# Patient Record
Sex: Female | Born: 1990 | Race: Black or African American | Hispanic: No | Marital: Single | Smoking: Never smoker
Health system: Southern US, Community
[De-identification: ages and names within clinical notes are randomized; demographics above are authoritative.]

## PROBLEM LIST (undated history)

## (undated) ENCOUNTER — Inpatient Hospital Stay (HOSPITAL_COMMUNITY): Payer: Self-pay

## (undated) DIAGNOSIS — I10 Essential (primary) hypertension: Secondary | ICD-10-CM

## (undated) DIAGNOSIS — N9489 Other specified conditions associated with female genital organs and menstrual cycle: Secondary | ICD-10-CM

## (undated) DIAGNOSIS — O09299 Supervision of pregnancy with other poor reproductive or obstetric history, unspecified trimester: Secondary | ICD-10-CM

## (undated) DIAGNOSIS — A5901 Trichomonal vulvovaginitis: Secondary | ICD-10-CM

## (undated) DIAGNOSIS — F419 Anxiety disorder, unspecified: Secondary | ICD-10-CM

## (undated) DIAGNOSIS — Z8719 Personal history of other diseases of the digestive system: Secondary | ICD-10-CM

## (undated) DIAGNOSIS — D649 Anemia, unspecified: Secondary | ICD-10-CM

## (undated) DIAGNOSIS — Z8619 Personal history of other infectious and parasitic diseases: Secondary | ICD-10-CM

## (undated) DIAGNOSIS — N390 Urinary tract infection, site not specified: Secondary | ICD-10-CM

## (undated) DIAGNOSIS — A749 Chlamydial infection, unspecified: Secondary | ICD-10-CM

## (undated) DIAGNOSIS — Z87898 Personal history of other specified conditions: Secondary | ICD-10-CM

## (undated) DIAGNOSIS — N83202 Unspecified ovarian cyst, left side: Secondary | ICD-10-CM

## (undated) HISTORY — DX: Supervision of pregnancy with other poor reproductive or obstetric history, unspecified trimester: O09.299

## (undated) HISTORY — DX: Trichomonal vulvovaginitis: A59.01

## (undated) HISTORY — DX: Personal history of other infectious and parasitic diseases: Z86.19

---
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
## 2008-03-09 ENCOUNTER — Emergency Department (HOSPITAL_COMMUNITY): Admission: EM | Admit: 2008-03-09 | Discharge: 2008-03-09 | Payer: Self-pay | Admitting: Emergency Medicine

## 2008-04-07 ENCOUNTER — Emergency Department (HOSPITAL_COMMUNITY): Admission: EM | Admit: 2008-04-07 | Discharge: 2008-04-08 | Payer: Self-pay | Admitting: Emergency Medicine

## 2009-07-16 ENCOUNTER — Emergency Department (HOSPITAL_COMMUNITY): Admission: EM | Admit: 2009-07-16 | Discharge: 2009-07-16 | Payer: Self-pay | Admitting: Family Medicine

## 2010-05-12 ENCOUNTER — Inpatient Hospital Stay (HOSPITAL_COMMUNITY)
Admission: AD | Admit: 2010-05-12 | Discharge: 2010-05-16 | Payer: Self-pay | Source: Home / Self Care | Admitting: Obstetrics & Gynecology

## 2010-05-25 ENCOUNTER — Ambulatory Visit: Payer: Self-pay | Admitting: Obstetrics & Gynecology

## 2010-06-05 ENCOUNTER — Inpatient Hospital Stay (HOSPITAL_COMMUNITY)
Admission: AD | Admit: 2010-06-05 | Discharge: 2010-06-05 | Payer: Self-pay | Source: Home / Self Care | Attending: Obstetrics & Gynecology | Admitting: Obstetrics & Gynecology

## 2010-07-05 ENCOUNTER — Ambulatory Visit (HOSPITAL_COMMUNITY)
Admission: RE | Admit: 2010-07-05 | Discharge: 2010-07-05 | Payer: Medicaid Other | Source: Home / Self Care | Attending: Family Medicine | Admitting: Family Medicine

## 2010-07-10 ENCOUNTER — Inpatient Hospital Stay (HOSPITAL_COMMUNITY)
Admission: AD | Admit: 2010-07-10 | Discharge: 2010-07-10 | Payer: Self-pay | Source: Home / Self Care | Attending: Obstetrics & Gynecology | Admitting: Obstetrics & Gynecology

## 2010-07-11 LAB — URINALYSIS, ROUTINE W REFLEX MICROSCOPIC
Bilirubin Urine: NEGATIVE
Nitrite: NEGATIVE
Specific Gravity, Urine: 1.015 (ref 1.005–1.030)
Urobilinogen, UA: 0.2 mg/dL (ref 0.0–1.0)

## 2010-07-11 LAB — URINE MICROSCOPIC-ADD ON

## 2010-07-11 LAB — POCT PREGNANCY, URINE: Preg Test, Ur: NEGATIVE

## 2010-07-12 ENCOUNTER — Ambulatory Visit: Admit: 2010-07-12 | Payer: Self-pay | Admitting: Obstetrics and Gynecology

## 2010-08-28 LAB — URINALYSIS, ROUTINE W REFLEX MICROSCOPIC
Glucose, UA: NEGATIVE mg/dL
Ketones, ur: NEGATIVE mg/dL
pH: 6 (ref 5.0–8.0)

## 2010-08-28 LAB — URINE MICROSCOPIC-ADD ON

## 2010-08-28 LAB — CBC
MCH: 23.2 pg — ABNORMAL LOW (ref 26.0–34.0)
MCHC: 30.5 g/dL (ref 30.0–36.0)
Platelets: 538 10*3/uL — ABNORMAL HIGH (ref 150–400)
RBC: 3.71 MIL/uL — ABNORMAL LOW (ref 3.87–5.11)
RDW: 16 % — ABNORMAL HIGH (ref 11.5–15.5)

## 2010-08-28 LAB — COMPREHENSIVE METABOLIC PANEL
AST: 13 U/L (ref 0–37)
Albumin: 3.2 g/dL — ABNORMAL LOW (ref 3.5–5.2)
Calcium: 8.7 mg/dL (ref 8.4–10.5)
Creatinine, Ser: 0.55 mg/dL (ref 0.4–1.2)
GFR calc Af Amer: >60 mL/min (ref >60)
Total Protein: 7.7 g/dL (ref 6.0–8.3)

## 2010-08-28 LAB — DIFFERENTIAL
Eosinophils Relative: 1 % (ref 0–5)
Lymphocytes Relative: 19 % (ref 12–46)
Lymphs Abs: 1.9 10*3/uL (ref 0.7–4.0)
Monocytes Absolute: 0.8 10*3/uL (ref 0.1–1.0)
Neutro Abs: 7.5 10*3/uL (ref 1.7–7.7)

## 2010-08-29 LAB — URINE CULTURE
Colony Count: 60000
Culture  Setup Time: 201111260114

## 2010-08-29 LAB — DIFFERENTIAL
Basophils Relative: 0 % (ref 0–1)
Lymphs Abs: 1.1 10*3/uL (ref 0.7–4.0)
Monocytes Absolute: 1.1 10*3/uL — ABNORMAL HIGH (ref 0.1–1.0)
Monocytes Relative: 9 % (ref 3–12)
Neutro Abs: 10.3 10*3/uL — ABNORMAL HIGH (ref 1.7–7.7)
Neutrophils Relative %: 82 % — ABNORMAL HIGH (ref 43–77)

## 2010-08-29 LAB — URINALYSIS, ROUTINE W REFLEX MICROSCOPIC
Bilirubin Urine: NEGATIVE
Hgb urine dipstick: NEGATIVE
Nitrite: NEGATIVE
Specific Gravity, Urine: 1.025 (ref 1.005–1.030)
pH: 5.5 (ref 5.0–8.0)

## 2010-08-29 LAB — URINE MICROSCOPIC-ADD ON

## 2010-08-29 LAB — GC/CHLAMYDIA PROBE AMP, GENITAL
Chlamydia, DNA Probe: NEGATIVE
GC Probe Amp, Genital: NEGATIVE

## 2010-08-29 LAB — CBC
HCT: 28.8 % — ABNORMAL LOW (ref 36.0–46.0)
Hemoglobin: 9.1 g/dL — ABNORMAL LOW (ref 12.0–15.0)
MCHC: 31.6 g/dL (ref 30.0–36.0)
RBC: 3.67 MIL/uL — ABNORMAL LOW (ref 3.87–5.11)

## 2010-08-29 LAB — WET PREP, GENITAL: Trich, Wet Prep: NONE SEEN

## 2010-09-03 LAB — POCT URINALYSIS DIP (DEVICE)
Glucose, UA: NEGATIVE mg/dL
Nitrite: NEGATIVE
Protein, ur: 30 mg/dL — AB
Urobilinogen, UA: 0.2 mg/dL (ref 0.0–1.0)

## 2010-09-03 LAB — URINE CULTURE

## 2010-09-18 ENCOUNTER — Ambulatory Visit: Payer: Self-pay | Admitting: Occupational Therapy

## 2010-11-03 NOTE — H&P (Signed)
NAME:  Amy Mclean, HARRAL             ACCOUNT NO.:  [id number]   MEDICAL RECORD NO.:  [id number]          PATIENT TYPE:  INP   LOCATION:  9166                          FACILITY:  [facility]   PHYSICIAN:  Hal Morales, M.D.DATE OF BIRTH:  November 30, 1990   DATE OF ADMISSION:  04/27/2004  DATE OF DISCHARGE:                                HISTORY & PHYSICAL   HISTORY:  This is a 20 year old gravida 1 para 0 at 40-5/7 weeks who  presents from the office with reported variable decelerations on the monitor  there.  She reports positive fetal movement. She does not feel her  contractions as painful.  She reports no bleeding or leaking.  Pregnancy has  been followed by the nurse midwife service and remarkable for (1)  adolescent, (2) unsure LMP, (3) late to care, (4) Chlamydia, (5) group B  strep negative.   OBSTETRICAL HISTORY:  Patient is a primigravida.   MEDICAL HISTORY:  Childhood varicella.   SURGICAL HISTORY:  Negative.   FAMILY HISTORY:  Grandfather with hypertension and diabetes.  Grandmother  with arthritis.   GENETIC HISTORY:  Genetic history is remarkable for patient's age of 80 and  father of the baby's age of 75.   SOCIAL HISTORY:  Patient is single.  Father of the baby is not involved.  Mother and aunt and sister are present.  She is of the [employer].  She  denies any alcohol, tobacco, or drug use.  She does not work.  She goes to  homebound school.   HISTORY OF CURRENT PREGNANCY:  Patient entered care at [redacted] weeks gestation.  She was diagnosed with Chlamydia at that time and treated with Zithromax.  She had a negative test of cure.  Ultrasound was normal.  She had another  ultrasound late in pregnancy for size less than dates at 38 weeks and growth  was 49-52% with an AFI of 13.9.   OBJECTIVE DATA:  VITAL SIGNS:  Stable, afebrile.  HEENT:  Within normal limits.  THYROID:  Normal, not enlarged.  CHEST:  Clear to auscultation.  HEART RATE:  Regular rate and rhythm.  ABDOMEN:  Gravid at 39 cm, vertex to Leopold's and vertex to bedside  ultrasound.  EFM shows fetal heart rate of 135 with accelerations to 140-  150, nonreactive by criteria, variable decelerations occurred x2 to 95-105  spontaneously, there are contractions q.2-65m. which are mild.  CERVICAL:  Cervical exam is extremely difficult and is questionably closed,  50% effaced, bedside ultrasound shows a vertex ROA to ROT position with  positive fetal movement.  EXTREMITIES:  Within normal limits.   ASSESSMENT:  1.  Intrauterine pregnancy at 40-5/7 weeks.  2.  Prodromal contractions.  3.  Variable decelerations.   PLAN:  Discuss with Dr. Pennie Rushing.  We will admit the patient to birthing  suites.  Routine CNM orders and Pitocin induction was recommended with a  review of the risks and benefits with the patient and her family.  They  agree to proceed.     Mari   MLW/MEDQ  D:  04/27/2004  T:  04/27/2004  Job:  [id number]

## 2010-11-03 NOTE — Discharge Summary (Signed)
NAME:  Amy Mclean, Amy Mclean NO.:  [id number]   MEDICAL RECORD NO.:  [id number]          PATIENT TYPE:  INP   LOCATION:  9122                          [facility]ACILITY:  [facility]   PHYSICIAN:  Janine Limbo, M.D.DATE OF BIRTH:  05/14/1991   DATE OF ADMISSION:  04/27/2004  DATE OF DISCHARGE:  04/30/2004                                 DISCHARGE SUMMARY   ADMISSION DIAGNOSES:  1.  Intrauterine pregnancy at term.  2.  Prodromal labor.  3.  Fetal heart rate with variable decelerations.   PREOPERATIVE DIAGNOSES:  Nonreassuring fetal heart rate tracing.   PROCEDURE:  Vacuum assisted vaginal delivery and repair of midline  episiotomy.   DISCHARGE DIAGNOSES:  1.  Intrauterine pregnancy at term.  2.  Prodromal labor.  3.  Fetal heart rate with variable decelerations.  4.  Leg cord.   [name] is a 20 year old, gravida 1, para 0 who presented to [facility] at 40 5/7 weeks from the office of [facility] with variable decelerations  on the NST monitor there. The patient was contracting approximately every 2-  6 minutes but was not uncomfortable on admission.  Fetal heart rate remained  nonreactive with good variability, occasional decelerations throughout her  labor.  Epidural was placed when contractions became uncomfortable and  artificial rupture of membranes was  employed for light meconium stained  amniotic fluid, internal monitoring was initiated.  Pitocin had been turned  off secondary to nonreassuring fetal heart rate; however, at this point, the  patient was in the active phase of labor and continued to dilate. She became  complete at which point in light of fetal heart rate tracing, vacuum  assisted vaginal delivery was offered and accepted by the patient.  Vacuum  assistance was performed by Dr. Dierdre Forth, see operative delivery  report.  Birth was accomplished of a 7 pound 3 ounce female infant names  Jess Barters with Apgar scores of 9 at 1 minute and 9 at 5 minutes.  The patient  has done well in the postpartum period. Her hemostasis on the first  postpartum day is 9.0, her vital signs have remained stable, she is afebrile  and on this her second postpartum day she is judged to be in satisfactory  condition for discharge.  Discharge instructions per [facility]  handout.   DISCHARGE MEDICATIONS:  Motrin in suspension form as patient cannot swallow  pills. She will continue Flintstone vitamins 2 a day and will receive a Depo-  Provera 150 mg IM prior to discharge.   FOLLOW UP:  Her discharge followup will be at [facility] in six weeks.     Pecolia Ades  SDM/MEDQ  D:  04/30/2004  T:  05/01/2004  Job:  [id number]

## 2011-01-03 ENCOUNTER — Ambulatory Visit: Payer: Self-pay | Admitting: Obstetrics & Gynecology

## 2011-03-19 LAB — URINE CULTURE

## 2011-03-19 LAB — URINE MICROSCOPIC-ADD ON

## 2011-03-19 LAB — URINALYSIS, ROUTINE W REFLEX MICROSCOPIC
Glucose, UA: NEGATIVE
Hgb urine dipstick: NEGATIVE
Specific Gravity, Urine: 1.029

## 2011-12-20 IMAGING — US US PELVIS COMPLETE MODIFY
1 series · 13 of 25 positions shown · non-contrast
Comparison: None.

CLINICAL DATA: Pelvic pain and fever.  History of PID.  Negative
pregnancy test.



[Series 1: us pelvis complete modify · 0.24mm/px · 91 acquisitions, 13 frames shown]
[im 1/91]
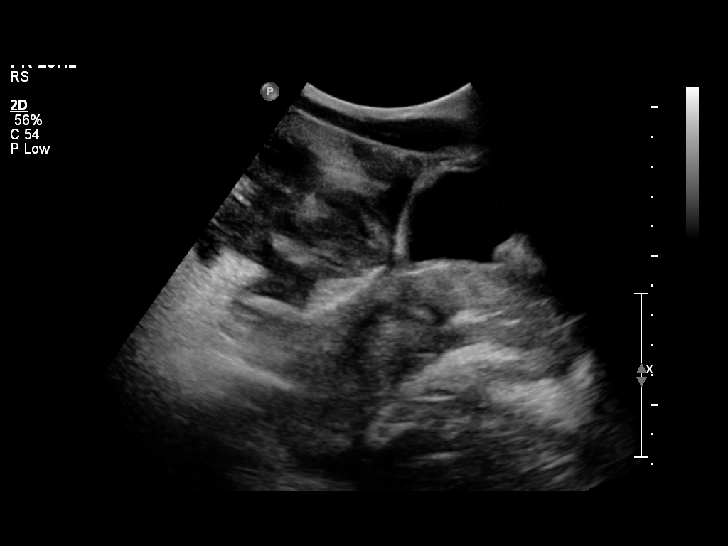
[im 8/91]
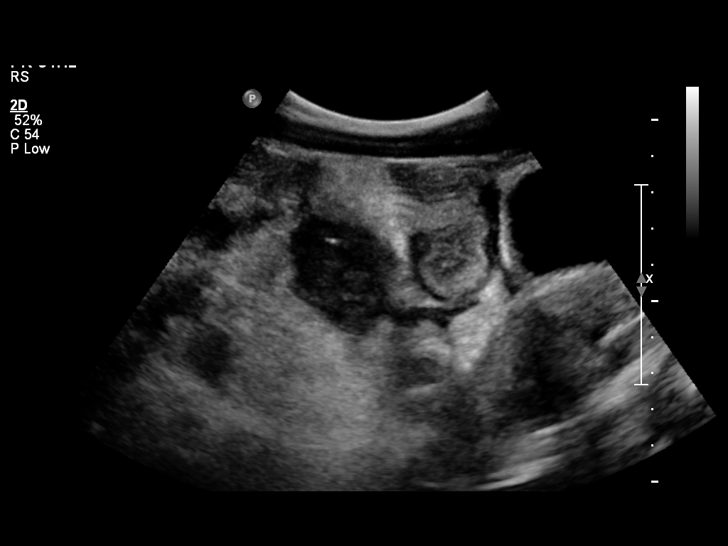
[im 16/91]
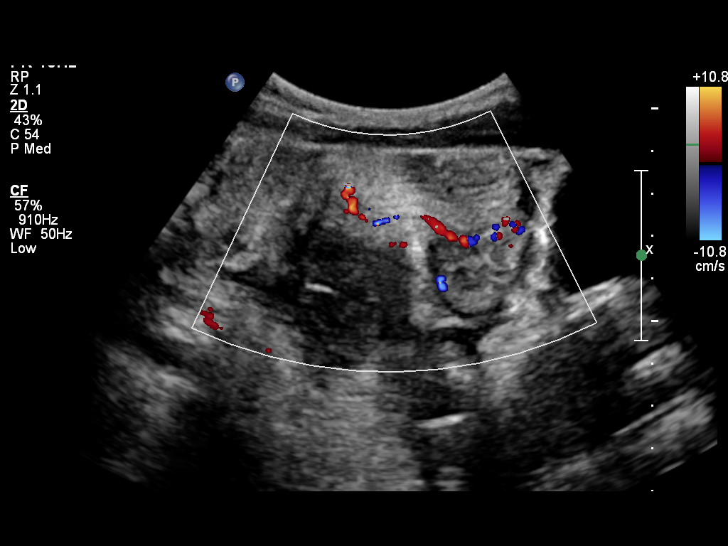
[im 23/91]
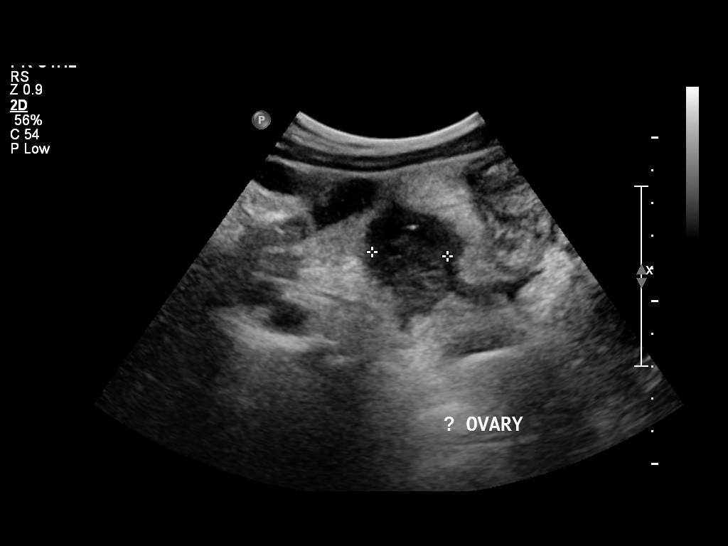
[im 31/91]
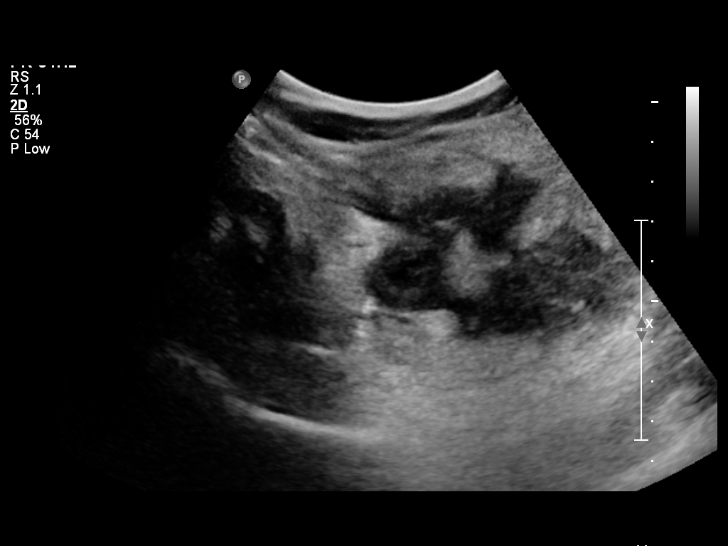
[im 38/91]
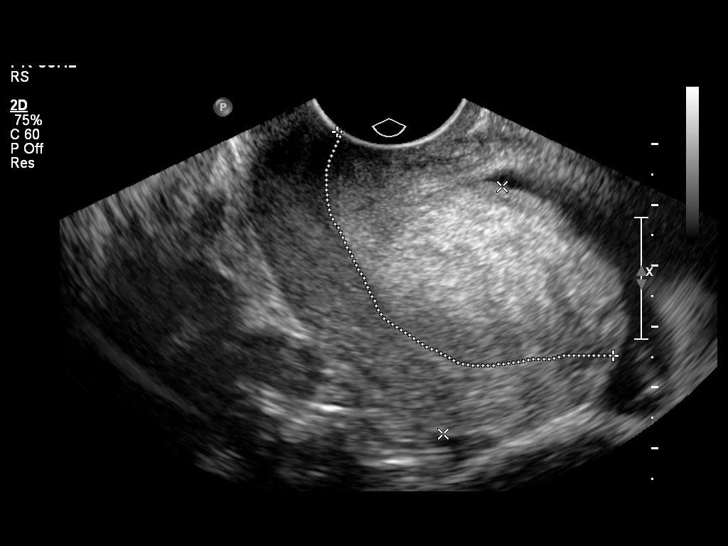
[im 46/91]
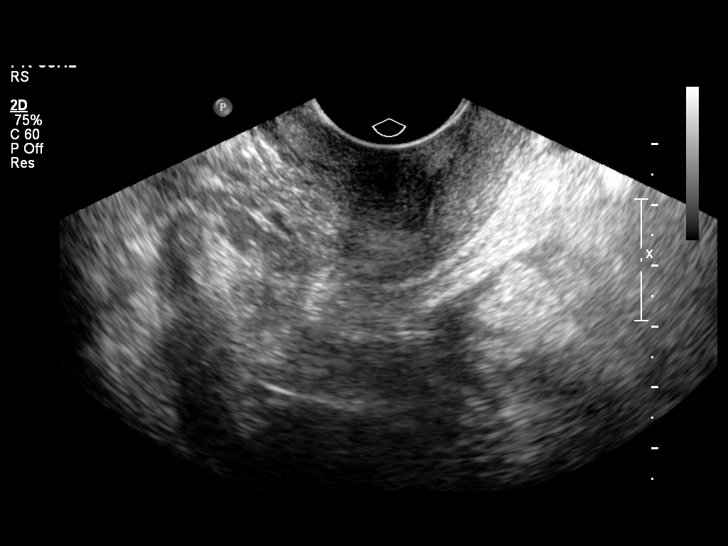
[im 53/91]
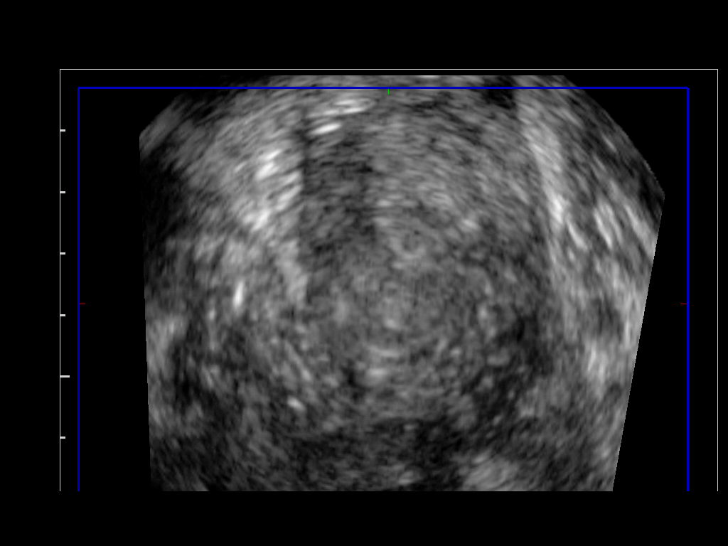
[im 61/91]
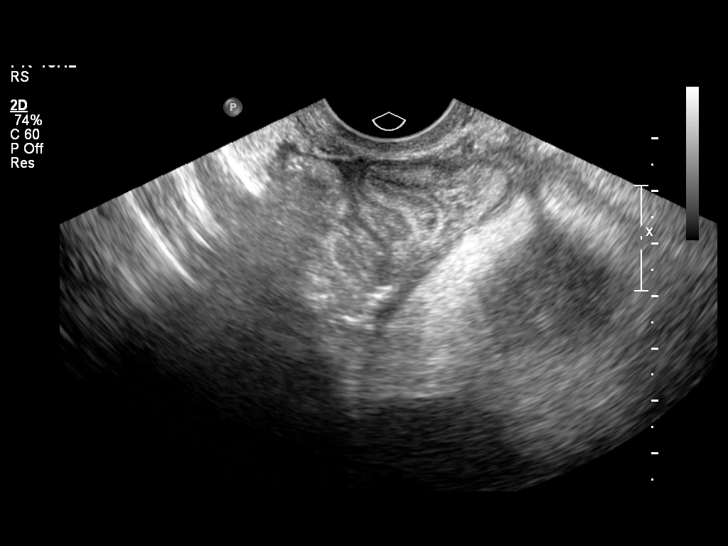
[im 68/91]
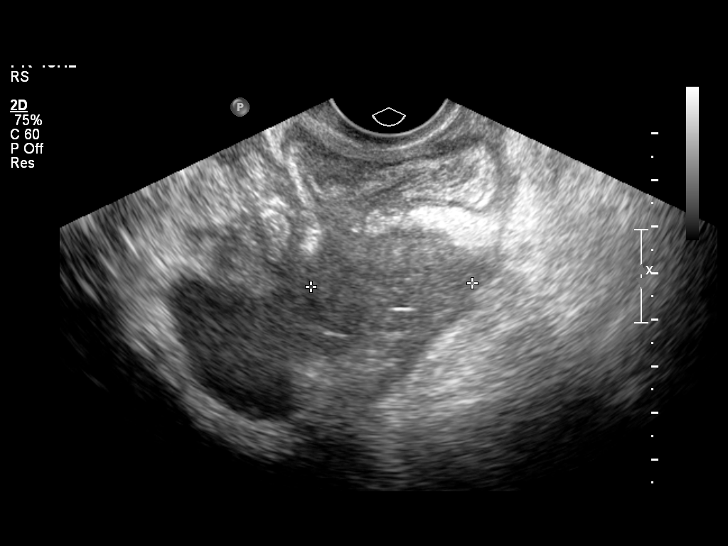
[im 76/91]
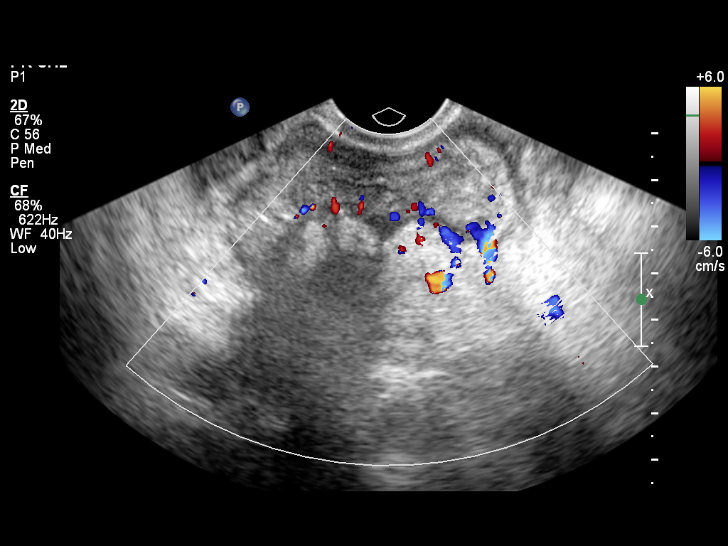
[im 83/91]
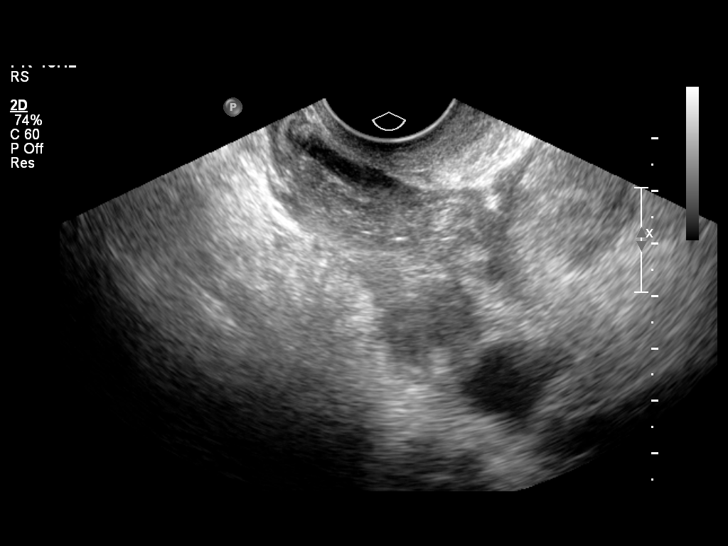
[im 91/91]
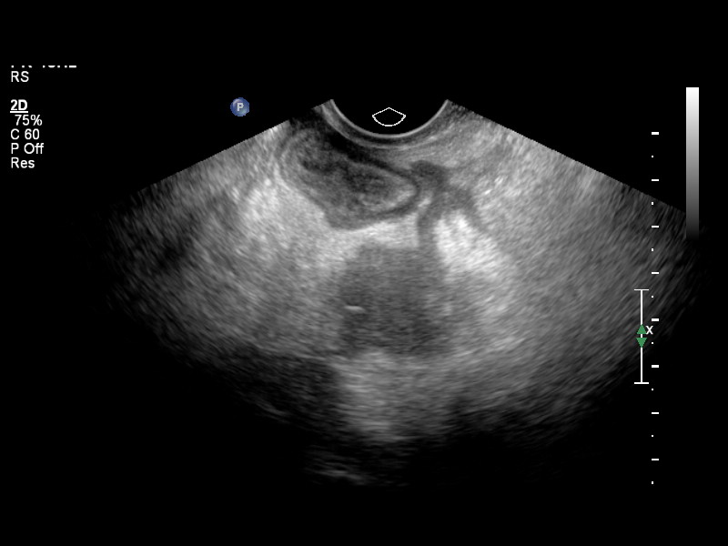

[13 of 25 positions shown; findings below may reference images not displayed]

FINDINGS: Uterus 7.4 x 4.2 x 5.1 cm.  No focal abnormality.

Endometrium 6.4 mm

Right Ovary not visualized

Left Ovary 3.1 x 1.6 x 2.6 cm.

Other Findings:  Complex cystic mass fills much of the pelvis.
This measures 11 x 6.6 x 12 cm and is located above the uterus.
The mass is cystic with multiple areas of increased echogenicity.
This is most likely an abscess related to PID, given the clinical
history.  If this does not respond to antibiotics, follow-up CT
scanning may be helpful.  Underlying neoplasm is considered less
likely given the history of fever.
IMPRESSION: Large complex cystic mass fills much of the pelvis.  This most
likely is an abscess related to pelvic inflammatory disease.
Neoplasm considered less likely.

## 2012-01-10 ENCOUNTER — Encounter (HOSPITAL_COMMUNITY): Payer: Self-pay | Admitting: *Deleted

## 2012-01-10 ENCOUNTER — Emergency Department (HOSPITAL_COMMUNITY): Payer: Self-pay

## 2012-01-10 ENCOUNTER — Emergency Department (HOSPITAL_COMMUNITY)
Admission: EM | Admit: 2012-01-10 | Discharge: 2012-01-10 | Disposition: A | Discharge status: Home / Self Care | Payer: Self-pay | Attending: Emergency Medicine | Admitting: Emergency Medicine

## 2012-01-10 DIAGNOSIS — Y92009 Unspecified place in unspecified non-institutional (private) residence as the place of occurrence of the external cause: Secondary | ICD-10-CM | POA: Insufficient documentation

## 2012-01-10 DIAGNOSIS — W08XXXA Fall from other furniture, initial encounter: Secondary | ICD-10-CM | POA: Insufficient documentation

## 2012-01-10 DIAGNOSIS — S93409A Sprain of unspecified ligament of unspecified ankle, initial encounter: Secondary | ICD-10-CM | POA: Insufficient documentation

## 2012-01-10 MED ORDER — IBUPROFEN 800 MG PO TABS
800.0000 mg | ORAL_TABLET | Freq: Once | ORAL | Status: AC
  Administered 2012-01-10: 800 mg via ORAL
  Filled 2012-01-10: qty 1

## 2012-01-10 NOTE — ED Provider Notes (Signed)
History     CSN: [id number]  Arrival date & time 01/10/12  2150   First MD Initiated Contact with Patient 01/10/12 2302      Chief Complaint  Patient presents with  . Ankle Pain    (Consider location/radiation/quality/duration/timing/severity/associated sxs/prior treatment) Patient is a 21 y.o. female presenting with ankle pain. The history is provided by the patient.  Ankle Pain  Pertinent negatives include no numbness.   Patient presents to emergency department complaining of left ankle injury one week ago stating that she was jumping off the couch and twisted her ankle. Patient states that she initially had some swelling that has been waxing and waning over last week. Patient states that pain is aggravated by prolonged standing or walking and will improve with rest. Patient states that the more she walks throughout the week the more the swelling and pain increases. Patient is complaining of pain in the lateral aspect of right ankle. Patient has taken nothing for pain prior to arrival. Patient denies additional injury. Patient states she has no known medical problems takes no medicines on regular basis she denies extremity numbness/tingling/or weakness. History reviewed. No pertinent past medical history.  History reviewed. No pertinent past surgical history.  No family history on file.  History  Substance Use Topics  . Smoking status: Never Smoker   . Smokeless tobacco: Not on file  . Alcohol Use: No    OB History    Grav Para Term Preterm Abortions TAB SAB Ect Mult Living                  Review of Systems  Constitutional: Negative for fever.  Musculoskeletal: Positive for joint swelling and arthralgias.  Skin: Negative for color change and wound.  Neurological: Negative for weakness and numbness.    Allergies  Review of patient's allergies indicates no known allergies.  Home Medications  No current outpatient prescriptions on file.  BP 114/70  Pulse 98  Temp  98.3 F (36.8 C)  Resp 20  SpO2 100%  LMP 12/21/2011  Physical Exam  Constitutional: She is oriented to person, place, and time. She appears well-developed and well-nourished. No distress.  HENT:  Head: Normocephalic and atraumatic.  Eyes: Conjunctivae are normal.  Cardiovascular: Normal rate and regular rhythm.   Pulmonary/Chest: Effort normal.  Musculoskeletal: She exhibits edema and tenderness.       Right ankle: She exhibits swelling. tenderness.       Swelling and TTP of left lateral ankle but no TTP or swelling of fore foot or calf. No break in skin. Good pedal pulse and cap refill of all toes. Wiggling toes without difficulty.   Neurological: She is alert and oriented to person, place, and time.       Normal sensation of entire foot.   Skin: Skin is warm and dry. No rash noted. She is not diaphoretic. No erythema. No pallor.  Psychiatric: She has a normal mood and affect. Her behavior is normal.    ED Course  Procedures (including critical care time)  PO ibuprofen.   ASO and crutches.   Labs Reviewed - No data to display Dg Ankle Complete Left  01/10/2012  *RADIOLOGY REPORT*  Clinical Data: Ankle pain since injury last weekend.  LEFT ANKLE COMPLETE - 3+ VIEW  Comparison: None.  Findings: The mineralization and alignment are normal.  There is no evidence of acute fracture or dislocation.  No soft tissue abnormalities are identified.  IMPRESSION: No acute osseous findings.  Original Report  Authenticated By: Gerrianne Scale, M.D.     1. Ankle sprain       MDM  Swelling and TTP of left lateral ankle but no TTP or swelling of fore foot or calf. No break in skin. Good pedal pulse and cap refill of all toes. Wiggling toes without difficulty.  No acute findings on xray.          [place], [place] 01/10/12 [phone]

## 2012-01-10 NOTE — ED Provider Notes (Signed)
Medical screening examination/treatment/procedure(s) were performed by non-physician practitioner and as supervising physician I was immediately available for consultation/collaboration.   Benny Lennert, MD 01/10/12 [phone]

## 2012-01-10 NOTE — ED Notes (Signed)
Pt hurt left ankle last wk; increased pain with ambulation/swelling x 2 days

## 2012-02-02 ENCOUNTER — Encounter (HOSPITAL_COMMUNITY): Payer: Self-pay | Admitting: *Deleted

## 2012-02-02 ENCOUNTER — Emergency Department (HOSPITAL_COMMUNITY)
Admission: EM | Admit: 2012-02-02 | Discharge: 2012-02-02 | Disposition: A | Discharge status: Home / Self Care | Payer: Self-pay | Attending: Emergency Medicine | Admitting: Emergency Medicine

## 2012-02-02 DIAGNOSIS — H18829 Corneal disorder due to contact lens, unspecified eye: Secondary | ICD-10-CM | POA: Insufficient documentation

## 2012-02-02 MED ORDER — TETRACAINE HCL 0.5 % OP SOLN
1.0000 [drp] | Freq: Once | OPHTHALMIC | Status: AC
  Administered 2012-02-02: 1 [drp] via OPHTHALMIC
  Filled 2012-02-02: qty 2

## 2012-02-02 MED ORDER — FLUORESCEIN SODIUM 1 MG OP STRP
1.0000 | ORAL_STRIP | Freq: Once | OPHTHALMIC | Status: AC
  Administered 2012-02-02: 1 via OPHTHALMIC
  Filled 2012-02-02: qty 1

## 2012-02-02 NOTE — ED Notes (Signed)
Pt reports redness and irritation to left eye. Pt reports initially her right eye was red and irritated, but has since improved.  Pt reports discharge from left eye and eye is crusted over in AM when she gets up.  Pt denies vision changes.

## 2012-02-02 NOTE — ED Provider Notes (Signed)
History     CSN: [id number]  Arrival date & time 02/02/12  1751   First MD Initiated Contact with Patient 02/02/12 2107      Chief Complaint  Patient presents with  . Conjunctivitis    (Consider location/radiation/quality/duration/timing/severity/associated sxs/prior treatment) HPI Comments: Pt is a contact lens wearer that presents to the ER w cc of left eye irritation. She states that she left her contacts in her eyes x 1 mo with out changing them bc she "kept forgetting." She has been using OTC clear eyes for the irritation which has helped her right eye, but not the left. Associated s/s include blurred vision, injected sclera, FB sensation, clear eye tearing, photophobia, and eye crusting in the morning. No recent trauma, fever, NS or chills.    Patient is a 21 y.o. female presenting with conjunctivitis. The history is provided by the patient.  Conjunctivitis  Associated symptoms include eye itching, photophobia and eye redness. Pertinent negatives include no fever, no abdominal pain, no congestion and no headaches.    History reviewed. No pertinent past medical history.  History reviewed. No pertinent past surgical history.  No family history on file.  History  Substance Use Topics  . Smoking status: Never Smoker   . Smokeless tobacco: Not on file  . Alcohol Use: No    OB History    Grav Para Term Preterm Abortions TAB SAB Ect Mult Living                  Review of Systems  Constitutional: Negative for fever, chills and appetite change.  HENT: Negative for congestion.   Eyes: Positive for photophobia, redness, itching and visual disturbance.  Respiratory: Negative for shortness of breath.   Cardiovascular: Negative for chest pain and leg swelling.  Gastrointestinal: Negative for abdominal pain.  Genitourinary: Negative for dysuria, urgency and frequency.  Neurological: Negative for dizziness, syncope, weakness, light-headedness, numbness and headaches.    Psychiatric/Behavioral: Negative for confusion.  All other systems reviewed and are negative.    Allergies  Review of patient's allergies indicates no known allergies.  Home Medications  No current outpatient prescriptions on file.  BP 111/72  Pulse 86  Temp 98.4 F (36.9 C) (Oral)  Resp 16  Ht 5\' 7"  (1.702 m)  Wt 130 lb (58.968 kg)  BMI 20.36 kg/m2  SpO2 100%  LMP 01/14/2012  Physical Exam  Nursing note and vitals reviewed. Constitutional: She is oriented to person, place, and time. She appears well-developed and well-nourished. No distress.  HENT:  Head: Normocephalic and atraumatic.  Eyes: Conjunctivae and EOM are normal. Pupils are equal, round, and reactive to light. No foreign body present in the left eye.       No tenderness to palpation over temporal arteries or orbital region. Pain free EOMs, visual acuity equal bilaterally (20/50-not wearing glasses), no increase in IOPs, no proptosis, lid swelling, hyphema, purulent discharge from eyes, or consensual photophobia.  Eyelids everted, no evidence of FB.  Fluorescein study: 3mm corneal uptake, no dendritic pattern  Neck: Normal range of motion. Neck supple. Normal carotid pulses and no JVD present. Carotid bruit is not present. No rigidity. Normal range of motion present.  Cardiovascular: Normal rate, regular rhythm, S1 normal, S2 normal, normal heart sounds, intact distal pulses and normal pulses.  Exam reveals no gallop and no friction rub.   No murmur heard. Pulmonary/Chest: Effort normal and breath sounds normal. No accessory muscle usage or stridor. No respiratory distress. She exhibits no tenderness and  no bony tenderness.  Abdominal: Bowel sounds are normal.          Neurological: She is alert and oriented to person, place, and time.  Skin: Skin is warm, dry and intact. No rash noted. She is not diaphoretic. No cyanosis. Nails show no clubbing.  Psychiatric: Her behavior is normal.    ED Course  Procedures  (including critical care time)  Labs Reviewed - No data to display No results found.   No diagnosis found.    MDM  Contact Corneal abrasion Pt with corneal abrasion on PE. Tdap not given, UTD. Eye irrigated w NS, no evidence of FB.  No change in vision, acuity equal bilaterally.  Pt is a contact lens wearer.  Exam non-concerning for orbital cellulitis, hyphema, corneal ulcers. Patient will be discharged home with tobramycin. Patient understands to follow up with ophthalmology (will call first thing tmw am), & to return to ER if new symptoms develop including change in vision, purulent drainage, or entrapment.          Jaci Carrel, [place] 02/02/12 2230

## 2012-02-02 NOTE — ED Notes (Signed)
Pt c/o L eye tenderness and and redness. Pt states she left contact lenses in for 1-2 months without removing them, pt states 5 days ago, drainage and redness began.

## 2012-02-02 NOTE — ED Notes (Signed)
Pt came to triage window and wanted to know if we had called her, I informed her yes,  She said she was in her car charging her cell phone

## 2012-02-02 NOTE — ED Notes (Signed)
Called to triage room x 1,  Went outside and called also,  No answer

## 2012-02-02 NOTE — ED Provider Notes (Signed)
Medical screening examination/treatment/procedure(s) were performed by non-physician practitioner and as supervising physician I was immediately available for consultation/collaboration.   Corianne Buccellato Y. Quinzell Malcomb, MD 02/02/12 2309 

## 2012-07-10 ENCOUNTER — Encounter (HOSPITAL_COMMUNITY): Payer: Self-pay | Admitting: *Deleted

## 2012-07-10 ENCOUNTER — Inpatient Hospital Stay (HOSPITAL_COMMUNITY)
Admission: AD | Admit: 2012-07-10 | Discharge: 2012-07-11 | Disposition: A | Discharge status: Home / Self Care | Payer: BC Managed Care – PPO | Source: Ambulatory Visit | Attending: Obstetrics & Gynecology | Admitting: Obstetrics & Gynecology

## 2012-07-10 DIAGNOSIS — A499 Bacterial infection, unspecified: Secondary | ICD-10-CM | POA: Insufficient documentation

## 2012-07-10 DIAGNOSIS — B9689 Other specified bacterial agents as the cause of diseases classified elsewhere: Secondary | ICD-10-CM | POA: Insufficient documentation

## 2012-07-10 DIAGNOSIS — B3731 Acute candidiasis of vulva and vagina: Secondary | ICD-10-CM | POA: Insufficient documentation

## 2012-07-10 DIAGNOSIS — R109 Unspecified abdominal pain: Secondary | ICD-10-CM | POA: Insufficient documentation

## 2012-07-10 DIAGNOSIS — N390 Urinary tract infection, site not specified: Secondary | ICD-10-CM | POA: Insufficient documentation

## 2012-07-10 DIAGNOSIS — B373 Candidiasis of vulva and vagina: Secondary | ICD-10-CM | POA: Insufficient documentation

## 2012-07-10 DIAGNOSIS — N760 Acute vaginitis: Secondary | ICD-10-CM | POA: Insufficient documentation

## 2012-07-10 LAB — URINALYSIS, ROUTINE W REFLEX MICROSCOPIC
Bilirubin Urine: NEGATIVE
Glucose, UA: NEGATIVE mg/dL
Hgb urine dipstick: NEGATIVE
Specific Gravity, Urine: >1.03 — ABNORMAL HIGH (ref 1.005–1.030)
pH: 6 (ref 5.0–8.0)

## 2012-07-10 LAB — WET PREP, GENITAL: Trich, Wet Prep: NONE SEEN

## 2012-07-10 LAB — URINE MICROSCOPIC-ADD ON

## 2012-07-10 MED ORDER — SULFAMETHOXAZOLE-TRIMETHOPRIM 800-160 MG PO TABS: 1.0000 | ORAL_TABLET | Freq: Two times a day (BID) | ORAL | Status: DC

## 2012-07-10 MED ORDER — FLUCONAZOLE 150 MG PO TABS
150.0000 mg | ORAL_TABLET | ORAL | Status: AC
  Administered 2012-07-10: 150 mg via ORAL
  Filled 2012-07-10: qty 1

## 2012-07-10 MED ORDER — FLUCONAZOLE 150 MG PO TABS: ORAL_TABLET | ORAL | Status: DC

## 2012-07-10 NOTE — Progress Notes (Signed)
Pt informed that NP would be in shortly.

## 2012-07-10 NOTE — MAU Note (Signed)
Pt states when she passes urine it has an odor to it, and pee is cloudy.

## 2012-07-11 LAB — GC/CHLAMYDIA PROBE AMP: CT Probe RNA: NEGATIVE

## 2012-07-11 MED ORDER — METRONIDAZOLE 500 MG PO TABS: 500.0000 mg | ORAL_TABLET | Freq: Two times a day (BID) | ORAL | Status: DC

## 2012-07-12 LAB — URINE CULTURE

## 2012-07-14 ENCOUNTER — Inpatient Hospital Stay (HOSPITAL_COMMUNITY)
Admission: AD | Admit: 2012-07-14 | Discharge: 2012-07-14 | Disposition: A | Discharge status: Home / Self Care | Payer: BC Managed Care – PPO | Source: Ambulatory Visit | Attending: Obstetrics & Gynecology | Admitting: Obstetrics & Gynecology

## 2012-07-14 DIAGNOSIS — N760 Acute vaginitis: Secondary | ICD-10-CM | POA: Insufficient documentation

## 2012-07-14 DIAGNOSIS — A499 Bacterial infection, unspecified: Secondary | ICD-10-CM | POA: Insufficient documentation

## 2012-07-14 DIAGNOSIS — N390 Urinary tract infection, site not specified: Secondary | ICD-10-CM | POA: Insufficient documentation

## 2012-07-14 DIAGNOSIS — R1013 Epigastric pain: Secondary | ICD-10-CM | POA: Insufficient documentation

## 2012-07-14 DIAGNOSIS — B9689 Other specified bacterial agents as the cause of diseases classified elsewhere: Secondary | ICD-10-CM | POA: Insufficient documentation

## 2012-07-14 MED ORDER — PROMETHAZINE HCL 12.5 MG PO TABS: 12.5000 mg | ORAL_TABLET | Freq: Four times a day (QID) | ORAL | Status: DC | PRN

## 2012-07-14 NOTE — MAU Note (Signed)
Pt was seen in triage area by D.Poe,CNM-pt was instructed on a change in meds with verbalized understnading-denies need of further eval.-pt was asked to wait while I got D.C instructions-pt left

## 2012-07-14 NOTE — MAU Provider Note (Signed)
Attestation of Attending Supervision of Advanced Practitioner (CNM/NP): Evaluation and management procedures were performed by the Advanced Practitioner under my supervision and collaboration. I have reviewed the Advanced Practitioner's note and chart, and I agree with the management and plan.  Phoenyx Melka H. 10:44 PM   

## 2012-07-14 NOTE — MAU Provider Note (Signed)
CC: Abdominal Pain       HPI Amy Mclean is a 22 y.o. [id number] who has experienced nausea and epigastric pain each time after taking her Bactrim for UTI dx here 07/10/12. Denies vomiting. Has mild crampy lower abdominal pains, but her UTI symptoms are otherwise resolved and she has retained all 3 tablets of Bactrim she has taken. E. Coli was pan- sensitive. Also was given RX of Flagyl for BV, just filled today.  No fever, chills, backache. Denies irritative vaginal discharge.   Past Medical History  Diagnosis Date  . No pertinent past medical history     OB History    Grav Para Term Preterm Abortions TAB SAB Ect Mult Living   2 2 2  0 0 0 0 0 0 2     # Outc Date GA Lbr Len/2nd Wgt Sex Del Anes PTL Lv   1 TRM            2 TRM               Past Surgical History  Procedure Date  . No past surgeries     History   Social History  . Marital Status: Single    Spouse Name: N/A    Number of Children: N/A  . Years of Education: N/A   Occupational History  . Not on file.   Social History Main Topics  . Smoking status: Never Smoker   . Smokeless tobacco: Not on file  . Alcohol Use: No  . Drug Use: No  . Sexually Active:    Other Topics Concern  . Not on file   Social History Narrative  . No narrative on file    No current facility-administered medications on file prior to encounter.   Current Outpatient Prescriptions on File Prior to Encounter  Medication Sig Dispense Refill  . fluconazole (DIFLUCAN) 150 MG tablet Take 1 tablet 3 days or more from your first dose given in MAU.  1 tablet  0  . metroNIDAZOLE (FLAGYL) 500 MG tablet Take 1 tablet (500 mg total) by mouth 2 (two) times daily.  14 tablet  0  . sulfamethoxazole-trimethoprim (BACTRIM DS,SEPTRA DS) 800-160 MG per tablet Take 1 tablet by mouth 2 (two) times daily.  6 tablet  0    No Known Allergies  ROS Pertinent items in HPI  PHYSICAL EXAM Filed Vitals:   07/14/12 2216  BP: 114/69  Pulse: 96    Temp: 98.2 F (36.8 C)  Resp: 20   General: Well nourished, well developed female in no acute distress Cardiovascular: Normal rate Respiratory: Normal effort Abdomen: Soft, nontender Back: No CVAT Extremities: No edema Neurologic: Alert and oriented ASSESSMENT  1. UTI (lower urinary tract infection)   2. BV (bacterial vaginosis)     PLAN Discharge home. See AVS for patient education. Advised to take the Phenergan 30 min before the Bactrim. Hold off on the Flagyl until after her 3 day course of Bactrim. Follow at [facility] to get started on contraception and further evaluation if abdominal pain does not resolve after antibiotics and next menstrual period.  Follow-up Information    Schedule an appointment as soon as possible for a visit with [facility] [place]. (As needed)    Contact information:   [address] [place] [address] [phone]       lliculitis    Medication List     As of 07/14/2012 10:38 PM    TAKE these medications  fluconazole 150 MG tablet   Commonly known as: DIFLUCAN   Take 1 tablet 3 days or more from your first dose given in MAU.      metroNIDAZOLE 500 MG tablet   Commonly known as: FLAGYL   Take 1 tablet (500 mg total) by mouth 2 (two) times daily.      promethazine 12.5 MG tablet   Commonly known as: PHENERGAN   Take 1 tablet (12.5 mg total) by mouth every 6 (six) hours as needed for nausea.      sulfamethoxazole-trimethoprim 800-160 MG per tablet   Commonly known as: BACTRIM DS,SEPTRA DS   Take 1 tablet by mouth 2 (two) times daily.           Danae Orleans, CNM 07/14/2012 10:23 PM

## 2012-07-14 NOTE — MAU Note (Signed)
Pt states she has been having nausea since taking her antibiotics for a UTI-states she has stomach pains tht have been present sense before the UTI and wants to be evaluated for that pain as well

## 2012-08-25 ENCOUNTER — Encounter (HOSPITAL_COMMUNITY): Payer: Self-pay | Admitting: *Deleted

## 2012-08-25 ENCOUNTER — Emergency Department (HOSPITAL_COMMUNITY)
Admission: EM | Admit: 2012-08-25 | Discharge: 2012-08-26 | Disposition: A | Discharge status: Home / Self Care | Payer: Self-pay | Attending: Emergency Medicine | Admitting: Emergency Medicine

## 2012-08-25 DIAGNOSIS — L03019 Cellulitis of unspecified finger: Secondary | ICD-10-CM | POA: Insufficient documentation

## 2012-08-25 DIAGNOSIS — X58XXXA Exposure to other specified factors, initial encounter: Secondary | ICD-10-CM | POA: Insufficient documentation

## 2012-08-25 DIAGNOSIS — Y929 Unspecified place or not applicable: Secondary | ICD-10-CM | POA: Insufficient documentation

## 2012-08-25 DIAGNOSIS — Y939 Activity, unspecified: Secondary | ICD-10-CM | POA: Insufficient documentation

## 2012-08-25 DIAGNOSIS — IMO0001 Reserved for inherently not codable concepts without codable children: Secondary | ICD-10-CM

## 2012-08-26 MED ORDER — CEPHALEXIN 500 MG PO CAPS: 500.0000 mg | ORAL_CAPSULE | Freq: Four times a day (QID) | ORAL | Status: DC

## 2012-08-26 NOTE — ED Provider Notes (Signed)
History     CSN: [id number]  Arrival date & time 08/25/12  2252   First MD Initiated Contact with Patient 08/25/12 2353      Chief Complaint  Patient presents with  . Finger Injury    (Consider location/radiation/quality/duration/timing/severity/associated sxs/prior treatment) HPI Comments: Patient presenting with acute onset of pain of the left index finger last evening.  No acute injury or trauma.  She noticed a fluid filled blister just lateral to her finger nail today.  No drainage from the area.  No erythema or warmth of the finger.  Skin intact.  No treatment prior to arrival.  She has not taken anything for pain.  She has full ROM of her finger.  No fever or chills.  No numbness or tingling.    The history is provided by the patient.    Past Medical History  Diagnosis Date  . No pertinent past medical history     Past Surgical History  Procedure Laterality Date  . No past surgeries      Family History  Problem Relation Age of Onset  . Alcohol abuse Neg Hx   . Arthritis Neg Hx   . Birth defects Neg Hx   . Asthma Neg Hx   . Cancer Neg Hx   . COPD Neg Hx   . Depression Neg Hx   . Diabetes Neg Hx   . Drug abuse Neg Hx   . Early death Neg Hx   . Hearing loss Neg Hx   . Heart disease Neg Hx   . Hyperlipidemia Neg Hx   . Hypertension Neg Hx   . Kidney disease Neg Hx   . Learning disabilities Neg Hx   . Mental illness Neg Hx   . Mental retardation Neg Hx   . Miscarriages / Stillbirths Neg Hx   . Vision loss Neg Hx   . Stroke Neg Hx     History  Substance Use Topics  . Smoking status: Never Smoker   . Smokeless tobacco: Not on file  . Alcohol Use: No    OB History   Grav Para Term Preterm Abortions TAB SAB Ect Mult Living   2 2 2  0 0 0 0 0 0 2      Review of Systems  Constitutional: Negative for fever and chills.  Musculoskeletal:       Finger pain  Skin:       paronychia  Neurological: Negative for numbness.    Allergies  Review of  patient's allergies indicates no known allergies.  Home Medications  No current outpatient prescriptions on file.  BP 117/80  Pulse 83  Temp(Src) 99.2 F (37.3 C) (Oral)  Resp 16  SpO2 98%  LMP 08/19/2012  Physical Exam  Nursing note and vitals reviewed. Constitutional: She appears well-developed and well-nourished. No distress.  HENT:  Head: Normocephalic and atraumatic.  Mouth/Throat: Oropharynx is clear and moist.  Cardiovascular: Normal rate, regular rhythm and normal heart sounds.   Pulmonary/Chest: Effort normal and breath sounds normal.  Neurological: She is alert. No sensory deficit.  Skin: She is not diaphoretic.  Left index finger paronychia lateral to the finger nail.  No surrounding erythema or warmth  Psychiatric: She has a normal mood and affect.    ED Course  NERVE BLOCK Date/Time: 08/26/2012 1:24 PM Performed by: Anne Shutter, HEATHER Authorized by: Anne Shutter, Herbert Seta Consent: Verbal consent obtained. written consent not obtained. Risks and benefits: risks, benefits and alternatives were discussed Consent given by: patient Patient  understanding: patient states understanding of the procedure being performed Patient consent: the patient's understanding of the procedure matches consent given Procedure consent: procedure consent matches procedure scheduled Patient identity confirmed: verbally with patient Indications: pain relief Nerve block body site: index finger. Laterality: left Patient position: sitting Needle gauge: 25 G Location technique: anatomical landmarks Local anesthetic: lidocaine 1% without epinephrine Outcome: pain improved Patient tolerance: Patient tolerated the procedure well with no immediate complications.   (including critical care time)  Labs Reviewed - No data to display No results found.   No diagnosis found.  INCISION AND DRAINAGE Performed by: Anne Shutter, HEATHER Consent: Verbal consent obtained. Risks and benefits:  risks, benefits and alternatives were discussed Type: paronychia  Body area: left index finger  Anesthesia: digital block  Incision was made with a scalpel.  Local anesthetic: lidocaine 1% without epinephrine  Anesthetic total: 6 ml  Complexity: complex Blunt dissection to break up loculations  Drainage: purulent  Drainage amount: small  Patient tolerance: Patient tolerated the procedure well with no immediate complications.    MDM  Patient with left index finger paronychia.  Digital block performed and paronychia incised and drained.  Patient placed on antibiotic.  Return precautions discussed.        Pascal Lux Austin, PA-C 08/26/12 1329

## 2012-08-28 NOTE — ED Provider Notes (Signed)
Medical screening examination/treatment/procedure(s) were performed by non-physician practitioner and as supervising physician I was immediately available for consultation/collaboration.  Doug Sou, MD 08/28/12 1729

## 2012-10-18 ENCOUNTER — Emergency Department (HOSPITAL_COMMUNITY)
Admission: EM | Admit: 2012-10-18 | Discharge: 2012-10-18 | Disposition: A | Discharge status: Home / Self Care | Payer: Self-pay | Attending: Emergency Medicine | Admitting: Emergency Medicine

## 2012-10-18 DIAGNOSIS — R000 Tachycardia, unspecified: Secondary | ICD-10-CM | POA: Insufficient documentation

## 2012-10-18 DIAGNOSIS — L509 Urticaria, unspecified: Secondary | ICD-10-CM | POA: Insufficient documentation

## 2012-10-18 DIAGNOSIS — R21 Rash and other nonspecific skin eruption: Secondary | ICD-10-CM | POA: Insufficient documentation

## 2012-10-18 MED ORDER — PREDNISONE 20 MG PO TABS
40.0000 mg | ORAL_TABLET | Freq: Once | ORAL | Status: AC
  Administered 2012-10-18: 40 mg via ORAL
  Filled 2012-10-18: qty 2

## 2012-10-18 MED ORDER — PREDNISONE 10 MG PO TABS: 40.0000 mg | ORAL_TABLET | Freq: Every day | ORAL | Status: DC

## 2012-10-18 NOTE — ED Notes (Signed)
Pt comfortable with d/c and f/u instructions. Prescriptions x1 

## 2012-10-18 NOTE — ED Notes (Signed)
Denies difficulty breathing.

## 2012-10-18 NOTE — ED Provider Notes (Signed)
History     CSN: [id number]  Arrival date & time 10/18/12  2031   First MD Initiated Contact with Patient 10/18/12 2259      Chief Complaint  Patient presents with  . Allergic Reaction    (Consider location/radiation/quality/duration/timing/severity/associated sxs/prior treatment) HPI Complains of hives onset yesterday afternoon started on her extremities has moved to her trunk neck and back . Also complained of lips and eyelids swelling. Lips and eyelid swelling has resolved. She is treated herself with Benadryl with partial relief. Denies shortness of breath denies throat swelling denies hoarseness no other associated symptoms. Past Medical History  Diagnosis Date  . No pertinent past medical history     Past Surgical History  Procedure Laterality Date  . No past surgeries        History  Substance Use Topics  . Smoking status: Never Smoker   . Smokeless tobacco: Not on file  . Alcohol Use: No    OB History   Grav Para Term Preterm Abortions TAB SAB Ect Mult Living   2 2 2  0 0 0 0 0 0 2      Review of Systems  Constitutional: Negative.   HENT: Negative.   Respiratory: Negative.   Cardiovascular: Negative.   Gastrointestinal: Negative.   Musculoskeletal: Negative.   Skin: Positive for rash.  Neurological: Negative.   Psychiatric/Behavioral: Negative.   All other systems reviewed and are negative.    Allergies  Review of patient's allergies indicates no known allergies.  Home Medications  Medications none. Patient not on Keflex Current Outpatient Rx  Name  Route  Sig  Dispense  Refill  . cephALEXin (KEFLEX) 500 MG capsule   Oral   Take 1 capsule (500 mg total) by mouth 4 (four) times daily.   28 capsule   0     BP 117/80  Pulse 122  Temp(Src) 99.1 F (37.3 C)  Resp 18  SpO2 100%  Physical Exam  Nursing note and vitals reviewed. Constitutional: She is oriented to person, place, and time. She appears well-developed and well-nourished. No  distress.  HENT:  Head: Normocephalic and atraumatic.  Eyes: Conjunctivae are normal. Pupils are equal, round, and reactive to light.  Neck: Neck supple. No tracheal deviation present. No thyromegaly present.  Cardiovascular: Regular rhythm.   No murmur heard. Mildly tachycardic  Pulmonary/Chest: Effort normal and breath sounds normal.  Abdominal: Soft. Bowel sounds are normal. She exhibits no distension. There is no tenderness.  Musculoskeletal: Normal range of motion. She exhibits no edema and no tenderness.  Neurological: She is alert and oriented to person, place, and time. Coordination normal.  Skin: Skin is warm and dry. No rash noted.  Scant hive-like rash on trunk extremities and back of neck  Psychiatric: She has a normal mood and affect.    ED Course  Procedures (including critical care time)  Labs Reviewed - No data to display No results found.   No diagnosis found.    MDM  Plan prescription prednisone, Benadryl \\Referral  resource guide. Return immediately if throat swelling for his change dyspnea Diagnosis urticaria        Doug Sou, MD 10/18/12 2315

## 2012-10-18 NOTE — ED Notes (Signed)
Pt states that yesterday afternoon she broke out in hives and started itching. She took diphenhydramine and symptoms resolved but today her upper lip and eye lids started swelling. States diphenhydramine 25 mg x 2 last night and 2 today. Denies new cosmetics, fragrances, detergents, no known food allergies.

## 2012-10-18 NOTE — ED Notes (Signed)
EDP at bedside for assessment 

## 2012-10-18 NOTE — ED Notes (Signed)
NURSE FIRST ROUNDS : PT. SITTING WAITING AREA WATCHING TV WITH NO DISTRESS, RESPIRATIONS UNLABORED , NURSE EXPLAINED DELAY / PROCESS .

## 2013-03-10 ENCOUNTER — Emergency Department (HOSPITAL_BASED_OUTPATIENT_CLINIC_OR_DEPARTMENT_OTHER)
Admission: EM | Admit: 2013-03-10 | Discharge: 2013-03-10 | Disposition: A | Discharge status: Home / Self Care | Payer: BC Managed Care – PPO | Attending: Emergency Medicine | Admitting: Emergency Medicine

## 2013-03-10 ENCOUNTER — Encounter (HOSPITAL_BASED_OUTPATIENT_CLINIC_OR_DEPARTMENT_OTHER): Payer: Self-pay

## 2013-03-10 DIAGNOSIS — R6889 Other general symptoms and signs: Secondary | ICD-10-CM | POA: Insufficient documentation

## 2013-03-10 DIAGNOSIS — IMO0002 Reserved for concepts with insufficient information to code with codable children: Secondary | ICD-10-CM | POA: Insufficient documentation

## 2013-03-10 DIAGNOSIS — R0989 Other specified symptoms and signs involving the circulatory and respiratory systems: Secondary | ICD-10-CM

## 2013-03-10 MED ORDER — GI COCKTAIL ~~LOC~~
30.0000 mL | Freq: Once | ORAL | Status: AC
  Administered 2013-03-10: 30 mL via ORAL
  Filled 2013-03-10: qty 30

## 2013-03-10 NOTE — ED Provider Notes (Signed)
CSN: [id number]     Arrival date & time 03/10/13  1334 History   First MD Initiated Contact with Patient 03/10/13 1351     Chief Complaint  Patient presents with  . Swallowed Foreign Body   (Consider location/radiation/quality/duration/timing/severity/associated sxs/prior Treatment) HPI Comments: Patient arrives via EMS after swallowing a piece of hard candy whole. She is sucking on a her mouth and axilla swallowed the whole thing. She feels like her something stuck in her throat. She drank some soda in route without any problems. Denies any difficulty breathing or speaking. No chest pain or shortness of breath. No fever, chills, nausea, vomiting or abdominal pain.  The history is provided by the patient and the EMS personnel.    Past Medical History  Diagnosis Date  . No pertinent past medical history    Past Surgical History  Procedure Laterality Date  . No past surgeries     Family History  Problem Relation Age of Onset  . Alcohol abuse Neg Hx   . Arthritis Neg Hx   . Birth defects Neg Hx   . Asthma Neg Hx   . Cancer Neg Hx   . COPD Neg Hx   . Depression Neg Hx   . Diabetes Neg Hx   . Drug abuse Neg Hx   . Early death Neg Hx   . Hearing loss Neg Hx   . Heart disease Neg Hx   . Hyperlipidemia Neg Hx   . Hypertension Neg Hx   . Kidney disease Neg Hx   . Learning disabilities Neg Hx   . Mental illness Neg Hx   . Mental retardation Neg Hx   . Miscarriages / Stillbirths Neg Hx   . Vision loss Neg Hx   . Stroke Neg Hx    History  Substance Use Topics  . Smoking status: Never Smoker   . Smokeless tobacco: Not on file  . Alcohol Use: No   OB History   Grav Para Term Preterm Abortions TAB SAB Ect Mult Living   2 2 2  0 0 0 0 0 0 2     Review of Systems  Constitutional: Negative for activity change and appetite change.  HENT: Negative for congestion, rhinorrhea and neck pain.   Respiratory: Negative for cough, chest tightness and shortness of breath.    Cardiovascular: Negative for chest pain.  Gastrointestinal: Negative for nausea, vomiting and abdominal pain.  Genitourinary: Negative for dysuria, hematuria, vaginal bleeding and vaginal discharge.  Musculoskeletal: Negative for back pain.  Skin: Negative for rash.  Neurological: Negative for dizziness and headaches.  A complete 10 system review of systems was obtained and all systems are negative except as noted in the HPI and PMH.    Allergies  Review of patient's allergies indicates no known allergies.  Home Medications   Current Outpatient Rx  Name  Route  Sig  Dispense  Refill  . diphenhydrAMINE (BENADRYL) 25 mg capsule   Oral   Take 50 mg by mouth every 6 (six) hours as needed for itching. For itching         . predniSONE (DELTASONE) 10 MG tablet   Oral   Take 4 tablets (40 mg total) by mouth daily.   12 tablet   0    BP 130/81  Pulse 112  Temp(Src) 98.7 F (37.1 C) (Oral)  Resp 16  Ht 5\' 7"  (1.702 m)  Wt 129 lb (58.514 kg)  BMI 20.2 kg/m2  LMP 02/21/2013 Physical Exam  Constitutional: She  is oriented to person, place, and time. She appears well-developed and well-nourished. No distress.  HENT:  Head: Normocephalic and atraumatic.  Mouth/Throat: Oropharynx is clear and moist. No oropharyngeal exudate.  OP clear, no asymmetry Tolerating secretions, speaking in full sentences.  Eyes: Conjunctivae and EOM are normal. Pupils are equal, round, and reactive to light.  Neck: Normal range of motion. Neck supple.  Cardiovascular: Normal rate, regular rhythm and normal heart sounds.   No murmur heard. Pulmonary/Chest: Effort normal and breath sounds normal. No respiratory distress.  Abdominal: Soft. There is no tenderness. There is no rebound and no guarding.  Musculoskeletal: Normal range of motion. She exhibits no edema and no tenderness.  Neurological: She is alert and oriented to person, place, and time. No cranial nerve deficit. She exhibits normal muscle tone.  Coordination normal.  Skin: Skin is warm.    ED Course  Procedures (including critical care time) Labs Review Labs Reviewed - No data to display Imaging Review No results found.  MDM   1. Foreign body sensation in throat    Foreign body sensation in throat after swallowing a piece of hard candy. No distress. Controlling secretions.  Patient given GI cocktail. She is tolerating by mouth fluids in the ED without difficulty. No drooling speaking in full sentences..  No difficulty drinking or eating in ED. Stable for outpatient followup return precautions discussed.Marland Kitchen   Glynn Octave, MD 03/10/13 [phone]

## 2013-03-10 NOTE — ED Notes (Signed)
[facility] report-pt feels a piece of candy is still stuck in throat-pt handling own secretions-drank soda en route w/o vomiting-pt NAD

## 2013-04-15 ENCOUNTER — Encounter (HOSPITAL_COMMUNITY): Payer: Self-pay | Admitting: General Practice

## 2013-04-15 ENCOUNTER — Inpatient Hospital Stay (HOSPITAL_COMMUNITY)
Admission: AD | Admit: 2013-04-15 | Discharge: 2013-04-15 | Disposition: A | Discharge status: Home / Self Care | Payer: BC Managed Care – PPO | Source: Ambulatory Visit | Attending: Obstetrics and Gynecology | Admitting: Obstetrics and Gynecology

## 2013-04-15 DIAGNOSIS — N390 Urinary tract infection, site not specified: Secondary | ICD-10-CM | POA: Insufficient documentation

## 2013-04-15 DIAGNOSIS — N949 Unspecified condition associated with female genital organs and menstrual cycle: Secondary | ICD-10-CM | POA: Insufficient documentation

## 2013-04-15 DIAGNOSIS — B9689 Other specified bacterial agents as the cause of diseases classified elsewhere: Secondary | ICD-10-CM | POA: Insufficient documentation

## 2013-04-15 DIAGNOSIS — A499 Bacterial infection, unspecified: Secondary | ICD-10-CM | POA: Insufficient documentation

## 2013-04-15 DIAGNOSIS — N760 Acute vaginitis: Secondary | ICD-10-CM | POA: Insufficient documentation

## 2013-04-15 LAB — URINALYSIS, ROUTINE W REFLEX MICROSCOPIC
Bilirubin Urine: NEGATIVE
Hgb urine dipstick: NEGATIVE
Ketones, ur: NEGATIVE mg/dL
Leukocytes, UA: NEGATIVE
Nitrite: POSITIVE — AB
Specific Gravity, Urine: >1.03 — ABNORMAL HIGH (ref 1.005–1.030)
Urobilinogen, UA: 0.2 mg/dL (ref 0.0–1.0)
pH: 6 (ref 5.0–8.0)

## 2013-04-15 LAB — WET PREP, GENITAL: Yeast Wet Prep HPF POC: NONE SEEN

## 2013-04-15 LAB — URINE MICROSCOPIC-ADD ON

## 2013-04-15 MED ORDER — CIPROFLOXACIN HCL 500 MG PO TABS: 500.0000 mg | ORAL_TABLET | Freq: Two times a day (BID) | ORAL | Status: DC

## 2013-04-15 MED ORDER — METRONIDAZOLE 500 MG PO TABS: 500.0000 mg | ORAL_TABLET | Freq: Two times a day (BID) | ORAL | Status: DC

## 2013-04-15 MED ORDER — FLUCONAZOLE 150 MG PO TABS: 150.0000 mg | ORAL_TABLET | Freq: Once | ORAL | Status: DC

## 2013-04-15 NOTE — MAU Provider Note (Signed)
History     CSN: [id number]  Arrival date and time: 04/15/13 1308   First Provider Initiated Contact with Patient 04/15/13 1348      Chief Complaint  Patient presents with  . Vaginal Discharge   Vaginal Discharge The patient's primary symptoms include a vaginal discharge.    Amy Mclean is a 22 y.o. [id number] who presents today with vaginal discharge with odor. She states that she has had it for month, but it would come and go. She denies any dysuria or frequency.   Past Medical History  Diagnosis Date  . No pertinent past medical history     Past Surgical History  Procedure Laterality Date  . No past surgeries      Family History  Problem Relation Age of Onset  . Alcohol abuse Neg Hx   . Arthritis Neg Hx   . Birth defects Neg Hx   . Asthma Neg Hx   . Cancer Neg Hx   . COPD Neg Hx   . Depression Neg Hx   . Diabetes Neg Hx   . Drug abuse Neg Hx   . Early death Neg Hx   . Hearing loss Neg Hx   . Heart disease Neg Hx   . Hyperlipidemia Neg Hx   . Hypertension Neg Hx   . Kidney disease Neg Hx   . Learning disabilities Neg Hx   . Mental illness Neg Hx   . Mental retardation Neg Hx   . Miscarriages / Stillbirths Neg Hx   . Vision loss Neg Hx   . Stroke Neg Hx     History  Substance Use Topics  . Smoking status: Never Smoker   . Smokeless tobacco: Not on file  . Alcohol Use: No    Allergies: No Known Allergies  Prescriptions prior to admission  Medication Sig Dispense Refill  . diphenhydrAMINE (BENADRYL) 25 mg capsule Take 50 mg by mouth every 6 (six) hours as needed for itching. For itching      . predniSONE (DELTASONE) 10 MG tablet Take 4 tablets (40 mg total) by mouth daily.  12 tablet  0    Review of Systems  Genitourinary: Positive for vaginal discharge.   Physical Exam   Blood pressure 118/76, temperature 98.6 F (37 C), temperature source Oral, resp. rate 18, height 5\' 7"  (1.702 m), weight 57.607 kg (127 lb).  Physical Exam  Nursing  note and vitals reviewed. Constitutional: She is oriented to person, place, and time. She appears well-developed and well-nourished. No distress.  Cardiovascular: Normal rate.   Respiratory: Effort normal.  GI: Soft.  Genitourinary:   External: no lesion Vagina: small amount of white discharge Cervix: pink, smooth, no CMT Uterus: NSSC Adnexa: NT   Neurological: She is alert and oriented to person, place, and time.  Skin: Skin is warm and dry.  Psychiatric: She has a normal mood and affect.    MAU Course  Procedures  Results for orders placed during the hospital encounter of 04/15/13 (from the past 24 hour(s))  URINALYSIS, ROUTINE W REFLEX MICROSCOPIC     Status: Abnormal   Collection Time    04/15/13  1:13 PM      Result Value Range   Color, Urine YELLOW  YELLOW   APPearance CLEAR  CLEAR   Specific Gravity, Urine >1.030 (*) 1.005 - 1.030   pH 6.0  5.0 - 8.0   Glucose, UA NEGATIVE  NEGATIVE mg/dL   Hgb urine dipstick NEGATIVE  NEGATIVE   Bilirubin  Urine NEGATIVE  NEGATIVE   Ketones, ur NEGATIVE  NEGATIVE mg/dL   Protein, ur NEGATIVE  NEGATIVE mg/dL   Urobilinogen, UA 0.2  0.0 - 1.0 mg/dL   Nitrite POSITIVE (*) NEGATIVE   Leukocytes, UA NEGATIVE  NEGATIVE  URINE MICROSCOPIC-ADD ON     Status: Abnormal   Collection Time    04/15/13  1:13 PM      Result Value Range   Squamous Epithelial / LPF RARE  RARE   WBC, UA 3-6  <3 WBC/hpf   Bacteria, UA MANY (*) RARE   Urine-Other MUCOUS PRESENT    WET PREP, GENITAL     Status: Abnormal   Collection Time    04/15/13  1:53 PM      Result Value Range   Yeast Wet Prep HPF POC NONE SEEN  NONE SEEN   Trich, Wet Prep NONE SEEN  NONE SEEN   Clue Cells Wet Prep HPF POC MODERATE (*) NONE SEEN   WBC, Wet Prep HPF POC FEW (*) NONE SEEN     Assessment and Plan   1. UTI (lower urinary tract infection)   2. BV (bacterial vaginosis)      Medication List         ciprofloxacin 500 MG tablet  Commonly known as:  CIPRO  Take 1  tablet (500 mg total) by mouth 2 (two) times daily.     fluconazole 150 MG tablet  Commonly known as:  DIFLUCAN  Take 1 tablet (150 mg total) by mouth once. May repeat in 2 days if still having symptoms.     metroNIDAZOLE 500 MG tablet  Commonly known as:  FLAGYL  Take 1 tablet (500 mg total) by mouth 2 (two) times daily.         Tawnya Crook 04/15/2013, 1:55 PM

## 2013-04-15 NOTE — MAU Note (Signed)
Pt presents with c/o vaginal discharge for several months.

## 2013-04-16 LAB — GC/CHLAMYDIA PROBE AMP: GC Probe RNA: NEGATIVE

## 2013-04-17 LAB — URINE CULTURE

## 2013-04-17 NOTE — MAU Provider Note (Signed)
Attestation of Attending Supervision of Advanced Practitioner (CNM/NP): Evaluation and management procedures were performed by the Advanced Practitioner under my supervision and collaboration.  I have reviewed the Advanced Practitioner's note and chart, and I agree with the management and plan.  Nuriyah Hanline 04/17/2013 9:22 AM   

## 2013-05-20 ENCOUNTER — Other Ambulatory Visit (HOSPITAL_COMMUNITY): Payer: Self-pay | Admitting: Advanced Practice Midwife

## 2013-08-02 ENCOUNTER — Emergency Department (HOSPITAL_COMMUNITY)
Admission: EM | Admit: 2013-08-02 | Discharge: 2013-08-02 | Disposition: A | Discharge status: Home / Self Care | Payer: BC Managed Care – PPO | Attending: Emergency Medicine | Admitting: Emergency Medicine

## 2013-08-02 ENCOUNTER — Encounter (HOSPITAL_COMMUNITY): Payer: Self-pay | Admitting: Emergency Medicine

## 2013-08-02 DIAGNOSIS — R209 Unspecified disturbances of skin sensation: Secondary | ICD-10-CM | POA: Insufficient documentation

## 2013-08-02 DIAGNOSIS — Z792 Long term (current) use of antibiotics: Secondary | ICD-10-CM | POA: Insufficient documentation

## 2013-08-02 DIAGNOSIS — M79641 Pain in right hand: Secondary | ICD-10-CM

## 2013-08-02 DIAGNOSIS — M79609 Pain in unspecified limb: Secondary | ICD-10-CM | POA: Insufficient documentation

## 2013-08-02 NOTE — ED Notes (Signed)
Pt arrived to the ED with a complaint of right hand pain located in the palm of her hand.  Pt states pain which she describes as beginning as a numbness started three days ago and has progressed to a pain when she moves of uses her hand.  Pt states she has taken ibuprofen which has aided in the pain relief.

## 2013-08-02 NOTE — ED Provider Notes (Signed)
CSN: [id number]     Arrival date & time 08/02/13  1935 History  This chart was scribed for non-physician practitioner, Earley Favor, FNP working with Merrie Roof, MD by Greggory Stallion, ED scribe. This patient was seen in room WTR7/WTR7 and the patient's care was started at 8:46 PM.   Chief Complaint  Patient presents with  . Hand Pain   The history is provided by the patient. No language interpreter was used.   HPI Comments: Amy Mclean is a 23 y.o. female who presents to the Emergency Department complaining of gradual onset, constant right hand pain that started 3 days ago. She states it started as numbness to her palm then developed into pain. Pt has taken ibuprofen with relief. She denies pain currently. But came in for evaluation because she was afraid it would return   Past Medical History  Diagnosis Date  . No pertinent past medical history    Past Surgical History  Procedure Laterality Date  . No past surgeries     Family History  Problem Relation Age of Onset  . Alcohol abuse Neg Hx   . Arthritis Neg Hx   . Birth defects Neg Hx   . Asthma Neg Hx   . Cancer Neg Hx   . COPD Neg Hx   . Depression Neg Hx   . Diabetes Neg Hx   . Drug abuse Neg Hx   . Early death Neg Hx   . Hearing loss Neg Hx   . Heart disease Neg Hx   . Hyperlipidemia Neg Hx   . Hypertension Neg Hx   . Kidney disease Neg Hx   . Learning disabilities Neg Hx   . Mental illness Neg Hx   . Mental retardation Neg Hx   . Miscarriages / Stillbirths Neg Hx   . Vision loss Neg Hx   . Stroke Neg Hx    History  Substance Use Topics  . Smoking status: Never Smoker   . Smokeless tobacco: Not on file  . Alcohol Use: No   OB History   Grav Para Term Preterm Abortions TAB SAB Ect Mult Living   2 2 2  0 0 0 0 0 0 2     Review of Systems  Constitutional: Negative for fever.  Musculoskeletal: Negative for arthralgias.  Skin: Negative for wound.  Neurological: Positive for numbness.  All  other systems reviewed and are negative.   Allergies  Review of patient's allergies indicates no known allergies.  Home Medications   Current Outpatient Rx  Name  Route  Sig  Dispense  Refill  . ciprofloxacin (CIPRO) 500 MG tablet   Oral   Take 1 tablet (500 mg total) by mouth 2 (two) times daily.   10 tablet   0   . fluconazole (DIFLUCAN) 150 MG tablet   Oral   Take 1 tablet (150 mg total) by mouth once. May repeat in 2 days if still having symptoms.   2 tablet   0   . metroNIDAZOLE (FLAGYL) 500 MG tablet      TAKE 1 TABLET (500 MG TOTAL) BY MOUTH 2 (TWO) TIMES DAILY.   14 tablet   0    BP 118/76  Pulse 92  Temp(Src) 98 F (36.7 C) (Oral)  Resp 18  Ht 5\' 7"  (1.702 m)  Wt 125 lb (56.7 kg)  BMI 19.57 kg/m2  SpO2 100%  LMP 07/17/2013  Physical Exam  Nursing note and vitals reviewed. Constitutional: She is oriented  to person, place, and time. She appears well-developed and well-nourished. No distress.  HENT:  Head: Normocephalic and atraumatic.  Eyes: EOM are normal.  Neck: Normal range of motion. Neck supple.  Cardiovascular: Normal rate.   Pulmonary/Chest: Effort normal. No respiratory distress.  Musculoskeletal: Normal range of motion. She exhibits no edema and no tenderness.  Neurological: She is alert and oriented to person, place, and time.  Skin: Skin is warm and dry. No erythema.  Psychiatric: She has a normal mood and affect. Her behavior is normal.    ED Course  Procedures (including critical care time)  DIAGNOSTIC STUDIES: Oxygen Saturation is 100% on RA, normal by my interpretation.    COORDINATION OF CARE: 8:47 PM-Discussed treatment plan which includes ibuprofen if pain returns with pt at bedside and pt agreed to plan.   Labs Review Labs Reviewed - No data to display Imaging Review No results found.  EKG Interpretation   None     unknown cause of there pain denies trauma  But took one dose of Ibuprofen this morning and has not had  return of discomfort   MDM   Final diagnoses:  None       I personally performed the services described in this documentation, which was scribed in my presence. The recorded information has been reviewed and is accurate.  Arman FilterGail K Ronnetta Currington, NP 08/02/13 [phone]

## 2013-08-06 NOTE — ED Provider Notes (Signed)
Medical screening examination/treatment/procedure(s) were performed by non-physician practitioner and as supervising physician I was immediately available for consultation/collaboration.  EKG Interpretation   None         Candyce ChurnJohn David Monet North III, MD 08/06/13 1218

## 2013-09-22 ENCOUNTER — Inpatient Hospital Stay (HOSPITAL_COMMUNITY)
Admission: AD | Admit: 2013-09-22 | Discharge: 2013-09-22 | Disposition: A | Discharge status: Home / Self Care | Payer: BC Managed Care – PPO | Source: Ambulatory Visit | Attending: Family Medicine | Admitting: Family Medicine

## 2013-09-22 ENCOUNTER — Encounter (HOSPITAL_COMMUNITY): Payer: Self-pay | Admitting: *Deleted

## 2013-09-22 DIAGNOSIS — N898 Other specified noninflammatory disorders of vagina: Secondary | ICD-10-CM

## 2013-09-22 DIAGNOSIS — D649 Anemia, unspecified: Secondary | ICD-10-CM | POA: Insufficient documentation

## 2013-09-22 DIAGNOSIS — R109 Unspecified abdominal pain: Secondary | ICD-10-CM | POA: Insufficient documentation

## 2013-09-22 DIAGNOSIS — N939 Abnormal uterine and vaginal bleeding, unspecified: Secondary | ICD-10-CM

## 2013-09-22 DIAGNOSIS — N938 Other specified abnormal uterine and vaginal bleeding: Secondary | ICD-10-CM | POA: Insufficient documentation

## 2013-09-22 DIAGNOSIS — N949 Unspecified condition associated with female genital organs and menstrual cycle: Secondary | ICD-10-CM | POA: Insufficient documentation

## 2013-09-22 HISTORY — DX: Urinary tract infection, site not specified: N39.0

## 2013-09-22 HISTORY — DX: Chlamydial infection, unspecified: A74.9

## 2013-09-22 LAB — URINALYSIS, ROUTINE W REFLEX MICROSCOPIC
BILIRUBIN URINE: NEGATIVE
Glucose, UA: NEGATIVE mg/dL
Ketones, ur: NEGATIVE mg/dL
Leukocytes, UA: NEGATIVE
Nitrite: NEGATIVE
PROTEIN: NEGATIVE mg/dL
Specific Gravity, Urine: 1.025 (ref 1.005–1.030)
Urobilinogen, UA: 0.2 mg/dL (ref 0.0–1.0)
pH: 6 (ref 5.0–8.0)

## 2013-09-22 LAB — URINE MICROSCOPIC-ADD ON

## 2013-09-22 LAB — WET PREP, GENITAL
TRICH WET PREP: NONE SEEN
YEAST WET PREP: NONE SEEN

## 2013-09-22 LAB — CBC
HEMATOCRIT: 33.9 % — AB (ref 36.0–46.0)
Hemoglobin: 10.8 g/dL — ABNORMAL LOW (ref 12.0–15.0)
MCH: 27.3 pg (ref 26.0–34.0)
MCHC: 31.9 g/dL (ref 30.0–36.0)
MCV: 85.6 fL (ref 78.0–100.0)
Platelets: 302 10*3/uL (ref 150–400)
RBC: 3.96 MIL/uL (ref 3.87–5.11)
RDW: 14.1 % (ref 11.5–15.5)
WBC: 6.6 10*3/uL (ref 4.0–10.5)

## 2013-09-22 LAB — POCT PREGNANCY, URINE: Preg Test, Ur: NEGATIVE

## 2013-09-22 NOTE — MAU Provider Note (Signed)
History     CSN: [id number]  Arrival date and time: 09/22/13 1121   First Provider Initiated Contact with Patient 09/22/13 1247      Chief Complaint  Patient presents with  . Vaginal Bleeding  . Abdominal Pain   HPI  [name] is a 23 y.o. female [phone] who presents to MAU with irregular vaginal bleeding. Her LMP was March 26; normal cycle for her. She started bleeding again 2 days ago and has continued to bleed. She has no history of irregular cycles; "this has never happened before". The bleeding is described as "light at first, heavier this morning". She is also experiencing mild abdominal cramping that she rates 1-2/10.  Patient is sexually active; currently using nothing for birth control.   Pt took plan B last Wednesday.   OB History   Grav Para Term Preterm Abortions TAB SAB Ect Mult Living   2 2 2  0 0 0 0 0 0 2      Past Medical History  Diagnosis Date  . No pertinent past medical history   . Urinary tract infection   . Chlamydia     Past Surgical History  Procedure Laterality Date  . No past surgeries      Family History  Problem Relation Age of Onset  . Alcohol abuse Neg Hx   . Arthritis Neg Hx   . Birth defects Neg Hx   . Asthma Neg Hx   . Cancer Neg Hx   . COPD Neg Hx   . Depression Neg Hx   . Diabetes Neg Hx   . Drug abuse Neg Hx   . Early death Neg Hx   . Hearing loss Neg Hx   . Heart disease Neg Hx   . Hyperlipidemia Neg Hx   . Hypertension Neg Hx   . Kidney disease Neg Hx   . Learning disabilities Neg Hx   . Mental illness Neg Hx   . Mental retardation Neg Hx   . Miscarriages / Stillbirths Neg Hx   . Vision loss Neg Hx   . Stroke Neg Hx     History  Substance Use Topics  . Smoking status: Never Smoker   . Smokeless tobacco: Never Used  . Alcohol Use: No    Allergies: No Known Allergies  No prescriptions prior to admission   Results for orders placed during the hospital encounter of 09/22/13 (from the past 48  hour(s))  POCT PREGNANCY, URINE     Status: None   Collection Time    09/22/13 12:21 PM      Result Value Ref Range   Preg Test, Ur NEGATIVE  NEGATIVE   Comment:            THE SENSITIVITY OF THIS     METHODOLOGY IS >24 mIU/mL    Results for orders placed during the hospital encounter of 09/22/13 (from the past 48 hour(s))  URINALYSIS, ROUTINE W REFLEX MICROSCOPIC     Status: Abnormal   Collection Time    09/22/13 11:50 AM      Result Value Ref Range   Color, Urine YELLOW  YELLOW   APPearance CLEAR  CLEAR   Specific Gravity, Urine 1.025  1.005 - 1.030   pH 6.0  5.0 - 8.0   Glucose, UA NEGATIVE  NEGATIVE mg/dL   Hgb urine dipstick SMALL (*) NEGATIVE   Bilirubin Urine NEGATIVE  NEGATIVE   Ketones, ur NEGATIVE  NEGATIVE mg/dL   Protein, ur NEGATIVE  NEGATIVE mg/dL   Urobilinogen, UA 0.2  0.0 - 1.0 mg/dL   Nitrite NEGATIVE  NEGATIVE   Leukocytes, UA NEGATIVE  NEGATIVE  URINE MICROSCOPIC-ADD ON     Status: Abnormal   Collection Time    09/22/13 11:50 AM      Result Value Ref Range   Squamous Epithelial / LPF FEW (*) RARE   WBC, UA 3-6  <3 WBC/hpf   RBC / HPF 0-2  <3 RBC/hpf   Urine-Other MUCOUS PRESENT    POCT PREGNANCY, URINE     Status: None   Collection Time    09/22/13 12:21 PM      Result Value Ref Range   Preg Test, Ur NEGATIVE  NEGATIVE   Comment:            THE SENSITIVITY OF THIS     METHODOLOGY IS >24 mIU/mL  CBC     Status: Abnormal   Collection Time    09/22/13 12:40 PM      Result Value Ref Range   WBC 6.6  4.0 - 10.5 K/uL   RBC 3.96  3.87 - 5.11 MIL/uL   Hemoglobin 10.8 (*) 12.0 - 15.0 g/dL   HCT 82.933.9 (*) 56.236.0 - 13.046.0 %   MCV 85.6  78.0 - 100.0 fL   MCH 27.3  26.0 - 34.0 pg   MCHC 31.9  30.0 - 36.0 g/dL   RDW 86.514.1  78.411.5 - 69.615.5 %   Platelets 302  150 - 400 K/uL  WET PREP, GENITAL     Status: Abnormal   Collection Time    09/22/13  1:55 PM      Result Value Ref Range   Yeast Wet Prep HPF POC NONE SEEN  NONE SEEN   Trich, Wet Prep NONE SEEN  NONE  SEEN   Clue Cells Wet Prep HPF POC FEW (*) NONE SEEN   WBC, Wet Prep HPF POC FEW (*) NONE SEEN   Comment: MODERATE BACTERIA SEEN    Review of Systems  Constitutional: Negative for fever and chills.  Gastrointestinal: Positive for abdominal pain (Mid-lower abdominal pain ). Negative for nausea and vomiting.  Genitourinary: Negative for dysuria, urgency, frequency and hematuria.       No vaginal discharge. + vaginal bleeding. No dysuria.    Physical Exam   Blood pressure 115/71, pulse 91, temperature 98.2 F (36.8 C), temperature source Oral, resp. rate 16, height 5\' 7"  (1.702 m), weight 60.419 kg (133 lb 3.2 oz), last menstrual period 09/10/2013, SpO2 100.00%.  Physical Exam  Constitutional: She is oriented to person, place, and time. She appears well-developed and well-nourished. No distress.  HENT:  Head: Normocephalic.  Eyes: Pupils are equal, round, and reactive to light.  Neck: Normal range of motion.  Respiratory: Effort normal.  GI: Soft. She exhibits no distension. There is no tenderness. There is no rebound and no guarding.  Genitourinary:  Speculum exam: Vagina - Small amount of dark red blood in vaginal canal, no odor Cervix - scant active bleeding  Bimanual exam: Cervix closed, No CMT  Uterus non tender, normal size Adnexa non tender, no masses bilaterally GC/Chlam, wet prep done Chaperone present for exam.   Musculoskeletal: Normal range of motion.  Neurological: She is alert and oriented to person, place, and time.  Skin: Skin is warm. She is not diaphoretic.  Psychiatric: Her behavior is normal.    MAU Course  Procedures None  MDM CBC Wet prep GC/Chlamydia  Bleeding likely related to the patient  taking plan B 1 week ago. Pt is to take a pregnancy test at the end of the month or around the time her normal cycle should start.  Assessment and Plan   A:  Abnormal vaginal bleeding Mild anemia   P:  Discharge home in stable condition.  Return to  MAU as needed, if symptoms worsen Bleeding precautions discussed  Iona Hansen Rasch, NP  09/22/2013, 3:06 PM

## 2013-09-22 NOTE — Progress Notes (Signed)
To lobby to await results.

## 2013-09-22 NOTE — Discharge Instructions (Signed)
Abnormal Uterine Bleeding Abnormal uterine bleeding means bleeding from the vagina that is not your normal menstrual period. This can be:  Bleeding or spotting between periods.  Bleeding after sex (sexual intercourse).  Bleeding that is heavier or more than normal.  Periods that last longer than usual.  Bleeding after menopause. There are many problems that may cause this. Treatment will depend on the cause of the bleeding. Any kind of bleeding that is not normal should be reviewed by your doctor.  HOME CARE Watch your condition for any changes. These actions may lessen any discomfort you are having:  Do not use tampons or douches as told by your doctor.  Change your pads often. You should get regular pelvic exams and Pap tests. Keep all appointments for tests as told by your doctor. GET HELP IF:  You are bleeding for more than 1 week.  You feel dizzy at times. GET HELP RIGHT AWAY IF:   You pass out.  You have to change pads every 15 to 30 minutes.  You have belly pain.  You have a fever.  You become sweaty or weak.  You are passing large blood clots from the vagina.  You feel sick to your stomach (nauseous) and throw up (vomit). MAKE SURE YOU:  Understand these instructions.  Will watch your condition.  Will get help right away if you are not doing well or get worse. Document Released: 04/01/2009 Document Revised: 03/25/2013 Document Reviewed: 01/01/2013 [employer] Patient Information 2014 [employer].  

## 2013-09-22 NOTE — MAU Note (Signed)
Spotting started on Sunday, bleeding much heavier this morning.  LMP was 3/26.  Pt took Plan B pill 3/31.  Mild lower abd cramping today.

## 2013-09-23 LAB — GC/CHLAMYDIA PROBE AMP
CT Probe RNA: POSITIVE — AB
GC PROBE AMP APTIMA: NEGATIVE

## 2013-09-23 NOTE — MAU Provider Note (Signed)
Attestation of Attending Supervision of Advanced Practitioner (PA/CNM/NP): Evaluation and management procedures were performed by the Advanced Practitioner under my supervision and collaboration.  I have reviewed the Advanced Practitioner's note and chart, and I agree with the management and plan.  Shayla Heming, DO Attending Physician Faculty Practice, [facility]  

## 2013-09-24 ENCOUNTER — Telehealth: Payer: Self-pay | Admitting: General Practice

## 2013-09-24 DIAGNOSIS — A749 Chlamydial infection, unspecified: Secondary | ICD-10-CM

## 2013-09-24 MED ORDER — AZITHROMYCIN 250 MG PO TABS: 1000.0000 mg | ORAL_TABLET | Freq: Once | ORAL | Status: DC

## 2013-09-24 NOTE — Telephone Encounter (Signed)
Message copied by Kathee DeltonHILLMAN, CARRIE L on Thu Sep 24, 2013 11:42 AM ------      Message from: Pennie BanterSMITH, MARNI W      Created: Thu Sep 24, 2013  9:12 AM       Telephone call to patient regarding positive chlamydia culture, patient notified.  Patient has not been treated and will need [facility] [address]   Instructed patient to notify her partner for treatment.  Report faxed to health department. ------

## 2013-09-24 NOTE — Telephone Encounter (Signed)
Med ordered, transmission to pharmacy failed. [facility] Called patient, no answer- left message stating we are calling to let her know the medication she needed has been sent to her pharmacy and she may go by and pick it up, if she has any questions or concerns she can call us back at the clinics

## 2013-12-28 ENCOUNTER — Inpatient Hospital Stay (HOSPITAL_COMMUNITY)
Admission: AD | Admit: 2013-12-28 | Discharge: 2013-12-28 | Disposition: A | Discharge status: Home / Self Care | Payer: BC Managed Care – PPO | Source: Ambulatory Visit | Attending: Family Medicine | Admitting: Family Medicine

## 2013-12-28 ENCOUNTER — Inpatient Hospital Stay (HOSPITAL_COMMUNITY): Payer: BC Managed Care – PPO

## 2013-12-28 ENCOUNTER — Encounter (HOSPITAL_COMMUNITY): Payer: Self-pay

## 2013-12-28 DIAGNOSIS — N898 Other specified noninflammatory disorders of vagina: Secondary | ICD-10-CM | POA: Insufficient documentation

## 2013-12-28 DIAGNOSIS — O209 Hemorrhage in early pregnancy, unspecified: Secondary | ICD-10-CM | POA: Insufficient documentation

## 2013-12-28 DIAGNOSIS — O219 Vomiting of pregnancy, unspecified: Secondary | ICD-10-CM

## 2013-12-28 DIAGNOSIS — O210 Mild hyperemesis gravidarum: Secondary | ICD-10-CM | POA: Insufficient documentation

## 2013-12-28 DIAGNOSIS — O4690 Antepartum hemorrhage, unspecified, unspecified trimester: Secondary | ICD-10-CM

## 2013-12-28 LAB — URINALYSIS, ROUTINE W REFLEX MICROSCOPIC
BILIRUBIN URINE: NEGATIVE
Glucose, UA: NEGATIVE mg/dL
HGB URINE DIPSTICK: NEGATIVE
KETONES UR: NEGATIVE mg/dL
Leukocytes, UA: NEGATIVE
NITRITE: NEGATIVE
PH: 6.5 (ref 5.0–8.0)
Protein, ur: NEGATIVE mg/dL
Specific Gravity, Urine: 1.02 (ref 1.005–1.030)
Urobilinogen, UA: 1 mg/dL (ref 0.0–1.0)

## 2013-12-28 LAB — WET PREP, GENITAL: TRICH WET PREP: NONE SEEN

## 2013-12-28 LAB — CBC
HCT: 33.3 % — ABNORMAL LOW (ref 36.0–46.0)
Hemoglobin: 10.9 g/dL — ABNORMAL LOW (ref 12.0–15.0)
MCH: 27.5 pg (ref 26.0–34.0)
MCHC: 32.7 g/dL (ref 30.0–36.0)
MCV: 84.1 fL (ref 78.0–100.0)
PLATELETS: 308 10*3/uL (ref 150–400)
RBC: 3.96 MIL/uL (ref 3.87–5.11)
RDW: 16.3 % — ABNORMAL HIGH (ref 11.5–15.5)
WBC: 8.4 10*3/uL (ref 4.0–10.5)

## 2013-12-28 LAB — POCT PREGNANCY, URINE: Preg Test, Ur: POSITIVE — AB

## 2013-12-28 LAB — HCG, QUANTITATIVE, PREGNANCY: hCG, Beta Chain, Quant, S: 42939 m[IU]/mL — ABNORMAL HIGH (ref <5)

## 2013-12-28 MED ORDER — PROMETHAZINE HCL 25 MG PO TABS
25.0000 mg | ORAL_TABLET | Freq: Once | ORAL | Status: AC
  Administered 2013-12-28: 25 mg via ORAL
  Filled 2013-12-28: qty 1

## 2013-12-28 MED ORDER — PROMETHAZINE HCL 25 MG PO TABS: 25.0000 mg | ORAL_TABLET | Freq: Four times a day (QID) | ORAL | Status: DC | PRN

## 2013-12-28 MED ORDER — PROMETHAZINE HCL 25 MG/ML IJ SOLN
25.0000 mg | Freq: Once | INTRAMUSCULAR | Status: AC
  Administered 2013-12-28: 25 mg via INTRAMUSCULAR
  Filled 2013-12-28: qty 1

## 2013-12-28 NOTE — MAU Provider Note (Signed)
History     CSN: [id number]  Arrival date and time: 12/28/13 1916   None     Chief Complaint  Patient presents with  . Possible Pregnancy  . Vaginal Discharge  . Morning Sickness  . Abdominal Pain   HPI Comments: Amy Mclean 23 y.o. [id number] [redacted]w[redacted]d presents to MAU with nausea and brown vaginal discharge both ongoing for 2 weeks. She has not had any medications to take and has not established prenatal care. She had chlamydia in April.   Possible Pregnancy Associated symptoms include nausea and vomiting.  Vaginal Discharge Associated symptoms include nausea and vomiting.  Abdominal Pain Associated symptoms include nausea and vomiting.      Past Medical History  Diagnosis Date  . No pertinent past medical history   . Urinary tract infection   . Chlamydia     Past Surgical History  Procedure Laterality Date  . No past surgeries      Family History  Problem Relation Age of Onset  . Alcohol abuse Neg Hx   . Arthritis Neg Hx   . Birth defects Neg Hx   . Asthma Neg Hx   . Cancer Neg Hx   . COPD Neg Hx   . Depression Neg Hx   . Diabetes Neg Hx   . Drug abuse Neg Hx   . Early death Neg Hx   . Hearing loss Neg Hx   . Heart disease Neg Hx   . Hyperlipidemia Neg Hx   . Hypertension Neg Hx   . Kidney disease Neg Hx   . Learning disabilities Neg Hx   . Mental illness Neg Hx   . Mental retardation Neg Hx   . Miscarriages / Stillbirths Neg Hx   . Vision loss Neg Hx   . Stroke Neg Hx     History  Substance Use Topics  . Smoking status: Never Smoker   . Smokeless tobacco: Never Used  . Alcohol Use: No    Allergies: No Known Allergies  No prescriptions prior to admission    Review of Systems  Constitutional: Negative.   HENT: Negative.   Eyes: Negative.   Cardiovascular: Negative.   Gastrointestinal: Positive for nausea and vomiting.  Genitourinary: Negative.        Vaginal discharge brown   Musculoskeletal: Negative.   Skin: Negative.    Neurological: Negative.   Psychiatric/Behavioral: Negative.    Physical Exam   Blood pressure 106/68, pulse 89, temperature 98.4 F (36.9 C), temperature source Oral, resp. rate 16, height 5\' 7"  (1.702 m), weight 59.875 kg (132 lb), last menstrual period 11/12/2013, SpO2 100.00%.  Physical Exam  Constitutional: She is oriented to person, place, and time. She appears well-developed and well-nourished. No distress.  HENT:  Head: Normocephalic and atraumatic.  Eyes: Pupils are equal, round, and reactive to light.  Cardiovascular: Normal rate, regular rhythm and normal heart sounds.   Respiratory: Effort normal and breath sounds normal.  GI: Soft. Bowel sounds are normal. She exhibits no distension. There is no tenderness. There is no rebound and no guarding.  Genitourinary:  Genital:external negative Vaginal:thick pink odorous discharge Cervix:closed/ thick Bimanual:nontender   Musculoskeletal: Normal range of motion.  Neurological: She is alert and oriented to person, place, and time.  Skin: Skin is warm and dry.  Psychiatric: She has a normal mood and affect. Her behavior is normal. Judgment and thought content normal.   Results for orders placed during the hospital encounter of 12/28/13 (from the past 24 hour(s))  URINALYSIS, ROUTINE W REFLEX MICROSCOPIC     Status: None   Collection Time    12/28/13  8:32 PM      Result Value Ref Range   Color, Urine YELLOW  YELLOW   APPearance CLEAR  CLEAR   Specific Gravity, Urine 1.020  1.005 - 1.030   pH 6.5  5.0 - 8.0   Glucose, UA NEGATIVE  NEGATIVE mg/dL   Hgb urine dipstick NEGATIVE  NEGATIVE   Bilirubin Urine NEGATIVE  NEGATIVE   Ketones, ur NEGATIVE  NEGATIVE mg/dL   Protein, ur NEGATIVE  NEGATIVE mg/dL   Urobilinogen, UA 1.0  0.0 - 1.0 mg/dL   Nitrite NEGATIVE  NEGATIVE   Leukocytes, UA NEGATIVE  NEGATIVE  POCT PREGNANCY, URINE     Status: Abnormal   Collection Time    12/28/13  8:39 PM      Result Value Ref Range   Preg  Test, Ur POSITIVE (*) NEGATIVE  HCG, QUANTITATIVE, PREGNANCY     Status: Abnormal   Collection Time    12/28/13  8:45 PM      Result Value Ref Range   hCG, Beta Nyra JabsChain, Quant, [place] [id number] (*) <5 mIU/mL  CBC     Status: Abnormal   Collection Time    12/28/13  9:23 PM      Result Value Ref Range   WBC 8.4  4.0 - 10.5 K/uL   RBC 3.96  3.87 - 5.11 MIL/uL   Hemoglobin 10.9 (*) 12.0 - 15.0 g/dL   HCT 60.433.3 (*) 54.036.0 - 98.146.0 %   MCV 84.1  78.0 - 100.0 fL   MCH 27.5  26.0 - 34.0 pg   MCHC 32.7  30.0 - 36.0 g/dL   RDW 19.116.3 (*) 47.811.5 - 29.515.5 %   Platelets 308  150 - 400 K/uL  ABO/RH     Status: None   Collection Time    12/28/13  9:23 PM      Result Value Ref Range   ABO/RH(D) O POS    WET PREP, GENITAL     Status: Abnormal   Collection Time    12/28/13  9:30 PM      Result Value Ref Range   Yeast Wet Prep HPF POC FEW (*) NONE SEEN   Trich, Wet Prep NONE SEEN  NONE SEEN   Clue Cells Wet Prep HPF POC FEW (*) NONE SEEN   WBC, Wet Prep HPF POC FEW (*) NONE SEEN   [place] Ob Comp Less 14 Wks  12/28/2013   CLINICAL DATA:  Pelvic pain and cramping  EXAM: OBSTETRIC <14 WK US AND TRANSVAGINAL OB US  TECHNIQUE: Both transabdominal and transvaginal ultrasound examinations were performed for complete evaluation of the gestation as well as the maternal uterus, adnexal regions, and pelvic cul-de-sac. Transvaginal technique was performed to assess early pregnancy.  COMPARISON:  None.  FINDINGS: Intrauterine gestational sac: Visualized/normal in shape.  Yolk sac:  Visualized  Embryo:  Visualized  Cardiac Activity: Visualized  Heart Rate:  122 bpm  CRL:   8  mm   6 w 6 d                  US EDC: August 17, 2014  Maternal uterus/adnexae: There is a subchorionic hemorrhage measuring 1.6 by 0.8 cm. Maternal uterus otherwise appears normal. Cervical os is closed. Maternal ovaries appear normal bilaterally. There is no free pelvic fluid.  IMPRESSION: Single live intrauterine gestation with estimated gestational age of 247- weeks.  Small  subchorionic hemorrhage. Study otherwise unremarkable.   Electronically Signed   By: Bretta Bang M.D.   On: 12/28/2013 22:48   US Ob Transvaginal  12/28/2013   CLINICAL DATA:  Pelvic pain and cramping  EXAM: OBSTETRIC <14 WK [place] AND TRANSVAGINAL OB US  TECHNIQUE: Both transabdominal and transvaginal ultrasound examinations were performed for complete evaluation of the gestation as well as the maternal uterus, adnexal regions, and pelvic cul-de-sac. Transvaginal technique was performed to assess early pregnancy.  COMPARISON:  None.  FINDINGS: Intrauterine gestational sac: Visualized/normal in shape.  Yolk sac:  Visualized  Embryo:  Visualized  Cardiac Activity: Visualized  Heart Rate:  122 bpm  CRL:   8  mm   6 w 6 d                  [place] EDC: August 17, 2014  Maternal uterus/adnexae: There is a subchorionic hemorrhage measuring 1.6 by 0.8 cm. Maternal uterus otherwise appears normal. Cervical os is closed. Maternal ovaries appear normal bilaterally. There is no free pelvic fluid.  IMPRESSION: Single live intrauterine gestation with estimated gestational age of 36- weeks. Small subchorionic hemorrhage. Study otherwise unremarkable.   Electronically Signed   By: Bretta Bang M.D.   On: 12/28/2013 22:48     MAU Course  Procedures  MDM Wet prep, GC, Chlamydia, CBC, UA, U/S, ABORh, Quant Phenergan 25 mg for nausea/ vomited pill and will repeat IM  Assessment and Plan  A: Vaginal bleeding in early pregnancy Nausea in pregnancy  P: Phenergan 25 mg po q6 hrs Establish prenatal care asap Start PNV daily Small frequent meals Return to MAU as needed  Carolynn Serve 12/28/2013, 9:38 PM

## 2013-12-28 NOTE — Discharge Instructions (Signed)
Morning Sickness Morning sickness is when you feel sick to your stomach (nauseous) during pregnancy. This nauseous feeling may or may not come with vomiting. It often occurs in the morning but can be a problem any time of day. Morning sickness is most common during the first trimester, but it may continue throughout pregnancy. While morning sickness is unpleasant, it is usually harmless unless you develop severe and continual vomiting (hyperemesis gravidarum). This condition requires more intense treatment.  CAUSES  The cause of morning sickness is not completely known but seems to be related to normal hormonal changes that occur in pregnancy. RISK FACTORS You are at greater risk if you:  Experienced nausea or vomiting before your pregnancy.  Had morning sickness during a previous pregnancy.  Are pregnant with more than one baby, such as twins. TREATMENT  Do not use any medicines (prescription, over-the-counter, or herbal) for morning sickness without first talking to your health care provider. Your health care provider may prescribe or recommend:  Vitamin B6 supplements.  Anti-nausea medicines.  The herbal medicine ginger. HOME CARE INSTRUCTIONS   Only take over-the-counter or prescription medicines as directed by your health care provider.  Taking multivitamins before getting pregnant can prevent or decrease the severity of morning sickness in most women.  Eat a piece of dry toast or unsalted crackers before getting out of bed in the morning.  Eat five or six small meals a day.  Eat dry and bland foods (rice, baked potato). Foods high in carbohydrates are often helpful.  Do not drink liquids with your meals. Drink liquids between meals.  Avoid greasy, fatty, and spicy foods.  Get someone to cook for you if the smell of any food causes nausea and vomiting.  If you feel nauseous after taking prenatal vitamins, take the vitamins at night or with a snack.  Snack on protein  foods (nuts, yogurt, cheese) between meals if you are hungry.  Eat unsweetened gelatins for desserts.  Wearing an acupressure wristband (worn for sea sickness) may be helpful.  Acupuncture may be helpful.  Do not smoke.  Get a humidifier to keep the air in your house free of odors.  Get plenty of fresh air. SEEK MEDICAL CARE IF:   Your home remedies are not working, and you need medicine.  You feel dizzy or lightheaded.  You are losing weight. SEEK IMMEDIATE MEDICAL CARE IF:   You have persistent and uncontrolled nausea and vomiting.  You pass out (faint). MAKE SURE YOU:  Understand these instructions.  Will watch your condition.  Will get help right away if you are not doing well or get worse. Document Released: 07/26/2006 Document Revised: 06/09/2013 Document Reviewed: 11/19/2012 [facility] Patient Information 2015 [place], [place]. This information is not intended to replace advice given to you by your health care provider. Make sure you discuss any questions you have with your health care provider.  Pelvic Rest Pelvic rest is sometimes recommended for women when:   The placenta is partially or completely covering the opening of the cervix (placenta previa).  There is bleeding between the uterine wall and the amniotic sac in the first trimester (subchorionic hemorrhage).  The cervix begins to open without labor starting (incompetent cervix, cervical insufficiency).  The labor is too early (preterm labor). HOME CARE INSTRUCTIONS  Do not have sexual intercourse, stimulation, or an orgasm.  Do not use tampons, douche, or put anything in the vagina.  Do not lift anything over 10 pounds (4.5 kg).  Avoid strenuous activity  or straining your pelvic muscles. SEEK MEDICAL CARE IF:  You have any vaginal bleeding during pregnancy. Treat this as a potential emergency.  You have cramping pain felt low in the stomach (stronger than menstrual cramps).  You notice vaginal  discharge (watery, mucus, or bloody).  You have a low, dull backache.  There are regular contractions or uterine tightening. SEEK IMMEDIATE MEDICAL CARE IF: You have vaginal bleeding and have placenta previa.  Document Released: 09/29/2010 Document Revised: 08/27/2011 Document Reviewed: 09/29/2010 [facility] Patient Information 2015 [place]. This information is not intended to replace advice given to you by your health care provider. Make sure you discuss any questions you have with your health care provider.

## 2013-12-28 NOTE — MAU Note (Signed)
Pt reports she has been nauseated x 2 weeks, lower abd cramping. Brown discharge. States LMP 11/12/2013

## 2013-12-29 ENCOUNTER — Telehealth: Payer: Self-pay

## 2013-12-29 LAB — ABO/RH: ABO/RH(D): O POS

## 2013-12-29 LAB — GC/CHLAMYDIA PROBE AMP
CT Probe RNA: POSITIVE — AB
GC PROBE AMP APTIMA: NEGATIVE

## 2013-12-29 MED ORDER — AZITHROMYCIN 500 MG PO TABS: 1000.0000 mg | ORAL_TABLET | Freq: Once | ORAL | Status: DC

## 2013-12-29 NOTE — Telephone Encounter (Signed)
Called pt and left message that [facility].  I am calling to inform you that [facility] [address]  and if she has any questions to please give us a call.

## 2013-12-29 NOTE — Telephone Encounter (Signed)
Message copied by Faythe CasaBELLAMY, Isao Seltzer M on Tue Dec 29, 2013 10:18 AM ------      Message from: Pennie BanterSMITH, MARNI W      Created: Tue Dec 29, 2013 10:06 AM       Telephone call to patient regarding positive chlamydia culture, patient notified.  Patient was not treated and will need [facility] [address].  Instructed patient to notify her partner for treatment.  Report faxed to health department. ------

## 2013-12-30 NOTE — MAU Provider Note (Signed)
Attestation of Attending Supervision of Advanced Practitioner (PA/CNM/NP): Evaluation and management procedures were performed by the Advanced Practitioner under my supervision and collaboration.  I have reviewed the Advanced Practitioner's note and chart, and I agree with the management and plan.  Reva BoresPRATT,[place] M[place] S, MD [facility] Attending 12/30/2013 10:10 AM

## 2014-02-07 ENCOUNTER — Inpatient Hospital Stay (HOSPITAL_COMMUNITY)
Admission: AD | Admit: 2014-02-07 | Discharge: 2014-02-07 | Discharge status: Against Medical Advice | Payer: BC Managed Care – PPO | Source: Ambulatory Visit | Attending: Obstetrics & Gynecology | Admitting: Obstetrics & Gynecology

## 2014-02-07 ENCOUNTER — Encounter (HOSPITAL_COMMUNITY): Payer: Self-pay

## 2014-02-07 DIAGNOSIS — O26859 Spotting complicating pregnancy, unspecified trimester: Secondary | ICD-10-CM | POA: Diagnosis not present

## 2014-02-07 LAB — URINE MICROSCOPIC-ADD ON

## 2014-02-07 LAB — URINALYSIS, ROUTINE W REFLEX MICROSCOPIC
Bilirubin Urine: NEGATIVE
GLUCOSE, UA: NEGATIVE mg/dL
Ketones, ur: 15 mg/dL — AB
Nitrite: NEGATIVE
Protein, ur: NEGATIVE mg/dL
Specific Gravity, Urine: 1.025 (ref 1.005–1.030)
UROBILINOGEN UA: 0.2 mg/dL (ref 0.0–1.0)
pH: 6 (ref 5.0–8.0)

## 2014-02-07 NOTE — MAU Note (Signed)
Vaginal spotting since Sunday; brown/red in color. Denies abdominal pain. White discharge.

## 2014-02-13 ENCOUNTER — Inpatient Hospital Stay (HOSPITAL_COMMUNITY)
Admission: AD | Admit: 2014-02-13 | Discharge: 2014-02-13 | Disposition: A | Discharge status: Home / Self Care | Payer: BC Managed Care – PPO | Source: Ambulatory Visit | Attending: Obstetrics & Gynecology | Admitting: Obstetrics & Gynecology

## 2014-02-13 ENCOUNTER — Encounter (HOSPITAL_COMMUNITY): Payer: Self-pay | Admitting: Advanced Practice Midwife

## 2014-02-13 DIAGNOSIS — A749 Chlamydial infection, unspecified: Secondary | ICD-10-CM

## 2014-02-13 DIAGNOSIS — N739 Female pelvic inflammatory disease, unspecified: Secondary | ICD-10-CM | POA: Diagnosis not present

## 2014-02-13 DIAGNOSIS — A5619 Other chlamydial genitourinary infection: Secondary | ICD-10-CM | POA: Diagnosis not present

## 2014-02-13 DIAGNOSIS — O43899 Other placental disorders, unspecified trimester: Secondary | ICD-10-CM | POA: Diagnosis not present

## 2014-02-13 DIAGNOSIS — O210 Mild hyperemesis gravidarum: Secondary | ICD-10-CM | POA: Insufficient documentation

## 2014-02-13 DIAGNOSIS — O26859 Spotting complicating pregnancy, unspecified trimester: Secondary | ICD-10-CM | POA: Insufficient documentation

## 2014-02-13 DIAGNOSIS — O43892 Other placental disorders, second trimester: Secondary | ICD-10-CM

## 2014-02-13 DIAGNOSIS — O368990 Maternal care for other specified fetal problems, unspecified trimester, not applicable or unspecified: Secondary | ICD-10-CM | POA: Insufficient documentation

## 2014-02-13 DIAGNOSIS — R109 Unspecified abdominal pain: Secondary | ICD-10-CM | POA: Insufficient documentation

## 2014-02-13 DIAGNOSIS — O219 Vomiting of pregnancy, unspecified: Secondary | ICD-10-CM

## 2014-02-13 DIAGNOSIS — O98319 Other infections with a predominantly sexual mode of transmission complicating pregnancy, unspecified trimester: Secondary | ICD-10-CM | POA: Diagnosis not present

## 2014-02-13 DIAGNOSIS — O98812 Other maternal infectious and parasitic diseases complicating pregnancy, second trimester: Secondary | ICD-10-CM

## 2014-02-13 LAB — CBC
HCT: 30.1 % — ABNORMAL LOW (ref 36.0–46.0)
Hemoglobin: 9.9 g/dL — ABNORMAL LOW (ref 12.0–15.0)
MCH: 28 pg (ref 26.0–34.0)
MCHC: 32.9 g/dL (ref 30.0–36.0)
MCV: 85.3 fL (ref 78.0–100.0)
PLATELETS: 253 10*3/uL (ref 150–400)
RBC: 3.53 MIL/uL — ABNORMAL LOW (ref 3.87–5.11)
RDW: 16.1 % — ABNORMAL HIGH (ref 11.5–15.5)
WBC: 6.5 10*3/uL (ref 4.0–10.5)

## 2014-02-13 LAB — URINALYSIS, ROUTINE W REFLEX MICROSCOPIC
BILIRUBIN URINE: NEGATIVE
Glucose, UA: NEGATIVE mg/dL
Nitrite: NEGATIVE
PH: 6 (ref 5.0–8.0)
Protein, ur: 30 mg/dL — AB
Specific Gravity, Urine: 1.03 (ref 1.005–1.030)
Urobilinogen, UA: 0.2 mg/dL (ref 0.0–1.0)

## 2014-02-13 LAB — URINE MICROSCOPIC-ADD ON

## 2014-02-13 MED ORDER — AZITHROMYCIN 250 MG PO TABS
1000.0000 mg | ORAL_TABLET | Freq: Once | ORAL | Status: AC
  Administered 2014-02-13: 1000 mg via ORAL
  Filled 2014-02-13: qty 4

## 2014-02-13 MED ORDER — METOCLOPRAMIDE HCL 10 MG PO TABS: 10.0000 mg | ORAL_TABLET | Freq: Three times a day (TID) | ORAL | Status: DC

## 2014-02-13 MED ORDER — SODIUM CHLORIDE 0.9 % IV SOLN
25.0000 mg | Freq: Once | INTRAVENOUS | Status: DC
  Filled 2014-02-13: qty 1

## 2014-02-13 MED ORDER — METOCLOPRAMIDE HCL 10 MG PO TABS
10.0000 mg | ORAL_TABLET | Freq: Once | ORAL | Status: AC
  Administered 2014-02-13: 10 mg via ORAL
  Filled 2014-02-13: qty 1

## 2014-02-13 MED ORDER — METRONIDAZOLE 500 MG PO TABS: 500.0000 mg | ORAL_TABLET | Freq: Two times a day (BID) | ORAL | Status: DC

## 2014-02-13 MED ORDER — ROBINUL 1 MG PO TABS: 1.0000 mg | ORAL_TABLET | Freq: Three times a day (TID) | ORAL | Status: DC | PRN

## 2014-02-13 NOTE — MAU Provider Note (Signed)
Chief Complaint: No chief complaint on file.   First Provider Initiated Contact with Patient 02/13/14 0126     SUBJECTIVE HPI: Amy Mclean is a 23 y.o. G3P2002 at [redacted]w[redacted]d by LMP who presents with continued scant brown spotting, mild low abdominal cramping, nausea and vomiting and weakness. Seen in MAU 12/28/13 for spotting. Dx live SIUP w/ SCH, Chlamydia and BV. Called w/ Results and antibiotics were electronically prescribed, but patient states the prescriptions were not there when she went to a pharmacy. She and her partner have not been treated. Reports vomiting twice the past day. Assessment nausea and vomiting throughout the pregnancy. Hasn't tried anything for it. Was prescribed Phenergan last month, but did not fill the prescription.  O Pos.   Past Medical History  Diagnosis Date  . No pertinent past medical history   . Urinary tract infection   . Chlamydia    OB History  Gravida Para Term Preterm AB SAB TAB Ectopic Multiple Living  0 0 0 0 0 0 2    # Outcome Date GA Lbr Len/2nd Weight Sex Delivery Anes PTL Lv  3 CUR           2 TRM      SVD   Y  1 TRM      SVD   Y     Past Surgical History  Procedure Laterality Date  . No past surgeries     History   Social History  . Marital Status: Single    Spouse Name: N/A    Number of Children: N/A  . Years of Education: N/A   Occupational History  . Not on file.   Social History Main Topics  . Smoking status: Never Smoker   . Smokeless tobacco: Never Used  . Alcohol Use: No  . Drug Use: No  . Sexual Activity: Not Currently   Other Topics Concern  . Not on file   Social History Narrative  . No narrative on file   No current facility-administered medications on file prior to encounter.   Current Outpatient Prescriptions on File Prior to Encounter  Medication Sig Dispense Refill  . Prenatal Vit-Fe Fumarate-FA (PRENATAL MULTIVITAMIN) TABS tablet Take 1 tablet by mouth daily at 12 noon.      . promethazine  (PHENERGAN) 25 MG tablet Take 1 tablet (25 mg total) by mouth every 6 (six) hours as needed for nausea or vomiting.  30 tablet  0   No Known Allergies  ROS: Pertinent items in HPI  OBJECTIVE Last menstrual period 11/12/2013. GENERAL: Well-developed, well-nourished female in no acute distress.  HEENT: Normocephalic. Mucous membranes moist. HEART: normal rate RESP: normal effort ABDOMEN: Soft, non-tender. Gravid, size equals date. No CVA tenderness. EXTREMITIES: Nontender, no edema NEURO: Alert and oriented SPECULUM EXAM: Declined. Scant blood on pad. FHR 155 by doppler.   LAB RESULTS Results for orders placed during the hospital encounter of 02/13/14 (from the past 24 hour(s))  URINALYSIS, ROUTINE W REFLEX MICROSCOPIC     Status: Abnormal   Collection Time    02/13/14 12:35 AM      Result Value Ref Range   Color, Urine YELLOW  YELLOW   APPearance CLEAR  CLEAR   Specific Gravity, Urine 1.030  1.005 - 1.030   pH 6.0  5.0 - 8.0   Glucose, UA NEGATIVE  NEGATIVE mg/dL   Hgb urine dipstick LARGE (*) NEGATIVE   Bilirubin Urine NEGATIVE  NEGATIVE   Ketones, ur >80 (*) NEGATIVE  mg/dL   Protein, ur 30 (*) NEGATIVE mg/dL   Urobilinogen, UA 0.2  0.0 - 1.0 mg/dL   Nitrite NEGATIVE  NEGATIVE   Leukocytes, UA SMALL (*) NEGATIVE  URINE MICROSCOPIC-ADD ON     Status: Abnormal   Collection Time    02/13/14 12:35 AM      Result Value Ref Range   Squamous Epithelial / LPF RARE  RARE   WBC, UA 7-10  <3 WBC/hpf   RBC / HPF 11-20  <3 RBC/hpf   Bacteria, UA FEW (*) RARE   Urine-Other MUCOUS PRESENT    CBC     Status: Abnormal   Collection Time    02/13/14  1:30 AM      Result Value Ref Range   WBC 6.5  4.0 - 10.5 K/uL   RBC 3.53 (*) 3.87 - 5.11 MIL/uL   Hemoglobin 9.9 (*) 12.0 - 15.0 g/dL   HCT 29.5 (*) 62.1 - 30.8 %   MCV 85.3  78.0 - 100.0 fL   MCH 28.0  26.0 - 34.0 pg   MCHC 32.9  30.0 - 36.0 g/dL   RDW 65.7 (*) 84.6 - 96.2 %   Platelets 253  150 - 400 K/uL    IMAGING No  results found.  MAU COURSE UA, CBC. Offered IV antibiotics and fluid, but patient thinks she can tolerate by mouth meds. Reglan by mouth (reports severe sedation from phenergan in the past), azithromycin.  Feeling better after Reglan. Azithromycin given. Comment is a small amount 30-40 minutes after his symptoms, but no pills present in the emesis. Tolerating crackers and water afterward. Feels ready to go home.  ASSESSMENT 1. Nausea and vomiting of pregnancy, antepartum   2. Subchorionic hematoma, second trimester   3. Chlamydia infection affecting pregnancy in second trimester    PLAN Discharge home in stable condition. Partner needs treatment. No intercourse until his one week after both of them treated. Needs test of cure at next office visit. Pregnancy verification letter given.   Follow-up Information   Follow up with [facility]. (Start prenatal care)    Contact information:   [address]  [address] [place] [address] [phone]       Follow up with [facility]. (As needed in emergencies)    Contact information:   [address] [id number] [place] [address] [phone]        Medication List         metoCLOPramide 10 MG tablet  Commonly known as:  REGLAN  Take 1 tablet (10 mg total) by mouth 3 (three) times daily with meals.     metroNIDAZOLE 500 MG tablet  Commonly known as:  FLAGYL  Take 1 tablet (500 mg total) by mouth 2 (two) times daily.     prenatal multivitamin Tabs tablet  Take 1 tablet by mouth daily at 12 noon.     promethazine 25 MG tablet  Commonly known as:  PHENERGAN  Take 1 tablet (25 mg total) by mouth every 6 (six) hours as needed for nausea or vomiting.     ROBINUL 1 MG tablet  Generic drug:  glycopyrrolate  Take 1-2 tablets (1-2 mg total) by mouth 3 (three) times daily as needed.         Fox [place], CNM 02/13/2014  12:20 AM

## 2014-02-13 NOTE — MAU Note (Signed)
Pt stated she started spotting on off for about a weekt and mild cramping. Stated she came last week but was unable to stay for evaluation

## 2014-02-13 NOTE — MAU Provider Note (Signed)
Attestation of Attending Supervision of Advanced Practitioner (CNM/NP): Evaluation and management procedures were performed by the Advanced Practitioner under my supervision and collaboration.  I have reviewed the Advanced Practitioner's note and chart, and I agree with the management and plan.  HARRAWAY-SMITH, Kimani Bedoya 7:16 AM     

## 2014-02-13 NOTE — Discharge Instructions (Signed)
Your partner needs to go to the [facility] or his Primary Care Provider for treatment of Chlamydia, Judi Cong not have sex until at least 1 week after you both have been treated. You should have a Chlamydia test of cure at your prenatal appointment.   Chlamydia Chlamydia is an infection. It is spread through sexual contact. Chlamydia can be in different areas of the body. These areas include the cervix, urethra, throat, or rectum. You may not know you have chlamydia because many people never develop the symptoms. Chlamydia is not difficult to treat once you know you have it. However, if it is left untreated, chlamydia can lead to more serious health problems.  CAUSES  Chlamydia is caused by bacteria. It is a sexually transmitted disease. It is passed from an infected partner during intimate contact. This contact could be with the genitals, mouth, or rectal area. Chlamydia can also be passed from mothers to babies during birth. SIGNS AND SYMPTOMS  There may not be any symptoms. This is often the case early in the infection. If symptoms develop, they may include:  Mild pain and discomfort when urinating.  Redness, soreness, and swelling (inflammation) of the rectum.  Vaginal discharge.  Painful intercourse.  Abdominal pain.  Bleeding between menstrual periods. DIAGNOSIS  To diagnose this infection, your health care provider will do a pelvic exam. Cultures will be taken of the vagina, cervix, urine, and possibly the rectum to verify the diagnosis.  TREATMENT You will be given antibiotic medicines. If you are pregnant, certain types of antibiotics will need to be avoided. Any sexual partners should also be treated, even if they do not show symptoms.  HOME CARE INSTRUCTIONS   Take your antibiotic medicine as directed by your health care provider. Finish the antibiotic even if you start to feel better.  Take medicines only as directed by your health care provider.  Inform any sexual partners  about the infection. They should also be treated.  Do not have sexual contact until your health care provider tells you it is okay.  Get plenty of rest.  Eat a well-balanced diet.  Drink enough fluids to keep your urine clear or pale yellow.  Keep all follow-up visits as directed by your health care provider. SEEK MEDICAL CARE IF:  You have painful urination.  You have abdominal pain.  You have vaginal discharge.  You have painful sexual intercourse.  You have bleeding between periods and after sex.  You have a fever. SEEK IMMEDIATE MEDICAL CARE IF:   You experience nausea or vomiting.  You experience excessive sweating (diaphoresis).  You have difficulty swallowing. MAKE SURE YOU:   Understand these instructions.  Will watch your condition.  Will get help right away if you are not doing well or get worse. Document Released: 03/14/2005 Document Revised: 10/19/2013 Document Reviewed: 02/09/2013 [facility] Patient Information 2015 [place], [place]. This information is not intended to replace advice given to you by your health care provider. Make sure you discuss any questions you have with your health care provider.  Morning Sickness Morning sickness is when you feel sick to your stomach (nauseous) during pregnancy. This nauseous feeling may or may not come with vomiting. It often occurs in the morning but can be a problem any time of day. Morning sickness is most common during the first trimester, but it may continue throughout pregnancy. While morning sickness is unpleasant, it is usually harmless unless you develop severe and continual vomiting (hyperemesis gravidarum). This condition requires more intense treatment.  CAUSES  The cause of morning sickness is not completely known but seems to be related to normal hormonal changes that occur in pregnancy. RISK FACTORS You are at greater risk if you:  Experienced nausea or vomiting before your pregnancy.  Had morning  sickness during a previous pregnancy.  Are pregnant with more than one baby, such as twins. TREATMENT  Do not use any medicines (prescription, over-the-counter, or herbal) for morning sickness without first talking to your health care provider. Your health care provider may prescribe or recommend:  Vitamin B6 supplements.  Anti-nausea medicines.  The herbal medicine ginger. HOME CARE INSTRUCTIONS   Only take over-the-counter or prescription medicines as directed by your health care provider.  Taking multivitamins before getting pregnant can prevent or decrease the severity of morning sickness in most women.  Eat a piece of dry toast or unsalted crackers before getting out of bed in the morning.  Eat five or six small meals a day.  Eat dry and bland foods (rice, baked potato). Foods high in carbohydrates are often helpful.  Do not drink liquids with your meals. Drink liquids between meals.  Avoid greasy, fatty, and spicy foods.  Get someone to cook for you if the smell of any food causes nausea and vomiting.  If you feel nauseous after taking prenatal vitamins, take the vitamins at night or with a snack.  Snack on protein foods (nuts, yogurt, cheese) between meals if you are hungry.  Eat unsweetened gelatins for desserts.  Wearing an acupressure wristband (worn for sea sickness) may be helpful.  Acupuncture may be helpful.  Do not smoke.  Get a humidifier to keep the air in your house free of odors.  Get plenty of fresh air. SEEK MEDICAL CARE IF:   Your home remedies are not working, and you need medicine.  You feel dizzy or lightheaded.  You are losing weight. SEEK IMMEDIATE MEDICAL CARE IF:   You have persistent and uncontrolled nausea and vomiting.  You pass out (faint). MAKE SURE YOU:  Understand these instructions.  Will watch your condition.  Will get help right away if you are not doing well or get worse. Document Released: 07/26/2006 Document  Revised: 06/09/2013 Document Reviewed: 11/19/2012 [facility] Patient Information 2015 [place], [place]. This information is not intended to replace advice given to you by your health care provider. Make sure you discuss any questions you have with your health care provider.

## 2014-02-14 LAB — CULTURE, OB URINE
COLONY COUNT: NO GROWTH
Culture: NO GROWTH
Special Requests: NORMAL

## 2014-03-01 ENCOUNTER — Inpatient Hospital Stay (HOSPITAL_COMMUNITY)
Admission: AD | Admit: 2014-03-01 | Discharge: 2014-03-01 | Disposition: A | Discharge status: Home / Self Care | Payer: BC Managed Care – PPO | Source: Ambulatory Visit | Attending: Obstetrics & Gynecology | Admitting: Obstetrics & Gynecology

## 2014-03-01 ENCOUNTER — Encounter (HOSPITAL_COMMUNITY): Payer: Self-pay | Admitting: *Deleted

## 2014-03-01 DIAGNOSIS — O4690 Antepartum hemorrhage, unspecified, unspecified trimester: Secondary | ICD-10-CM | POA: Diagnosis not present

## 2014-03-01 DIAGNOSIS — O209 Hemorrhage in early pregnancy, unspecified: Secondary | ICD-10-CM | POA: Insufficient documentation

## 2014-03-01 DIAGNOSIS — O4692 Antepartum hemorrhage, unspecified, second trimester: Secondary | ICD-10-CM

## 2014-03-01 HISTORY — DX: Anemia, unspecified: D64.9

## 2014-03-01 NOTE — Discharge Instructions (Signed)
Obtain Prenatal Care ASAP  Pelvic Rest Pelvic rest is sometimes recommended for women when:   The placenta is partially or completely covering the opening of the cervix (placenta previa).  There is bleeding between the uterine wall and the amniotic sac in the first trimester (subchorionic hemorrhage).  The cervix begins to open without labor starting (incompetent cervix, cervical insufficiency).  The labor is too early (preterm labor). HOME CARE INSTRUCTIONS  Do not have sexual intercourse, stimulation, or an orgasm.  Do not use tampons, douche, or put anything in the vagina.  Do not lift anything over 10 pounds (4.5 kg).  Avoid strenuous activity or straining your pelvic muscles. SEEK MEDICAL CARE IF:  You have any vaginal bleeding during pregnancy. Treat this as a potential emergency.  You have cramping pain felt low in the stomach (stronger than menstrual cramps).  You notice vaginal discharge (watery, mucus, or bloody).  You have a low, dull backache.  There are regular contractions or uterine tightening. SEEK IMMEDIATE MEDICAL CARE IF: You have vaginal bleeding and have placenta previa.  Document Released: 09/29/2010 Document Revised: 08/27/2011 Document Reviewed: 09/29/2010 [facility] Patient Information 2015 [place], [place]. This information is not intended to replace advice given to you by your health care provider. Make sure you discuss any questions you have with your health care provider.  Vaginal Bleeding During Pregnancy, Second Trimester A small amount of bleeding (spotting) from the vagina is relatively common in pregnancy. It usually stops on its own. Various things can cause bleeding or spotting in pregnancy. Some bleeding may be related to the pregnancy, and some may not. Sometimes the bleeding is normal and is not a problem. However, bleeding can also be a sign of something serious. Be sure to tell your health care provider about any vaginal bleeding right  away. Some possible causes of vaginal bleeding during the second trimester include:  Infection, inflammation, or growths on the cervix.   The placenta may be partially or completely covering the opening of the cervix inside the uterus (placenta previa).  The placenta may have separated from the uterus (abruption of the placenta).   You may be having early (preterm) labor.   The cervix may not be strong enough to keep a baby inside the uterus (cervical insufficiency).   Tiny cysts may have developed in the uterus instead of pregnancy tissue (molar pregnancy). HOME CARE INSTRUCTIONS  Watch your condition for any changes. The following actions may help to lessen any discomfort you are feeling:  Follow your health care provider's instructions for limiting your activity. If your health care provider orders bed rest, you may need to stay in bed and only get up to use the bathroom. However, your health care provider may allow you to continue light activity.  If needed, make plans for someone to help with your regular activities and responsibilities while you are on bed rest.  Keep track of the number of pads you use each day, how often you change pads, and how soaked (saturated) they are. Write this down.  Do not use tampons. Do not douche.  Do not have sexual intercourse or orgasms until approved by your health care provider.  If you pass any tissue from your vagina, save the tissue so you can show it to your health care provider.  Only take over-the-counter or prescription medicines as directed by your health care provider.  Do not take aspirin because it can make you bleed.  Do not exercise or perform any strenuous activities  or heavy lifting without your health care provider's permission.  Keep all follow-up appointments as directed by your health care provider. SEEK MEDICAL CARE IF:  You have any vaginal bleeding during any part of your pregnancy.  You have cramps or labor  pains.  You have a fever, not controlled by medicine. SEEK IMMEDIATE MEDICAL CARE IF:   You have severe cramps in your back or belly (abdomen).  You have contractions.  You have chills.  You pass large clots or tissue from your vagina.  Your bleeding increases.  You feel light-headed or weak, or you have fainting episodes.  You are leaking fluid or have a gush of fluid from your vagina. MAKE SURE YOU:  Understand these instructions.  Will watch your condition.  Will get help right away if you are not doing well or get worse. Document Released: 03/14/2005 Document Revised: 06/09/2013 Document Reviewed: 02/09/2013 [facility] Patient Information 2015 [place], [place]. This information is not intended to replace advice given to you by your health care provider. Make sure you discuss any questions you have with your health care provider.

## 2014-03-01 NOTE — MAU Provider Note (Signed)
History     CSN: [id number]  Arrival date and time: 03/01/14 1723   First Provider Initiated Contact with Patient 03/01/14 1933      Chief Complaint  Patient presents with  . Vaginal Bleeding   HPI Amy Mclean 23 y.o. [id number]  presents to MAU with complaint of vaginal bleeding.  Since gestational age of 7-8 weeks, she was noticing pinkish discharge and was told she had a subchorionic hemorrhage.  She notes she is now having more bleeding like a period.  She has noticed it off and on throughout the day.  She has not had pads to be able to quantify the bleeding.  Earlier today she noted fetal movement.  She denies abdominal pain, dysuria, nausea or vomiting.   She has not yet begun prenatal care.   OB History   Grav Para Term Preterm Abortions TAB SAB Ect Mult Living   0 0 0 0 0 0 2      Past Medical History  Diagnosis Date  . No pertinent past medical history   . Urinary tract infection   . Chlamydia   . Anemia     Past Surgical History  Procedure Laterality Date  . No past surgeries      Family History  Problem Relation Age of Onset  . Alcohol abuse Neg Hx   . Arthritis Neg Hx   . Birth defects Neg Hx   . Asthma Neg Hx   . Cancer Neg Hx   . COPD Neg Hx   . Depression Neg Hx   . Diabetes Neg Hx   . Drug abuse Neg Hx   . Early death Neg Hx   . Hearing loss Neg Hx   . Heart disease Neg Hx   . Hyperlipidemia Neg Hx   . Hypertension Neg Hx   . Kidney disease Neg Hx   . Learning disabilities Neg Hx   . Mental illness Neg Hx   . Mental retardation Neg Hx   . Miscarriages / Stillbirths Neg Hx   . Vision loss Neg Hx   . Stroke Neg Hx     History  Substance Use Topics  . Smoking status: Never Smoker   . Smokeless tobacco: Never Used  . Alcohol Use: No    Allergies: No Known Allergies  Prescriptions prior to admission  Medication Sig Dispense Refill  . metoCLOPramide (REGLAN) 10 MG tablet Take 1 tablet (10 mg total) by mouth 3 (three)  times daily with meals.  90 tablet  1  . metroNIDAZOLE (FLAGYL) 500 MG tablet Take 1 tablet (500 mg total) by mouth 2 (two) times daily.  14 tablet  0  . Prenatal Vit-Fe Fumarate-FA (PRENATAL MULTIVITAMIN) TABS tablet Take 1 tablet by mouth daily at 12 noon.      . promethazine (PHENERGAN) 25 MG tablet Take 1 tablet (25 mg total) by mouth every 6 (six) hours as needed for nausea or vomiting.  30 tablet  0  . ROBINUL 1 MG tablet Take 1-2 tablets (1-2 mg total) by mouth 3 (three) times daily as needed.  60 tablet  1    Review of Systems  Constitutional: Negative for fever, chills and diaphoresis.  HENT: Negative for congestion and sore throat.   Respiratory: Negative for shortness of breath and wheezing.   Cardiovascular: Negative for chest pain and palpitations.  Gastrointestinal: Negative for heartburn, nausea, vomiting, abdominal pain, diarrhea and constipation.  Genitourinary: Negative for dysuria, frequency and hematuria.  Skin:  Negative for itching and rash.  Neurological: Negative for dizziness, tingling, weakness and headaches.  Psychiatric/Behavioral: Negative for depression, suicidal ideas and substance abuse. The patient is not nervous/anxious.    Physical Exam   Blood pressure 112/74, pulse 111, temperature 98.4 F (36.9 C), temperature source Oral, resp. rate 16, last menstrual period 11/12/2013.  Physical Exam  Constitutional: She is oriented to person, place, and time. She appears well-developed and well-nourished. No distress.  HENT:  Head: Normocephalic and atraumatic.  Eyes: EOM are normal.  Neck: Normal range of motion.  Cardiovascular: Regular rhythm and normal heart sounds.  Exam reveals no gallop and no friction rub.   No murmur heard. Tachycardic at 111  Respiratory: Effort normal and breath sounds normal. No respiratory distress.  GI: Soft. Bowel sounds are normal. She exhibits no distension.  Genitourinary:  Bimanual exam reveals small amt of bleeding, no  uterine/adnexal tenderness and cervix is closed.    Musculoskeletal: Normal range of motion.  Neurological: She is alert and oriented to person, place, and time.  Skin: Skin is warm and dry.  Psychiatric: She has a normal mood and affect.    MAU Course  Procedures  MDM Known IUP with subchorionic hemorrhage.  FHR normal.  Closed cervix.  Assessment and Plan  A: 15 weeks, known subchorionic hemorrhage with vaginal bleeding  P: Discharge to home Pelvic rest Obtain [facility] asap Return to MAU for emergency  Bertram Denver 03/01/2014, 7:33 PM

## 2014-03-01 NOTE — MAU Note (Signed)
Has been spotting off and on the past month. This week is became heavier, today is almost like a period, bright red. No clots.  Not having any pain.  Was told at 8 wks she had a subchorionic hemorrhage.

## 2014-03-04 NOTE — MAU Provider Note (Signed)
Attestation of Attending Supervision of Advanced Practitioner (PA/CNM/NP): Evaluation and management procedures were performed by the Advanced Practitioner under my supervision and collaboration.  I have reviewed the Advanced Practitioner's note and chart, and I agree with the management and plan.  Derl Abalos, DO Attending Physician Faculty Practice, [facility]  

## 2014-03-22 LAB — OB RESULTS CONSOLE ABO/RH: RH Type: POSITIVE

## 2014-03-22 LAB — OB RESULTS CONSOLE GC/CHLAMYDIA
CHLAMYDIA, DNA PROBE: NEGATIVE
Gonorrhea: NEGATIVE

## 2014-03-22 LAB — OB RESULTS CONSOLE RUBELLA ANTIBODY, IGM: RUBELLA: IMMUNE

## 2014-03-22 LAB — OB RESULTS CONSOLE HIV ANTIBODY (ROUTINE TESTING): HIV: NONREACTIVE

## 2014-03-22 LAB — OB RESULTS CONSOLE HEPATITIS B SURFACE ANTIGEN: HEP B S AG: NEGATIVE

## 2014-03-22 LAB — OB RESULTS CONSOLE ANTIBODY SCREEN: Antibody Screen: NEGATIVE

## 2014-03-22 LAB — OB RESULTS CONSOLE RPR: RPR: NONREACTIVE

## 2014-03-25 ENCOUNTER — Encounter: Payer: Self-pay | Admitting: Obstetrics & Gynecology

## 2014-04-19 ENCOUNTER — Encounter (HOSPITAL_COMMUNITY): Payer: Self-pay | Admitting: *Deleted

## 2014-06-14 ENCOUNTER — Encounter: Payer: Self-pay | Admitting: *Deleted

## 2014-06-15 ENCOUNTER — Encounter: Payer: Self-pay | Admitting: Obstetrics & Gynecology

## 2014-06-18 NOTE — L&D Delivery Note (Addendum)
Delivery Note At 11:19 AM a viable and healthy female was delivered via Vaginal, Spontaneous Delivery (Presentation: Left Occiput Anterior).  APGAR: 8, 9; weight  P.   Placenta status: Intact, Spontaneous.  Cord: 3 vessels with the following complications: True Knot, tight nuchal  Anesthesia: Epidural  Episiotomy:  none Lacerations: 1st degree, labial lac; hemostatic R labial, periurethral Suture Repair: 3.0 vicryl rapide Est. Blood Loss (mL): 400cc  Mom to postpartum.  Baby to Couplet care / Skin to Skin.  Bovard-Stuckert, Talitha Dicarlo 08/23/2014, 11:39 AM  Bo/Nexplanon/Tdap in [facility]/ [place]

## 2014-07-21 LAB — OB RESULTS CONSOLE GBS: STREP GROUP B AG: NEGATIVE

## 2014-08-19 ENCOUNTER — Telehealth (HOSPITAL_COMMUNITY): Payer: Self-pay | Admitting: *Deleted

## 2014-08-19 ENCOUNTER — Encounter (HOSPITAL_COMMUNITY): Payer: Self-pay | Admitting: *Deleted

## 2014-08-19 NOTE — Telephone Encounter (Signed)
Preadmission screen  

## 2014-08-20 ENCOUNTER — Encounter (HOSPITAL_COMMUNITY): Payer: Self-pay | Admitting: *Deleted

## 2014-08-20 ENCOUNTER — Inpatient Hospital Stay (HOSPITAL_COMMUNITY): Payer: Medicaid Other

## 2014-08-20 ENCOUNTER — Inpatient Hospital Stay (HOSPITAL_COMMUNITY)
Admission: AD | Admit: 2014-08-20 | Discharge: 2014-08-20 | Disposition: A | Discharge status: Home / Self Care | Payer: Medicaid Other | Source: Ambulatory Visit | Attending: Obstetrics and Gynecology | Admitting: Obstetrics and Gynecology

## 2014-08-20 DIAGNOSIS — O288 Other abnormal findings on antenatal screening of mother: Secondary | ICD-10-CM | POA: Insufficient documentation

## 2014-08-20 DIAGNOSIS — O9989 Other specified diseases and conditions complicating pregnancy, childbirth and the puerperium: Secondary | ICD-10-CM | POA: Diagnosis present

## 2014-08-20 DIAGNOSIS — O480 Post-term pregnancy: Secondary | ICD-10-CM | POA: Insufficient documentation

## 2014-08-20 DIAGNOSIS — Z3A40 40 weeks gestation of pregnancy: Secondary | ICD-10-CM | POA: Diagnosis not present

## 2014-08-20 DIAGNOSIS — IMO0002 Reserved for concepts with insufficient information to code with codable children: Secondary | ICD-10-CM

## 2014-08-20 LAB — WET PREP, GENITAL
Trich, Wet Prep: NONE SEEN
Yeast Wet Prep HPF POC: NONE SEEN

## 2014-08-20 LAB — POCT FERN TEST

## 2014-08-20 NOTE — Discharge Instructions (Signed)

## 2014-08-20 NOTE — MAU Provider Note (Signed)
Amy Mclean is a 24 y.o. G3P2002 at 6566w2d  who presents to MAU today complaining of LOF. She states occasional mild contractions. She states "trickling" of fluid since Wednesday. She denies vaginal bleeding. She reports good fetal movement.    BP 119/75 mmHg  Pulse 101  Temp(Src) 98.1 F (36.7 C) (Oral)  Resp 18  LMP 11/12/2013 GENERAL: Well-developed, well-nourished female in no acute distress.  HEENT: Normocephalic, atraumatic.  LUNGS: Normal effort HEART: Regular rate  ABDOMEN: Soft, nontender, nondistended.  PELVIC: Normal external female genitalia. Vagina is pink and rugated.  Small amount of thin, frothy discharge.  Negative pooling. Gravid uterus.   EXTREMITIES: No cyanosis, clubbing, or edema  Fern - negative  Fetal Monitoring:  Baseline: 140 bpm, moderate variability at times, minimal variability at times, 10 x 10 accelerations, no decelerations Contractions: irregular  A: Membranes intact  P: Report given to RN to contact MD on call for further instructions  Marny LowensteinJulie N Wenzel, PA-C 08/20/2014 3:25 PM

## 2014-08-20 NOTE — MAU Note (Signed)
Pt states she has had ? Leaking since Wednesday, frequently feels a trickle, no gushing, underwear is often wet.  Denies uc's but states she has severe pelvic pressure.  Denies bleeding.

## 2014-08-22 NOTE — H&P (Signed)
Amy Mclean is a 24 y.o. female G3P2002 at 4140+ for Dated by early US cw LMP, [facility]  D/W pt r/b/a of IOL.   Maternal Medical History:  Contractions: Frequency: irregular.    Fetal activity: Perceived fetal activity is normal.    Prenatal complications: no prenatal complications Prenatal Complications - Diabetes: none.    OB History    Gravida Para Term Preterm AB TAB SAB Ectopic Multiple Living   3 2 2  0 0 0 0 0 0 2    G1 SVD G2 SVD G3 present  No abn pap, + Chl  Past Medical History  Diagnosis Date  . No pertinent past medical history   . Urinary tract infection   . Chlamydia   . Anemia   . Hx of varicella    Past Surgical History  Procedure Laterality Date  . No past surgeries     Family History: family history includes Hypertension in her father and maternal grandmother. There is no history of Alcohol abuse, Arthritis, Birth defects, Asthma, Cancer, COPD, Depression, Diabetes, Drug abuse, Early death, Hearing loss, Heart disease, Hyperlipidemia, Kidney disease, Learning disabilities, Mental illness, Mental retardation, Miscarriages / Stillbirths, Vision loss, or Stroke. Social History:  reports that she has never smoked. She has never used smokeless tobacco. She reports that she does not drink alcohol or use illicit drugs.  Single, unemployeed   Prenatal Transfer Tool  Maternal Diabetes: No Genetic Screening: Declined Maternal Ultrasounds/Referrals: Normal Fetal Ultrasounds or other Referrals:  None Maternal Substance Abuse:  No Significant Maternal Medications:  None Significant Maternal Lab Results:  Lab values include: Group B Strep negative Other Comments:  None  Review of Systems  Constitutional: Negative.   HENT: Negative.   Eyes: Negative.   Respiratory: Negative.   Cardiovascular: Negative.   Gastrointestinal: Negative.   Genitourinary: Negative.   Musculoskeletal: Negative.   Skin: Negative.   Neurological: Negative.    Psychiatric/Behavioral: Negative.       Last menstrual period 11/12/2013. Maternal Exam:  Abdomen: Patient reports no abdominal tenderness. Fundal height is appropriate for gestatiob.   Estimated fetal weight is 7-8#.   Fetal presentation: vertex  Introitus: Normal vulva. Normal vagina.  Pelvis: adequate for delivery.   Cervix: Cervix evaluated by sterile speculum exam.     Physical Exam  Constitutional: She is oriented to person, place, and time. She appears well-developed and well-nourished.  HENT:  Head: Normocephalic and atraumatic.  Cardiovascular: Normal rate and regular rhythm.   Respiratory: Effort normal and breath sounds normal. No respiratory distress. She has no wheezes.  GI: Soft. Bowel sounds are normal. She exhibits no distension. There is no tenderness.  Musculoskeletal: Normal range of motion.  Neurological: She is alert and oriented to person, place, and time.  Skin: Skin is warm and dry.  Psychiatric: She has a normal mood and affect. Her behavior is normal.    Prenatal labs: ABO, Rh: O/Positive/-- (10/05 0000) Antibody: Negative (10/05 0000) Rubella: Immune (10/05 0000) RPR: Nonreactive (10/05 0000)  HBsAg: Negative (10/05 0000)  HIV: Non-reactive (10/05 0000)  GBS: Negative (02/03 0000)   Hgb 12.8/Plt 260K/UrCx neg/ GC neg/ Chl neg/glucola 88  Preg dated by LMP cw early US bl US cwd, nl anat,post plac, female  Assessment/Plan: 24yo G3P2002 at 40+ for IOL given term and favorable cervix gbbs neg Epidural prn Pitocin and AROM to sugment Expect SVD   Bovard-Stuckert, Brance Dartt 08/22/2014, 10:03 PM

## 2014-08-23 ENCOUNTER — Inpatient Hospital Stay (HOSPITAL_COMMUNITY)
Admission: RE | Admit: 2014-08-23 | Discharge: 2014-08-25 | DRG: 775 | Disposition: A | Discharge status: Home / Self Care | Payer: Medicaid Other | Source: Ambulatory Visit | Attending: Obstetrics and Gynecology | Admitting: Obstetrics and Gynecology

## 2014-08-23 ENCOUNTER — Encounter (HOSPITAL_COMMUNITY): Payer: Self-pay | Admitting: Certified Registered Nurse Anesthetist

## 2014-08-23 ENCOUNTER — Inpatient Hospital Stay (HOSPITAL_COMMUNITY): Payer: Medicaid Other | Admitting: Anesthesiology

## 2014-08-23 ENCOUNTER — Encounter (HOSPITAL_COMMUNITY): Payer: Self-pay

## 2014-08-23 DIAGNOSIS — O1495 Unspecified pre-eclampsia, complicating the puerperium: Secondary | ICD-10-CM

## 2014-08-23 DIAGNOSIS — Z3A40 40 weeks gestation of pregnancy: Secondary | ICD-10-CM | POA: Diagnosis present

## 2014-08-23 DIAGNOSIS — Z3480 Encounter for supervision of other normal pregnancy, unspecified trimester: Secondary | ICD-10-CM

## 2014-08-23 LAB — CBC
HCT: 32.9 % — ABNORMAL LOW (ref 36.0–46.0)
HEMOGLOBIN: 10.3 g/dL — AB (ref 12.0–15.0)
MCH: 27.8 pg (ref 26.0–34.0)
MCHC: 31.3 g/dL (ref 30.0–36.0)
MCV: 88.7 fL (ref 78.0–100.0)
Platelets: 235 10*3/uL (ref 150–400)
RBC: 3.71 MIL/uL — ABNORMAL LOW (ref 3.87–5.11)
RDW: 16.4 % — AB (ref 11.5–15.5)
WBC: 8.7 10*3/uL (ref 4.0–10.5)

## 2014-08-23 LAB — TYPE AND SCREEN
ABO/RH(D): O POS
Antibody Screen: NEGATIVE

## 2014-08-23 LAB — RPR: RPR Ser Ql: NONREACTIVE

## 2014-08-23 MED ORDER — WITCH HAZEL-GLYCERIN EX PADS: 1.0000 "application " | MEDICATED_PAD | CUTANEOUS | Status: DC | PRN

## 2014-08-23 MED ORDER — SENNOSIDES-DOCUSATE SODIUM 8.6-50 MG PO TABS
2.0000 | ORAL_TABLET | ORAL | Status: DC
  Administered 2014-08-23 – 2014-08-24 (×2): 2 via ORAL
  Filled 2014-08-23 (×2): qty 2

## 2014-08-23 MED ORDER — OXYCODONE-ACETAMINOPHEN 5-325 MG PO TABS: 1.0000 | ORAL_TABLET | ORAL | Status: DC | PRN

## 2014-08-23 MED ORDER — PHENYLEPHRINE 40 MCG/ML (10ML) SYRINGE FOR IV PUSH (FOR BLOOD PRESSURE SUPPORT)
PREFILLED_SYRINGE | INTRAVENOUS | Status: AC
  Filled 2014-08-23: qty 20

## 2014-08-23 MED ORDER — ZOLPIDEM TARTRATE 5 MG PO TABS: 5.0000 mg | ORAL_TABLET | Freq: Every evening | ORAL | Status: DC | PRN

## 2014-08-23 MED ORDER — ONDANSETRON HCL 4 MG PO TABS: 4.0000 mg | ORAL_TABLET | ORAL | Status: DC | PRN

## 2014-08-23 MED ORDER — OXYTOCIN BOLUS FROM INFUSION: 500.0000 mL | INTRAVENOUS | Status: DC

## 2014-08-23 MED ORDER — LIDOCAINE HCL (PF) 1 % IJ SOLN
30.0000 mL | INTRAMUSCULAR | Status: DC | PRN
  Filled 2014-08-23: qty 30

## 2014-08-23 MED ORDER — OXYCODONE-ACETAMINOPHEN 5-325 MG PO TABS: 2.0000 | ORAL_TABLET | ORAL | Status: DC | PRN

## 2014-08-23 MED ORDER — FENTANYL 2.5 MCG/ML BUPIVACAINE 1/10 % EPIDURAL INFUSION (WH - ANES)
14.0000 mL/h | INTRAMUSCULAR | Status: DC | PRN
  Administered 2014-08-23: 14 mL/h via EPIDURAL

## 2014-08-23 MED ORDER — OXYTOCIN 40 UNITS IN LACTATED RINGERS INFUSION - SIMPLE MED
1.0000 m[IU]/min | INTRAVENOUS | Status: DC
  Administered 2014-08-23: 2 m[IU]/min via INTRAVENOUS
  Filled 2014-08-23: qty 1000

## 2014-08-23 MED ORDER — IBUPROFEN 600 MG PO TABS
600.0000 mg | ORAL_TABLET | Freq: Four times a day (QID) | ORAL | Status: DC
  Administered 2014-08-23 – 2014-08-25 (×8): 600 mg via ORAL
  Filled 2014-08-23 (×8): qty 1

## 2014-08-23 MED ORDER — FENTANYL 2.5 MCG/ML BUPIVACAINE 1/10 % EPIDURAL INFUSION (WH - ANES)
INTRAMUSCULAR | Status: AC
  Filled 2014-08-23: qty 125

## 2014-08-23 MED ORDER — OXYCODONE-ACETAMINOPHEN 5-325 MG PO TABS
1.0000 | ORAL_TABLET | ORAL | Status: DC | PRN
  Administered 2014-08-23 – 2014-08-25 (×5): 1 via ORAL
  Filled 2014-08-23 (×5): qty 1

## 2014-08-23 MED ORDER — EPHEDRINE 5 MG/ML INJ
10.0000 mg | INTRAVENOUS | Status: DC | PRN
  Filled 2014-08-23: qty 2

## 2014-08-23 MED ORDER — DIBUCAINE 1 % RE OINT: 1.0000 "application " | TOPICAL_OINTMENT | RECTAL | Status: DC | PRN

## 2014-08-23 MED ORDER — LACTATED RINGERS IV SOLN
INTRAVENOUS | Status: DC
  Administered 2014-08-23: 1000 mL via INTRAVENOUS

## 2014-08-23 MED ORDER — DIPHENHYDRAMINE HCL 25 MG PO CAPS
25.0000 mg | ORAL_CAPSULE | Freq: Four times a day (QID) | ORAL | Status: DC | PRN
Start: 2014-08-23 — End: 2014-08-25

## 2014-08-23 MED ORDER — LIDOCAINE HCL (PF) 1 % IJ SOLN
INTRAMUSCULAR | Status: DC | PRN
  Administered 2014-08-23 (×2): 5 mL

## 2014-08-23 MED ORDER — OXYCODONE-ACETAMINOPHEN 5-325 MG PO TABS
2.0000 | ORAL_TABLET | ORAL | Status: DC | PRN
Start: 2014-08-23 — End: 2014-08-23

## 2014-08-23 MED ORDER — OXYTOCIN 40 UNITS IN LACTATED RINGERS INFUSION - SIMPLE MED: 62.5000 mL/h | INTRAVENOUS | Status: DC

## 2014-08-23 MED ORDER — BUTORPHANOL TARTRATE 1 MG/ML IJ SOLN: 1.0000 mg | INTRAMUSCULAR | Status: DC | PRN

## 2014-08-23 MED ORDER — ONDANSETRON HCL 4 MG/2ML IJ SOLN: 4.0000 mg | Freq: Four times a day (QID) | INTRAMUSCULAR | Status: DC | PRN

## 2014-08-23 MED ORDER — LACTATED RINGERS IV SOLN: INTRAVENOUS | Status: DC

## 2014-08-23 MED ORDER — LANOLIN HYDROUS EX OINT: TOPICAL_OINTMENT | CUTANEOUS | Status: DC | PRN

## 2014-08-23 MED ORDER — PHENYLEPHRINE 40 MCG/ML (10ML) SYRINGE FOR IV PUSH (FOR BLOOD PRESSURE SUPPORT)
80.0000 ug | PREFILLED_SYRINGE | INTRAVENOUS | Status: DC | PRN
  Filled 2014-08-23: qty 2

## 2014-08-23 MED ORDER — LACTATED RINGERS IV SOLN: 500.0000 mL | Freq: Once | INTRAVENOUS | Status: DC

## 2014-08-23 MED ORDER — LACTATED RINGERS IV SOLN: 500.0000 mL | INTRAVENOUS | Status: DC | PRN

## 2014-08-23 MED ORDER — CITRIC ACID-SODIUM CITRATE 334-500 MG/5ML PO SOLN: 30.0000 mL | ORAL | Status: DC | PRN

## 2014-08-23 MED ORDER — FENTANYL 2.5 MCG/ML BUPIVACAINE 1/10 % EPIDURAL INFUSION (WH - ANES)
INTRAMUSCULAR | Status: DC | PRN
  Administered 2014-08-23: 14 mL/h via EPIDURAL

## 2014-08-23 MED ORDER — DIPHENHYDRAMINE HCL 50 MG/ML IJ SOLN: 12.5000 mg | INTRAMUSCULAR | Status: DC | PRN

## 2014-08-23 MED ORDER — BENZOCAINE-MENTHOL 20-0.5 % EX AERO
1.0000 "application " | INHALATION_SPRAY | CUTANEOUS | Status: DC | PRN
  Filled 2014-08-23: qty 56

## 2014-08-23 MED ORDER — ACETAMINOPHEN 325 MG PO TABS: 650.0000 mg | ORAL_TABLET | ORAL | Status: DC | PRN

## 2014-08-23 MED ORDER — PRENATAL MULTIVITAMIN CH
1.0000 | ORAL_TABLET | Freq: Every day | ORAL | Status: DC
  Administered 2014-08-24 – 2014-08-25 (×2): 1 via ORAL
  Filled 2014-08-23 (×2): qty 1

## 2014-08-23 MED ORDER — TERBUTALINE SULFATE 1 MG/ML IJ SOLN
0.2500 mg | Freq: Once | INTRAMUSCULAR | Status: DC | PRN
  Filled 2014-08-23: qty 1

## 2014-08-23 MED ORDER — SIMETHICONE 80 MG PO CHEW: 80.0000 mg | CHEWABLE_TABLET | ORAL | Status: DC | PRN

## 2014-08-23 MED ORDER — ONDANSETRON HCL 4 MG/2ML IJ SOLN: 4.0000 mg | INTRAMUSCULAR | Status: DC | PRN

## 2014-08-23 NOTE — Anesthesia Preprocedure Evaluation (Signed)

## 2014-08-23 NOTE — Anesthesia Procedure Notes (Signed)
Epidural Patient location during procedure: OB Start time: 08/23/2014 9:03 AM  Staffing Anesthesiologist: Brayton CavesJACKSON, Jamarious Febo Performed by: anesthesiologist   Preanesthetic Checklist Completed: patient identified, site marked, surgical consent, pre-op evaluation, timeout performed, IV checked, risks and benefits discussed and monitors and equipment checked  Epidural Patient position: sitting Prep: site prepped and draped and DuraPrep Patient monitoring: continuous pulse ox and blood pressure Approach: midline Location: L3-L4 Injection technique: LOR air  Needle:  Needle type: Tuohy  Needle gauge: 17 G Needle length: 9 cm and 9 Needle insertion depth: 5 cm cm Catheter type: closed end flexible Catheter size: 19 Gauge Catheter at skin depth: 10 cm Test dose: negative  Assessment Events: blood not aspirated, injection not painful, no injection resistance, negative IV test and no paresthesia  Additional Notes Patient identified.  Risk benefits discussed including failed block, incomplete pain control, headache, nerve damage, paralysis, blood pressure changes, nausea, vomiting, reactions to medication both toxic or allergic, and postpartum back pain.  Patient expressed understanding and wished to proceed.  All questions were answered.  Sterile technique used throughout procedure and epidural site dressed with sterile barrier dressing. No paresthesia or other complications noted.The patient did not experience any signs of intravascular injection such as tinnitus or metallic taste in mouth nor signs of intrathecal spread such as rapid motor block. Please see nursing notes for vital signs.

## 2014-08-23 NOTE — Progress Notes (Signed)
Patient ID: Amy Mclean, female   DOB: [birth year], 24 y.o.   MRN: [id number] Pt presents in labor SVE 4.5/80/-2, BBOW  Will get epidural and proceed with labor, pitocin prn H&P reviewed, no changes

## 2014-08-24 LAB — CBC
HCT: 27.7 % — ABNORMAL LOW (ref 36.0–46.0)
HEMOGLOBIN: 8.9 g/dL — AB (ref 12.0–15.0)
MCH: 28.3 pg (ref 26.0–34.0)
MCHC: 32.1 g/dL (ref 30.0–36.0)
MCV: 87.9 fL (ref 78.0–100.0)
PLATELETS: 186 10*3/uL (ref 150–400)
RBC: 3.15 MIL/uL — ABNORMAL LOW (ref 3.87–5.11)
RDW: 16.5 % — ABNORMAL HIGH (ref 11.5–15.5)
WBC: 9 10*3/uL (ref 4.0–10.5)

## 2014-08-24 LAB — HIV ANTIBODY (ROUTINE TESTING W REFLEX): HIV SCREEN 4TH GENERATION: NONREACTIVE

## 2014-08-24 NOTE — Progress Notes (Signed)
Post Partum Day 1 Subjective: no complaints, up ad lib, voiding, tolerating PO and nl lochia, pain controlled  Objective: Blood pressure 135/82, pulse 60, temperature 98.2 F (36.8 C), temperature source Oral, resp. rate 20, height 5\' 7"  (1.702 m), weight 155 lb (70.308 kg), last menstrual period 11/12/2013, SpO2 100 %, unknown if currently breastfeeding.  Physical Exam:  General: alert and no distress Lochia: appropriate Uterine Fundus: firm   Recent Labs  08/23/14 0743 08/24/14 0535  HGB 10.3* 8.9*  HCT 32.9* 27.7*    Assessment/Plan: Discharge home either today or tomorrow.  D/c with motrin, percocet ane prenatal vitamins   LOS: 1 day   Bovard-Stuckert, Amy Mclean 08/24/2014, 8:18 AM

## 2014-08-24 NOTE — Progress Notes (Signed)
UR chart review completed.  

## 2014-08-24 NOTE — Anesthesia Postprocedure Evaluation (Signed)
  Anesthesia Post-op Note  Patient: Amy Mclean  Procedure(s) Performed: * No procedures listed *  Patient Location: Mother/Baby  Anesthesia Type:Epidural  Level of Consciousness: awake  Airway and Oxygen Therapy: Patient Spontanous Breathing  Post-op Pain: mild  Post-op Assessment: Patient's Cardiovascular Status Stable and Respiratory Function Stable  Post-op Vital Signs: stable  Last Vitals:  Filed Vitals:   08/24/14 0600  BP: 135/82  Pulse: 60  Temp: 36.8 C  Resp: 20    Complications: No apparent anesthesia complications

## 2014-08-25 MED ORDER — BISACODYL 10 MG RE SUPP: 10.0000 mg | Freq: Once | RECTAL | Status: DC

## 2014-08-25 MED ORDER — OXYCODONE-ACETAMINOPHEN 5-325 MG PO TABS: 1.0000 | ORAL_TABLET | Freq: Four times a day (QID) | ORAL | Status: DC | PRN

## 2014-08-25 MED ORDER — IBUPROFEN 800 MG PO TABS: 800.0000 mg | ORAL_TABLET | Freq: Three times a day (TID) | ORAL | Status: DC | PRN

## 2014-08-25 MED ORDER — PRENATAL MULTIVITAMIN CH: 1.0000 | ORAL_TABLET | Freq: Every day | ORAL | Status: DC

## 2014-08-25 NOTE — Progress Notes (Signed)
Post Partum Day 2 Subjective: no complaints, up ad lib, tolerating PO and nl lochia, pain controlled  Objective: Blood pressure 107/68, pulse 70, temperature 98.2 F (36.8 C), temperature source Oral, resp. rate 18, height 5\' 7"  (1.702 m), weight 70.308 kg (155 lb), last menstrual period 11/12/2013, SpO2 100 %, unknown if currently breastfeeding.  Physical Exam:  General: alert and no distress Lochia: appropriate Uterine Fundus: firm   Recent Labs  08/23/14 0743 08/24/14 0535  HGB 10.3* 8.9*  HCT 32.9* 27.7*    Assessment/Plan: Discharge home,  Routine care.  F/u 6 weeks.  D/C with Motrin, percocet and PNV   LOS: 2 days   Bovard-Stuckert, Kaylany Tesoriero 08/25/2014, 7:28 AM

## 2014-08-25 NOTE — Discharge Summary (Signed)
Obstetric Discharge Summary Reason for Admission: onset of labor and induction of labor Prenatal Procedures: none Intrapartum Procedures: spontaneous vaginal delivery Postpartum Procedures: none Complications-Operative and Postpartum: 1st degree perineal laceration and labial laceration HEMOGLOBIN  Date Value Ref Range Status  08/24/2014 8.9* 12.0 - 15.0 g/dL Final   HCT  Date Value Ref Range Status  08/24/2014 27.7* 36.0 - 46.0 % Final    Physical Exam:  General: alert and no distress Lochia: appropriate Uterine Fundus: firm  Discharge Diagnoses: Term Pregnancy-delivered  Discharge Information: Date: 08/25/2014 Activity: pelvic rest Diet: routine Medications: PNV, Ibuprofen and Percocet Condition: stable Instructions: refer to practice specific booklet Discharge to: home Follow-up Information    Follow up with Bovard-Stuckert, Estalee Mccandlish, MD. Schedule an appointment as soon as possible for a visit in 6 weeks.   Specialty:  Obstetrics and Gynecology   Why:  for postpartum check   Contact information:   [address] [place] [id number] [phone]       Newborn Data: Live born female  Birth Weight: 8 lb (3629 g) APGAR: 8, 9  Home with mother.  Bovard-Stuckert, Tephanie Escorcia 08/25/2014, 7:33 AM

## 2014-09-01 ENCOUNTER — Encounter (HOSPITAL_COMMUNITY): Payer: Self-pay | Admitting: *Deleted

## 2014-09-01 ENCOUNTER — Inpatient Hospital Stay (HOSPITAL_COMMUNITY)
Admission: AD | Admit: 2014-09-01 | Discharge: 2014-09-03 | DRG: 776 | Disposition: A | Discharge status: Home / Self Care | Payer: Medicaid Other | Source: Ambulatory Visit | Attending: Obstetrics and Gynecology | Admitting: Obstetrics and Gynecology

## 2014-09-01 DIAGNOSIS — O152 Eclampsia in the puerperium: Secondary | ICD-10-CM | POA: Diagnosis present

## 2014-09-01 DIAGNOSIS — O1410 Severe pre-eclampsia, unspecified trimester: Secondary | ICD-10-CM | POA: Diagnosis present

## 2014-09-01 DIAGNOSIS — Z3A00 Weeks of gestation of pregnancy not specified: Secondary | ICD-10-CM | POA: Diagnosis present

## 2014-09-01 DIAGNOSIS — O09299 Supervision of pregnancy with other poor reproductive or obstetric history, unspecified trimester: Secondary | ICD-10-CM | POA: Diagnosis present

## 2014-09-01 DIAGNOSIS — O9089 Other complications of the puerperium, not elsewhere classified: Secondary | ICD-10-CM | POA: Diagnosis present

## 2014-09-01 DIAGNOSIS — O1415 Severe pre-eclampsia, complicating the puerperium: Secondary | ICD-10-CM

## 2014-09-01 HISTORY — DX: Supervision of pregnancy with other poor reproductive or obstetric history, unspecified trimester: O09.299

## 2014-09-01 LAB — URINALYSIS, ROUTINE W REFLEX MICROSCOPIC
BILIRUBIN URINE: NEGATIVE
BILIRUBIN URINE: NEGATIVE
Glucose, UA: NEGATIVE mg/dL
Glucose, UA: NEGATIVE mg/dL
Hgb urine dipstick: NEGATIVE
KETONES UR: NEGATIVE mg/dL
Ketones, ur: NEGATIVE mg/dL
Leukocytes, UA: NEGATIVE
Nitrite: NEGATIVE
Nitrite: NEGATIVE
PH: 6 (ref 5.0–8.0)
Protein, ur: NEGATIVE mg/dL
Protein, ur: NEGATIVE mg/dL
SPECIFIC GRAVITY, URINE: 1.02 (ref 1.005–1.030)
SPECIFIC GRAVITY, URINE: 1.025 (ref 1.005–1.030)
UROBILINOGEN UA: 0.2 mg/dL (ref 0.0–1.0)
Urobilinogen, UA: 1 mg/dL (ref 0.0–1.0)
pH: 6 (ref 5.0–8.0)

## 2014-09-01 LAB — COMPREHENSIVE METABOLIC PANEL
ALT: 21 U/L (ref 0–35)
AST: 21 U/L (ref 0–37)
Albumin: 3.8 g/dL (ref 3.5–5.2)
Alkaline Phosphatase: 103 U/L (ref 39–117)
Anion gap: 8 (ref 5–15)
BUN: 13 mg/dL (ref 6–23)
CO2: 26 mmol/L (ref 19–32)
CREATININE: 0.57 mg/dL (ref 0.50–1.10)
Calcium: 9.3 mg/dL (ref 8.4–10.5)
Chloride: 106 mmol/L (ref 96–112)
GFR calc Af Amer: >90 mL/min (ref >90)
GLUCOSE: 88 mg/dL (ref 70–99)
Potassium: 4 mmol/L (ref 3.5–5.1)
Sodium: 140 mmol/L (ref 135–145)
Total Bilirubin: 0.4 mg/dL (ref 0.3–1.2)
Total Protein: 7.8 g/dL (ref 6.0–8.3)

## 2014-09-01 LAB — URINE MICROSCOPIC-ADD ON

## 2014-09-01 LAB — CBC
HCT: 38.2 % (ref 36.0–46.0)
Hemoglobin: 11.9 g/dL — ABNORMAL LOW (ref 12.0–15.0)
MCH: 27.9 pg (ref 26.0–34.0)
MCHC: 31.2 g/dL (ref 30.0–36.0)
MCV: 89.5 fL (ref 78.0–100.0)
PLATELETS: 343 10*3/uL (ref 150–400)
RBC: 4.27 MIL/uL (ref 3.87–5.11)
RDW: 16 % — AB (ref 11.5–15.5)
WBC: 6.2 10*3/uL (ref 4.0–10.5)

## 2014-09-01 LAB — PROTEIN / CREATININE RATIO, URINE
CREATININE, URINE: 151 mg/dL
Protein Creatinine Ratio: 0.09 (ref 0.00–0.15)
Total Protein, Urine: 14 mg/dL

## 2014-09-01 LAB — URIC ACID: Uric Acid, Serum: 4.3 mg/dL (ref 2.4–7.0)

## 2014-09-01 LAB — LACTATE DEHYDROGENASE: LDH: 180 U/L (ref 94–250)

## 2014-09-01 MED ORDER — BUTALBITAL-APAP-CAFFEINE 50-325-40 MG PO TABS
2.0000 | ORAL_TABLET | Freq: Four times a day (QID) | ORAL | Status: DC | PRN
  Administered 2014-09-01: 2 via ORAL
  Filled 2014-09-01: qty 2

## 2014-09-01 MED ORDER — LABETALOL HCL 100 MG PO TABS
100.0000 mg | ORAL_TABLET | Freq: Once | ORAL | Status: AC
  Administered 2014-09-01: 100 mg via ORAL
  Filled 2014-09-01: qty 1

## 2014-09-01 MED ORDER — MAGNESIUM SULFATE BOLUS VIA INFUSION
4.0000 g | Freq: Once | INTRAVENOUS | Status: AC
  Administered 2014-09-01: 4 g via INTRAVENOUS
  Filled 2014-09-01: qty 500

## 2014-09-01 MED ORDER — LACTATED RINGERS IV SOLN
INTRAVENOUS | Status: DC
  Administered 2014-09-01 – 2014-09-02 (×3): via INTRAVENOUS

## 2014-09-01 MED ORDER — LABETALOL HCL 5 MG/ML IV SOLN
40.0000 mg | Freq: Once | INTRAVENOUS | Status: AC
  Administered 2014-09-01: 40 mg via INTRAVENOUS
  Filled 2014-09-01: qty 8

## 2014-09-01 MED ORDER — IBUPROFEN 800 MG PO TABS
800.0000 mg | ORAL_TABLET | Freq: Three times a day (TID) | ORAL | Status: DC | PRN
  Administered 2014-09-01 – 2014-09-03 (×3): 800 mg via ORAL
  Filled 2014-09-01 (×3): qty 1

## 2014-09-01 MED ORDER — BUTALBITAL-APAP-CAFFEINE 50-325-40 MG PO TABS
2.0000 | ORAL_TABLET | Freq: Once | ORAL | Status: AC
  Administered 2014-09-01: 2 via ORAL
  Filled 2014-09-01: qty 2

## 2014-09-01 MED ORDER — NIFEDIPINE ER OSMOTIC RELEASE 30 MG PO TB24
30.0000 mg | ORAL_TABLET | Freq: Once | ORAL | Status: AC
  Administered 2014-09-01: 30 mg via ORAL
  Filled 2014-09-01: qty 1

## 2014-09-01 MED ORDER — LABETALOL HCL 5 MG/ML IV SOLN
20.0000 mg | Freq: Once | INTRAVENOUS | Status: AC
  Administered 2014-09-01: 20 mg via INTRAVENOUS
  Filled 2014-09-01: qty 4

## 2014-09-01 MED ORDER — LABETALOL HCL 5 MG/ML IV SOLN
10.0000 mg | Freq: Once | INTRAVENOUS | Status: AC
  Administered 2014-09-01: 10 mg via INTRAVENOUS
  Filled 2014-09-01: qty 4

## 2014-09-01 MED ORDER — MAGNESIUM SULFATE 40 G IN LACTATED RINGERS - SIMPLE
2.0000 g/h | INTRAVENOUS | Status: AC
  Filled 2014-09-01: qty 500

## 2014-09-01 NOTE — MAU Note (Signed)
Urine in lab 

## 2014-09-01 NOTE — MAU Provider Note (Signed)
History     CSN: [id number]  Arrival date and time: 09/01/14 1537   First Provider Initiated Contact with Patient 09/01/14 1624      Chief Complaint  Patient presents with  . Headache  . Back Pain   HPI   [name] is a 24 y.o. female [phone] who presents to MAU with complaints of HA since she left the hospital following a vaginal delivery with epidural. She is status post vaginal delivery on 08/23/2014.  She has been taking ibuprofen every day with some relief. No Hx of HTN before or during pregnancy.   She is not breast feeding.  Position change does not make much of a difference on her HA pain.   OB History    Gravida Para Term Preterm AB TAB SAB Ectopic Multiple Living   3 3 3  0 0 0 0 0 0 3      Past Medical History  Diagnosis Date  . No pertinent past medical history   . Urinary tract infection   . Chlamydia   . Anemia   . Hx of varicella   . SVD (spontaneous vaginal delivery) 08/23/2014    Past Surgical History  Procedure Laterality Date  . No past surgeries      Family History  Problem Relation Age of Onset  . Alcohol abuse Neg Hx   . Arthritis Neg Hx   . Birth defects Neg Hx   . Asthma Neg Hx   . Cancer Neg Hx   . COPD Neg Hx   . Depression Neg Hx   . Diabetes Neg Hx   . Drug abuse Neg Hx   . Early death Neg Hx   . Hearing loss Neg Hx   . Heart disease Neg Hx   . Hyperlipidemia Neg Hx   . Kidney disease Neg Hx   . Learning disabilities Neg Hx   . Mental illness Neg Hx   . Mental retardation Neg Hx   . Miscarriages / Stillbirths Neg Hx   . Vision loss Neg Hx   . Stroke Neg Hx   . Hypertension Father   . Hypertension Maternal Grandmother     History  Substance Use Topics  . Smoking status: Never Smoker   . Smokeless tobacco: Never Used  . Alcohol Use: No    Allergies: No Known Allergies  Prescriptions prior to admission  Medication Sig Dispense Refill Last Dose  . acetaminophen (TYLENOL) 500 MG tablet Take 1,000 mg by mouth  every 6 (six) hours as needed for mild pain.   Past Month at Unknown time  . docusate sodium (COLACE) 100 MG capsule Take 100 mg by mouth daily as needed for mild constipation.   Past Week at Unknown time  . ibuprofen (ADVIL,MOTRIN) 800 MG tablet Take 1 tablet (800 mg total) by mouth every 8 (eight) hours as needed. (Patient taking differently: Take 800 mg by mouth every 8 (eight) hours as needed for headache or cramping. ) 45 tablet 1 08/31/2014 at Unknown time  . IRON PO Take 1 tablet by mouth daily.   Past Month at Unknown time  . oxyCODONE-acetaminophen (PERCOCET/ROXICET) 5-325 MG per tablet Take 1-2 tablets by mouth every 6 (six) hours as needed. (Patient taking differently: Take 1-2 tablets by mouth every 6 (six) hours as needed for severe pain. ) 15 tablet 0 Past Month at Unknown time  . Prenatal Vit-Fe Fumarate-FA (PRENATAL MULTIVITAMIN) TABS tablet Take 1 tablet by mouth daily at 12 noon. 30 tablet 12  Past Week at Unknown time   Results for orders placed or performed during the hospital encounter of 09/01/14 (from the past 48 hour(s))  Urinalysis, Routine w reflex microscopic     Status: Abnormal   Collection Time: 09/01/14  3:45 PM  Result Value Ref Range   Color, Urine YELLOW YELLOW   APPearance CLEAR CLEAR   Specific Gravity, Urine 1.020 1.005 - 1.030   pH 6.0 5.0 - 8.0   Glucose, UA NEGATIVE NEGATIVE mg/dL   Hgb urine dipstick LARGE (A) NEGATIVE   Bilirubin Urine NEGATIVE NEGATIVE   Ketones, ur NEGATIVE NEGATIVE mg/dL   Protein, ur NEGATIVE NEGATIVE mg/dL   Urobilinogen, UA 0.2 0.0 - 1.0 mg/dL   Nitrite NEGATIVE NEGATIVE   Leukocytes, UA MODERATE (A) NEGATIVE  Urine microscopic-add on     Status: None   Collection Time: 09/01/14  3:45 PM  Result Value Ref Range   Squamous Epithelial / LPF RARE RARE   WBC, UA 7-10 <3 WBC/hpf   RBC / HPF 3-6 <3 RBC/hpf   Bacteria, UA RARE RARE   Urine-Other MUCOUS PRESENT   Protein / creatinine ratio, urine     Status: None   Collection  Time: 09/01/14  3:45 PM  Result Value Ref Range   Creatinine, Urine 151.00 mg/dL   Total Protein, Urine 14 mg/dL    Comment: NO NORMAL RANGE ESTABLISHED FOR THIS TEST   Protein Creatinine Ratio 0.09 0.00 - 0.15  Urinalysis, Routine w reflex microscopic     Status: None   Collection Time: 09/01/14  4:38 PM  Result Value Ref Range   Color, Urine YELLOW YELLOW   APPearance CLEAR CLEAR   Specific Gravity, Urine 1.025 1.005 - 1.030   pH 6.0 5.0 - 8.0   Glucose, UA NEGATIVE NEGATIVE mg/dL   Hgb urine dipstick NEGATIVE NEGATIVE   Bilirubin Urine NEGATIVE NEGATIVE   Ketones, ur NEGATIVE NEGATIVE mg/dL   Protein, ur NEGATIVE NEGATIVE mg/dL   Urobilinogen, UA 1.0 0.0 - 1.0 mg/dL   Nitrite NEGATIVE NEGATIVE   Leukocytes, UA NEGATIVE NEGATIVE    Comment: MICROSCOPIC NOT DONE ON URINES WITH NEGATIVE PROTEIN, BLOOD, LEUKOCYTES, NITRITE, OR GLUCOSE <1000 mg/dL.  CBC     Status: Abnormal   Collection Time: 09/01/14  4:52 PM  Result Value Ref Range   WBC 6.2 4.0 - 10.5 K/uL   RBC 4.27 3.87 - 5.11 MIL/uL   Hemoglobin 11.9 (L) 12.0 - 15.0 g/dL   HCT 38.2 36.0 - 46.0 %   MCV 89.5 78.0 - 100.0 fL   MCH 27.9 26.0 - 34.0 pg   MCHC 31.2 30.0 - 36.0 g/dL   RDW 16.0 (H) 11.5 - 15.5 %   Platelets 343 150 - 400 K/uL  Comprehensive metabolic panel     Status: None   Collection Time: 09/01/14  4:52 PM  Result Value Ref Range   Sodium 140 135 - 145 mmol/L   Potassium 4.0 3.5 - 5.1 mmol/L   Chloride 106 96 - 112 mmol/L   CO2 26 19 - 32 mmol/L   Glucose, Bld 88 70 - 99 mg/dL   BUN 13 6 - 23 mg/dL   Creatinine, Ser 0.57 0.50 - 1.10 mg/dL   Calcium 9.3 8.4 - 10.5 mg/dL   Total Protein 7.8 6.0 - 8.3 g/dL   Albumin 3.8 3.5 - 5.2 g/dL   AST 21 0 - 37 U/L   ALT 21 0 - 35 U/L   Alkaline Phosphatase 103 39 - 117 U/L  Total Bilirubin 0.4 0.3 - 1.2 mg/dL   GFR calc non Af Amer >90 >90 mL/min   GFR calc Af Amer >90 >90 mL/min    Comment: (NOTE) The eGFR has been calculated using the CKD EPI  equation. This calculation has not been validated in all clinical situations. eGFR's persistently <90 mL/min signify possible Chronic Kidney Disease.    Anion gap 8 5 - 15  Uric acid     Status: None   Collection Time: 09/01/14  4:52 PM  Result Value Ref Range   Uric Acid, Serum 4.3 2.4 - 7.0 mg/dL  Lactate dehydrogenase     Status: None   Collection Time: 09/01/14  4:52 PM  Result Value Ref Range   LDH 180 94 - 250 U/L    Review of Systems  Constitutional: Negative for fever and chills.  Eyes: Negative for blurred vision.  Respiratory: Negative for shortness of breath.   Cardiovascular: Negative for chest pain.  Musculoskeletal: Positive for back pain (Bilateral lower back pain ).  Neurological: Positive for headaches.   Physical Exam   Blood pressure 159/111, pulse 79, resp. rate 18, last menstrual period 11/12/2013, unknown if currently breastfeeding.    Patient Vitals for the past 24 hrs:  BP Temp Temp src Pulse Resp  09/01/14 2003 154/98 mmHg - - 70 -  09/01/14 2001 (!) 161/102 mmHg - - 69 -  09/01/14 1944 (!) 166/104 mmHg - - 69 -  09/01/14 1930 (!) 166/108 mmHg 98.1 F (36.7 C) Oral 71 -  09/01/14 1921 (!) 159/111 mmHg - - 79 -  09/01/14 1859 (!) 159/106 mmHg - - 74 18  09/01/14 1837 (!) 151/106 mmHg - - 84 -  09/01/14 1802 144/99 mmHg - - - -  09/01/14 1800 (!) 163/108 mmHg - - - -  09/01/14 1757 (!) 148/105 mmHg - - - -  09/01/14 1745 125/95 mmHg - - - -  09/01/14 1730 130/81 mmHg - - - -  09/01/14 1715 127/93 mmHg - - - -  09/01/14 1701 130/93 mmHg - - 99 -  09/01/14 1640 134/100 mmHg - - 89 -  09/01/14 1631 127/96 mmHg - - 105 -  09/01/14 1610 131/96 mmHg - - 104 -  09/01/14 1600 118/81 mmHg - - 120 -  09/01/14 1556 120/89 mmHg - - (!) 136 -  09/01/14 1554 126/93 mmHg - - (!) 130 -  09/01/14 1454 - - - (!) 130 -      Physical Exam  Constitutional: She is oriented to person, place, and time. She appears well-developed and well-nourished. No  distress.  HENT:  Head: Normocephalic.  Eyes: Pupils are equal, round, and reactive to light.  Neck: Neck supple.  Cardiovascular: Tachycardia present.   Respiratory: Effort normal.  GI: Soft. She exhibits no distension. There is no tenderness. There is no rebound and no CVA tenderness.  Musculoskeletal: Normal range of motion.  Neurological: She is alert and oriented to person, place, and time. She has normal reflexes.  Negative clonus   Skin: Skin is warm. She is not diaphoretic.  Psychiatric: Her behavior is normal.    MAU Course  Procedures  None  MDM UA CBC CMET Uric Acid  LDH Protein creatinine ratio   Fioricet 2 tabs given Some improvement of HA   Discussed patient with Dr. Ulanda Edison  BP not improved 100 mg PO labetalol given once Discussed BP readings with Dr. Ulanda Edison  IV started; LR Labetalol 10 mg IV given  1945: Dr. Ulanda Edison to MAU to see the patient.   Report given to V. Tamala Julian CNM who resumes care of the patient   Labetalol 20 mg IV ordered   [place], CNM assumed care of at pt at 2000.   2010: Labetalol 20 mg given. BP's 142-170/98-108 following dose. Labetalol 40 mg ordered  2058: Labetalol 40 mg given. BP's 155-164/95-99 following dose. HA 2/10.   2128: Dr. Ulanda Edison paged. Will discuss management w/ MFM. HA worsened. Repeat Fioricet.   Assessment and Plan   1. Pre-eclampsia, severe, postpartum condition    PLAN: Admit to AICU for Mag Sulfate per consult w/ MFM  Lezlie Lye, NP 09/01/2014 7:48 PM  Manya Silvas, [place] 09/01/2014 11:48 PM

## 2014-09-01 NOTE — MAU Note (Signed)
C/o daily headache and Ibuprofen helps for a little while; but headaches keep coming back; delivered vaginally 9 days ago;c/o lower backache with some numbness and tingling- back ache is improving with time;

## 2014-09-02 LAB — MRSA PCR SCREENING: MRSA BY PCR: NEGATIVE

## 2014-09-02 MED ORDER — LABETALOL HCL 100 MG PO TABS
100.0000 mg | ORAL_TABLET | Freq: Two times a day (BID) | ORAL | Status: DC
  Administered 2014-09-02 – 2014-09-03 (×2): 100 mg via ORAL
  Filled 2014-09-02 (×4): qty 1

## 2014-09-02 MED ORDER — NIFEDIPINE ER 30 MG PO TB24
30.0000 mg | ORAL_TABLET | Freq: Every day | ORAL | Status: DC
  Administered 2014-09-02: 30 mg via ORAL
  Filled 2014-09-02 (×3): qty 1

## 2014-09-02 NOTE — Progress Notes (Signed)
Patient ID: Amy Mclean, female   DOB: Aug 01, 1990, 24 y.o.   MRN: [id number]  Pt feeling well.  No more HA  AF 114-140/75-105 (134/96) gen NAD  At Midnight if BP stays stable will turn off Magnesium, observe until AM On Procardia XL 30, Labetalol 100 bid Plan for discharge in AM

## 2014-09-02 NOTE — H&P (Signed)
NAMMarland Kitchen:  Amy Mclean, Amy Mclean             ACCOUNT NO.:  [id number]  MEDICAL RECORD NO.:  [id number]  LOCATION:  9374                          FACILITY:  [facility]  PHYSICIAN:  Malachi Prohomas F. Ambrose MantleHenley, M.D. DATE OF BIRTH:  1991/05/26  DATE OF ADMISSION:  09/01/2014 DATE OF DISCHARGE:                             HISTORY & PHYSICAL   PRESENT ILLNESS:  This is a 24 year old black female, para 3-0-0-3, admitted for hypertension and headache.  This patient was admitted to the hospital on August 22, 2014.  At that time, she was admitted for induction of labor.  She underwent induction of labor and delivered a healthy infant.  She went home and began having a headache as soon as she went home.  The headache is described as being on the top of her head and in the occipital area.  She took ibuprofen with minimal relief, came to the Maternity Admission Unit today, was evaluated with labs, cath urine, and blood, and no evidence of any laboratory disturbance related to preeclampsia, specifically no proteinuria and normal SGOT and SGPT.  She was given labetalol 100 mg p.o. without effect and she was given 10, 20, and 40 mg of labetalol IV without any significant effect. I [facility]  He suggested admitting the patient and placing her on magnesium sulfate.  PAST MEDICAL HISTORY:  Revealed a history of UTIs, chlamydia, anemia, and varicella.  PAST SURGICAL HISTORY:  She had no surgical history.  FAMILY HISTORY:  Hypertension in her father and grandmother.  SOCIAL HISTORY:  The patient reported that she had never smoked.  She has never used smokeless tobacco.  She does not drink alcohol or use illicit drugs.  She is single and unemployed.  PHYSICAL EXAMINATION:  HEART AND LUNGS:  Normal heart and lungs. ABDOMEN:  Soft, no longer gravid. PELVIC:  Not done. VITAL SIGNS: Temperature 98.7, pulse 67, respirations 18, last blood pressure is 153/99, on magnesium  sulfate.  ASSESSMENT:  In the Maternity Admission Unit, she was observed for 6 or 8 hours.  Initially, her blood pressure was essentially normal at 120/89, 118/81, 131/96, 134/100, 130/93, 127/93, 125/95, 148/105, 163/108, 144/99, 151/106, 159/106, 159/111, 166/108, 166/104, and 161/102.  Her blood pressures remained in that range in spite of the IV and p.o. labetalol.  She was given a dose of Procardia 30 mg XL and then I consulted with Dr. Harlon FlorWhitaker and he advised bringing her into the hospital.  ADMITTING IMPRESSION:  Nine days' postpartum with hypertension and headache.  The patient has no laboratory evidence of preeclampsia.  She was admitted for magnesium sulfate.  We will continue the Procardia and had labetalol as necessary.     Malachi Prohomas F. Ambrose MantleHenley, M.D.     TFH/MEDQ  D:  09/01/2014  T:  09/02/2014  Job:  [id number]

## 2014-09-02 NOTE — Progress Notes (Signed)
UR chart review completed.  

## 2014-09-02 NOTE — Progress Notes (Signed)
[id number] - Patient's heart rate 120-140s.  Patient asymptomatic.  States that she "feels nervous."  States that she "feels this way at home sometimes."  Denies dizziness, pain and shortness of breath.  Dr. Ellyn HackBovard notified.  No new orders at this time.

## 2014-09-02 NOTE — Progress Notes (Signed)
Patient ID: Amy Mclean, female   DOB: Jan 14, 1991, 24 y.o.   MRN: [id number] BP is improved but has tachycardia. Will get EKG. Pt states her headache is gone.

## 2014-09-03 MED ORDER — LABETALOL HCL 100 MG PO TABS: 100.0000 mg | ORAL_TABLET | Freq: Two times a day (BID) | ORAL | Status: DC

## 2014-09-03 NOTE — Progress Notes (Signed)
Patient's IV removed.  Site WNL.  AVS reviewed with patient.  Verbalized understanding of discharge instructions, physician follow-up, and medications.  Teach back method used.  Reports all belongings intact and in possession at time of discharge.  Patient escorted by NT to main entrance for discharge.  Patient stable at time of discharge.

## 2014-09-03 NOTE — Progress Notes (Addendum)
Post Partum Day 10 - readmitted with severe HA - PreEclampsia Subjective: no complaints, up ad lib, voiding, tolerating PO and feeling much better.    Objective: Blood pressure 116/81, pulse 92, temperature 98.4 F (36.9 C), temperature source Oral, resp. rate 16, height 5\' 7"  (1.702 m), weight 61.735 kg (136 lb 1.6 oz), last menstrual period 11/12/2013, SpO2 98 %, unknown if currently breastfeeding.  Physical Exam:  General: alert and no distress Lochia: appropriate Uterine Fundus: firm   Recent Labs  09/01/14 1652  HGB 11.9*  HCT 38.2    Assessment/Plan: Discharge home.  D/C with labetalol 100 bid.  F/u for BP check next week, check at home.  Call with questions or problems   LOS: 2 days   Bovard-Stuckert, Conan Mcmanaway 09/03/2014, 7:49 AM

## 2014-09-03 NOTE — Discharge Summary (Signed)
Obstetric Discharge Summary Reason for Admission: severe HA, elevated BP - severe PreE Prenatal Procedures: N/A Intrapartum Procedures: N/A Postpartum Procedures: PP Magnesium Complications-Operative and Postpartum: readmission for PP PreE HEMOGLOBIN  Date Value Ref Range Status  09/01/2014 11.9* 12.0 - 15.0 g/dL Final   HCT  Date Value Ref Range Status  09/01/2014 38.2 36.0 - 46.0 % Final    Physical Exam:  General: alert and no distress Lochia: appropriate Uterine Fundus: firm  Discharge Diagnoses: PP Pre E  Discharge Information: Date: 09/03/2014 Activity: pelvic rest Diet: routine Medications: PNV, Ibuprofen, Percocet and Labetalol 100 bid Condition: stable Instructions: refer to practice specific booklet Discharge to: home Follow-up Information    Follow up with Amy Mclean, Tenisha Fleece, MD. Schedule an appointment as soon as possible for a visit in 1 week.   Specialty:  Obstetrics and Gynecology   Why:  for Blood Pressure Check, 6 wk after delivery for postpartum   Contact information:   [address] [place] [id number] [phone]       Newborn Data: Live born female  Birth Weight: 8 lb (3629 g) APGAR: 8, 9  Delivered 08/23/14  Amy Mclean, Amy Mclean 09/03/2014, 8:35 AM

## 2014-10-05 ENCOUNTER — Emergency Department (HOSPITAL_COMMUNITY)
Admission: EM | Admit: 2014-10-05 | Discharge: 2014-10-05 | Disposition: A | Discharge status: Home / Self Care | Payer: Medicaid Other | Attending: Emergency Medicine | Admitting: Emergency Medicine

## 2014-10-05 ENCOUNTER — Encounter (HOSPITAL_COMMUNITY): Payer: Self-pay | Admitting: Neurology

## 2014-10-05 DIAGNOSIS — Z79899 Other long term (current) drug therapy: Secondary | ICD-10-CM | POA: Insufficient documentation

## 2014-10-05 DIAGNOSIS — Z8619 Personal history of other infectious and parasitic diseases: Secondary | ICD-10-CM | POA: Insufficient documentation

## 2014-10-05 DIAGNOSIS — Z87440 Personal history of urinary (tract) infections: Secondary | ICD-10-CM | POA: Diagnosis not present

## 2014-10-05 DIAGNOSIS — R5383 Other fatigue: Secondary | ICD-10-CM | POA: Diagnosis not present

## 2014-10-05 DIAGNOSIS — D649 Anemia, unspecified: Secondary | ICD-10-CM | POA: Diagnosis not present

## 2014-10-05 DIAGNOSIS — R42 Dizziness and giddiness: Secondary | ICD-10-CM | POA: Insufficient documentation

## 2014-10-05 DIAGNOSIS — R51 Headache: Secondary | ICD-10-CM | POA: Diagnosis not present

## 2014-10-05 DIAGNOSIS — R55 Syncope and collapse: Secondary | ICD-10-CM | POA: Diagnosis present

## 2014-10-05 DIAGNOSIS — N939 Abnormal uterine and vaginal bleeding, unspecified: Secondary | ICD-10-CM | POA: Diagnosis not present

## 2014-10-05 LAB — CBC
HCT: 34.8 % — ABNORMAL LOW (ref 36.0–46.0)
HEMOGLOBIN: 10.8 g/dL — AB (ref 12.0–15.0)
MCH: 27.9 pg (ref 26.0–34.0)
MCHC: 31 g/dL (ref 30.0–36.0)
MCV: 89.9 fL (ref 78.0–100.0)
Platelets: 173 10*3/uL (ref 150–400)
RBC: 3.87 MIL/uL (ref 3.87–5.11)
RDW: 15.2 % (ref 11.5–15.5)
WBC: 4 10*3/uL (ref 4.0–10.5)

## 2014-10-05 LAB — BASIC METABOLIC PANEL
Anion gap: 10 (ref 5–15)
BUN: 9 mg/dL (ref 6–23)
CHLORIDE: 105 mmol/L (ref 96–112)
CO2: 23 mmol/L (ref 19–32)
Calcium: 9.6 mg/dL (ref 8.4–10.5)
Creatinine, Ser: 0.62 mg/dL (ref 0.50–1.10)
GFR calc Af Amer: >90 mL/min (ref >90)
GFR calc non Af Amer: >90 mL/min (ref >90)
Glucose, Bld: 82 mg/dL (ref 70–99)
POTASSIUM: 3.4 mmol/L — AB (ref 3.5–5.1)
Sodium: 138 mmol/L (ref 135–145)

## 2014-10-05 MED ORDER — POTASSIUM CHLORIDE CRYS ER 20 MEQ PO TBCR
40.0000 meq | EXTENDED_RELEASE_TABLET | Freq: Once | ORAL | Status: AC
  Administered 2014-10-05: 40 meq via ORAL
  Filled 2014-10-05: qty 2

## 2014-10-05 NOTE — ED Notes (Signed)
Pt reports this morning was at home felt dizzy and woozy and feeling sob. The episode pasted but she called EMS. Pt denies complaints at current. Is 1 month post partum. Pt is a x 4

## 2014-10-05 NOTE — ED Provider Notes (Signed)
CSN: [id number]     Arrival date & time 10/05/14  1228 History   First MD Initiated Contact with Patient 10/05/14 1501     Chief Complaint  Patient presents with  . Near Syncope     (Consider location/radiation/quality/duration/timing/severity/associated sxs/prior Treatment) Patient is a 24 y.o. female presenting with near-syncope. The history is provided by the patient. No language interpreter was used.  Near Syncope This is a recurrent problem. The current episode started 1 to 4 weeks ago. The problem occurs daily. The problem has been unchanged. Associated symptoms include fatigue and headaches. Pertinent negatives include no abdominal pain, change in bowel habit, chest pain, chills, congestion, coughing, diaphoresis, fever, nausea, numbness, rash, vertigo, visual change, vomiting or weakness. Nothing aggravates the symptoms. She has tried nothing for the symptoms.    Past Medical History  Diagnosis Date  . No pertinent past medical history   . Urinary tract infection   . Chlamydia   . Anemia   . Hx of varicella   . SVD (spontaneous vaginal delivery) 08/23/2014   Past Surgical History  Procedure Laterality Date  . No past surgeries     Family History  Problem Relation Age of Onset  . Alcohol abuse Neg Hx   . Arthritis Neg Hx   . Birth defects Neg Hx   . Asthma Neg Hx   . Cancer Neg Hx   . COPD Neg Hx   . Depression Neg Hx   . Diabetes Neg Hx   . Drug abuse Neg Hx   . Early death Neg Hx   . Hearing loss Neg Hx   . Heart disease Neg Hx   . Hyperlipidemia Neg Hx   . Kidney disease Neg Hx   . Learning disabilities Neg Hx   . Mental illness Neg Hx   . Mental retardation Neg Hx   . Miscarriages / Stillbirths Neg Hx   . Vision loss Neg Hx   . Stroke Neg Hx   . Hypertension Father   . Hypertension Maternal Grandmother    History  Substance Use Topics  . Smoking status: Never Smoker   . Smokeless tobacco: Never Used  . Alcohol Use: No   OB History    Gravida Para  Term Preterm AB TAB SAB Ectopic Multiple Living   0 0 0 0 0 0 3     Review of Systems  Constitutional: Positive for fatigue. Negative for fever, chills and diaphoresis.  HENT: Negative for congestion.   Eyes: Negative for photophobia and visual disturbance.  Respiratory: Negative for cough, chest tightness, shortness of breath and wheezing.   Cardiovascular: Positive for near-syncope. Negative for chest pain and palpitations.  Gastrointestinal: Negative for nausea, vomiting, abdominal pain and change in bowel habit.  Genitourinary: Positive for vaginal bleeding.  Skin: Negative for rash.  Neurological: Positive for headaches. Negative for vertigo, weakness, light-headedness and numbness.  Psychiatric/Behavioral: Negative for confusion.  All other systems reviewed and are negative.     Allergies  Review of patient's allergies indicates no known allergies.  Home Medications   Prior to Admission medications   Medication Sig Start Date End Date Taking? Authorizing Provider  acetaminophen (TYLENOL) 500 MG tablet Take 1,000 mg by mouth every 6 (six) hours as needed for mild pain.    Historical Provider, MD  docusate sodium (COLACE) 100 MG capsule Take 100 mg by mouth daily as needed for mild constipation.    Historical Provider, MD  ibuprofen (ADVIL,MOTRIN) 800 MG tablet  Take 1 tablet (800 mg total) by mouth every 8 (eight) hours as needed. Patient taking differently: Take 800 mg by mouth every 8 (eight) hours as needed for headache or cramping.  08/25/14   Jody Bovard-Stuckert, MD  IRON PO Take 1 tablet by mouth daily.    Historical Provider, MD  labetalol (NORMODYNE) 100 MG tablet Take 1 tablet (100 mg total) by mouth 2 (two) times daily. 09/03/14   Sherian Rein, MD  oxyCODONE-acetaminophen (PERCOCET/ROXICET) 5-325 MG per tablet Take 1-2 tablets by mouth every 6 (six) hours as needed. Patient taking differently: Take 1-2 tablets by mouth every 6 (six) hours as needed for  severe pain.  08/25/14   Sherian Rein, MD  Prenatal Vit-Fe Fumarate-FA (PRENATAL MULTIVITAMIN) TABS tablet Take 1 tablet by mouth daily at 12 noon. 08/25/14   Jody Bovard-Stuckert, MD   BP 130/86 mmHg  Pulse 83  Temp(Src) 98.3 F (36.8 C) (Oral)  Resp 18  SpO2 100% Physical Exam  Constitutional: She is oriented to person, place, and time. Vital signs are normal. She appears well-developed and well-nourished. She does not appear ill. No distress.  HENT:  Head: Normocephalic and atraumatic.  Nose: Nose normal.  Mouth/Throat: Oropharynx is clear and moist. No oropharyngeal exudate.  Eyes: EOM are normal. Pupils are equal, round, and reactive to light.  Neck: Normal range of motion. Neck supple.  Cardiovascular: Normal rate, regular rhythm, normal heart sounds and intact distal pulses.   No murmur heard. Pulmonary/Chest: Effort normal and breath sounds normal. No respiratory distress. She has no wheezes. She exhibits no tenderness.  Abdominal: Soft. There is no tenderness. There is no rebound and no guarding.  Musculoskeletal: Normal range of motion. She exhibits no tenderness.  Lymphadenopathy:    She has no cervical adenopathy.  Neurological: She is alert and oriented to person, place, and time. She has normal strength. No cranial nerve deficit or sensory deficit. Coordination normal.  Skin: Skin is warm and dry. She is not diaphoretic.  Psychiatric: She has a normal mood and affect. Her behavior is normal. Judgment and thought content normal.  Nursing note and vitals reviewed.   ED Course  Procedures (including critical care time) Labs Review Labs Reviewed  CBC - Abnormal; Notable for the following:    Hemoglobin 10.8 (*)    HCT 34.8 (*)    All other components within normal limits  BASIC METABOLIC PANEL - Abnormal; Notable for the following:    Potassium 3.4 (*)    All other components within normal limits    Imaging Review No results found.   EKG  Interpretation   Date/Time:  Tuesday October 05 2014 13:12:49 EDT Ventricular Rate:  86 PR Interval:  170 QRS Duration: 84 QT Interval:  392 QTC Calculation: 469 R Axis:   67 Text Interpretation:  Normal sinus rhythm Nonspecific T wave abnormality  Prolonged QT Abnormal ECG When compared with ECG of 09/02/2014, HEART RATE  has decreased Confirmed by Preston Fleeting  MD, DAVID ([id number]) on 10/05/2014 4:03:20  PM      MDM   Final diagnoses:  Lightheadedness   Pt is a 24 yo G3P3 who is 6 weeks postpartum from a SVD who presents with lightheadedness and SOB.  She was diagnosed with HTN after delivery but had no lab changes consistent with pre-E.  She was started on labetalol and has been taking this since.  Patient reports that for the past 6 weeks she has had recurrent episodes of lightheadedness, worse with rapid position changes  and standing.  She has never had a full syncopal episode or any falls.  She endorses mild SOB during these periods of lightheadedness, which occasionally continues for several minutes after the lightheadedness resolves.  She denies chest pain, cough, N/V/D, or fevers.  Patient is not breast feeding.  Has started small amount of vaginal bleeding again 2 days ago.  Not passing significant amounts.  She endorses fatigue associated with the new baby but reports she has lots of support.  No hx of thyroid problems, arrhythmias, or blood clotting in the past.    Patient is well appearing, vitals normal.  Benign exam.  Nonfocal neuro exam.    Labs reassuring.  No anemia, normal electrolytes.  Her position change associated lightheadedness could be associated with her new BP meds affecting orthostatics.  Doubt infection.  Doubt PE.  No evidence of anemia.   She was given instructions on good PO fluid intake and to change positions slowly.  Advised that if her sx continue for a prolonged time that she should follow up to ensure that her sx are not associated with thyroid pathology.  She  ha no other evidence of hypothyroidism, including no constipation, skin changes, or foggy mentation. She was given red flag sx to watch out for and voiced understanding.  Encouraged to follow up with PCP after discharge.    Patient was seen with ED Attending, Dr. Sharl MaGlick  Mane Consolo, MD   Lenell AntuJamie Charmine Bockrath, MD 10/06/14 [id number]  Dione Boozeavid Glick, MD 10/10/14 [phone]
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
## 2015-04-20 ENCOUNTER — Encounter (HOSPITAL_COMMUNITY): Payer: Self-pay | Admitting: Emergency Medicine

## 2015-04-20 ENCOUNTER — Emergency Department (HOSPITAL_COMMUNITY)
Admission: EM | Admit: 2015-04-20 | Discharge: 2015-04-20 | Disposition: A | Discharge status: Home / Self Care | Payer: Medicaid Other | Attending: Emergency Medicine | Admitting: Emergency Medicine

## 2015-04-20 DIAGNOSIS — Z87440 Personal history of urinary (tract) infections: Secondary | ICD-10-CM | POA: Diagnosis not present

## 2015-04-20 DIAGNOSIS — Y92410 Unspecified street and highway as the place of occurrence of the external cause: Secondary | ICD-10-CM | POA: Insufficient documentation

## 2015-04-20 DIAGNOSIS — Z862 Personal history of diseases of the blood and blood-forming organs and certain disorders involving the immune mechanism: Secondary | ICD-10-CM | POA: Insufficient documentation

## 2015-04-20 DIAGNOSIS — Z8619 Personal history of other infectious and parasitic diseases: Secondary | ICD-10-CM | POA: Diagnosis not present

## 2015-04-20 DIAGNOSIS — Y9389 Activity, other specified: Secondary | ICD-10-CM | POA: Insufficient documentation

## 2015-04-20 DIAGNOSIS — Z79899 Other long term (current) drug therapy: Secondary | ICD-10-CM | POA: Insufficient documentation

## 2015-04-20 DIAGNOSIS — I10 Essential (primary) hypertension: Secondary | ICD-10-CM | POA: Diagnosis not present

## 2015-04-20 DIAGNOSIS — T148XXA Other injury of unspecified body region, initial encounter: Secondary | ICD-10-CM

## 2015-04-20 DIAGNOSIS — Y999 Unspecified external cause status: Secondary | ICD-10-CM | POA: Insufficient documentation

## 2015-04-20 DIAGNOSIS — S50812A Abrasion of left forearm, initial encounter: Secondary | ICD-10-CM | POA: Diagnosis not present

## 2015-04-20 DIAGNOSIS — S59912A Unspecified injury of left forearm, initial encounter: Secondary | ICD-10-CM | POA: Diagnosis present

## 2015-04-20 HISTORY — DX: Essential (primary) hypertension: I10

## 2015-04-20 MED ORDER — BACITRACIN ZINC 500 UNIT/GM EX OINT
1.0000 "application " | TOPICAL_OINTMENT | Freq: Two times a day (BID) | CUTANEOUS | Status: DC
  Administered 2015-04-20: 1 via TOPICAL
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
## 2015-06-19 DIAGNOSIS — A5901 Trichomonal vulvovaginitis: Secondary | ICD-10-CM

## 2015-06-19 HISTORY — DX: Trichomonal vulvovaginitis: A59.01

## 2015-12-30 ENCOUNTER — Inpatient Hospital Stay (HOSPITAL_COMMUNITY): Payer: Self-pay | Admitting: Anesthesiology

## 2015-12-30 ENCOUNTER — Encounter (HOSPITAL_COMMUNITY)
Admission: AD | Disposition: A | Discharge status: Home / Self Care | Payer: Self-pay | Source: Ambulatory Visit | Attending: Obstetrics and Gynecology

## 2015-12-30 ENCOUNTER — Ambulatory Visit (HOSPITAL_COMMUNITY)
Admission: AD | Admit: 2015-12-30 | Discharge: 2015-12-30 | Disposition: A | Discharge status: Home / Self Care | Payer: Self-pay | Source: Ambulatory Visit | Attending: Obstetrics and Gynecology | Admitting: Obstetrics and Gynecology

## 2015-12-30 ENCOUNTER — Inpatient Hospital Stay (HOSPITAL_COMMUNITY): Payer: Self-pay

## 2015-12-30 ENCOUNTER — Encounter (HOSPITAL_COMMUNITY): Payer: Self-pay | Admitting: *Deleted

## 2015-12-30 DIAGNOSIS — I10 Essential (primary) hypertension: Secondary | ICD-10-CM | POA: Insufficient documentation

## 2015-12-30 DIAGNOSIS — O4690 Antepartum hemorrhage, unspecified, unspecified trimester: Secondary | ICD-10-CM

## 2015-12-30 DIAGNOSIS — R1030 Lower abdominal pain, unspecified: Secondary | ICD-10-CM

## 2015-12-30 DIAGNOSIS — N9489 Other specified conditions associated with female genital organs and menstrual cycle: Secondary | ICD-10-CM

## 2015-12-30 DIAGNOSIS — O009 Unspecified ectopic pregnancy without intrauterine pregnancy: Secondary | ICD-10-CM

## 2015-12-30 DIAGNOSIS — Z3A10 10 weeks gestation of pregnancy: Secondary | ICD-10-CM | POA: Insufficient documentation

## 2015-12-30 DIAGNOSIS — O039 Complete or unspecified spontaneous abortion without complication: Secondary | ICD-10-CM | POA: Insufficient documentation

## 2015-12-30 HISTORY — DX: Other specified conditions associated with female genital organs and menstrual cycle: N94.89

## 2015-12-30 HISTORY — PX: LAPAROSCOPY: SHX197

## 2015-12-30 LAB — CBC
HCT: 31.7 % — ABNORMAL LOW (ref 36.0–46.0)
Hemoglobin: 10.4 g/dL — ABNORMAL LOW (ref 12.0–15.0)
MCH: 27.9 pg (ref 26.0–34.0)
MCHC: 32.8 g/dL (ref 30.0–36.0)
MCV: 85 fL (ref 78.0–100.0)
PLATELETS: 278 10*3/uL (ref 150–400)
RBC: 3.73 MIL/uL — ABNORMAL LOW (ref 3.87–5.11)
RDW: 15.9 % — AB (ref 11.5–15.5)
WBC: 9.3 10*3/uL (ref 4.0–10.5)

## 2015-12-30 LAB — HCG, QUANTITATIVE, PREGNANCY: HCG, BETA CHAIN, QUANT, S: 18030 m[IU]/mL — AB (ref <5)

## 2015-12-30 LAB — TYPE AND SCREEN
ABO/RH(D): O POS
ANTIBODY SCREEN: NEGATIVE

## 2015-12-30 SURGERY — LAPAROSCOPY, DIAGNOSTIC
Anesthesia: General | Site: Abdomen

## 2015-12-30 MED ORDER — FENTANYL CITRATE (PF) 100 MCG/2ML IJ SOLN
INTRAMUSCULAR | Status: DC | PRN
  Administered 2015-12-30 (×2): 100 ug via INTRAVENOUS
  Administered 2015-12-30: 50 ug via INTRAVENOUS

## 2015-12-30 MED ORDER — KETOROLAC TROMETHAMINE 30 MG/ML IJ SOLN
30.0000 mg | Freq: Once | INTRAMUSCULAR | Status: AC
  Administered 2015-12-30: 30 mg via INTRAVENOUS
  Filled 2015-12-30: qty 1

## 2015-12-30 MED ORDER — IBUPROFEN 200 MG PO TABS: 600.0000 mg | ORAL_TABLET | Freq: Four times a day (QID) | ORAL | Status: DC | PRN

## 2015-12-30 MED ORDER — LACTATED RINGERS IR SOLN
Status: DC | PRN
  Administered 2015-12-30: 3000 mL

## 2015-12-30 MED ORDER — ONDANSETRON HCL 4 MG/2ML IJ SOLN
INTRAMUSCULAR | Status: AC
  Filled 2015-12-30: qty 2

## 2015-12-30 MED ORDER — MEPERIDINE HCL 25 MG/ML IJ SOLN: 6.2500 mg | INTRAMUSCULAR | Status: DC | PRN

## 2015-12-30 MED ORDER — PROPOFOL 10 MG/ML IV BOLUS
INTRAVENOUS | Status: AC
  Filled 2015-12-30: qty 20

## 2015-12-30 MED ORDER — LIDOCAINE HCL (CARDIAC) 20 MG/ML IV SOLN
INTRAVENOUS | Status: AC
  Filled 2015-12-30: qty 5

## 2015-12-30 MED ORDER — MIDAZOLAM HCL 2 MG/2ML IJ SOLN
INTRAMUSCULAR | Status: AC
  Filled 2015-12-30: qty 2

## 2015-12-30 MED ORDER — MIDAZOLAM HCL 2 MG/2ML IJ SOLN: 0.5000 mg | Freq: Once | INTRAMUSCULAR | Status: DC | PRN

## 2015-12-30 MED ORDER — BUPIVACAINE HCL (PF) 0.25 % IJ SOLN
INTRAMUSCULAR | Status: DC | PRN
  Administered 2015-12-30: 10 mL

## 2015-12-30 MED ORDER — SCOPOLAMINE 1 MG/3DAYS TD PT72
MEDICATED_PATCH | TRANSDERMAL | Status: DC | PRN
  Administered 2015-12-30: 1 via TRANSDERMAL

## 2015-12-30 MED ORDER — FENTANYL CITRATE (PF) 250 MCG/5ML IJ SOLN
INTRAMUSCULAR | Status: AC
  Filled 2015-12-30: qty 5

## 2015-12-30 MED ORDER — ONDANSETRON HCL 4 MG/2ML IJ SOLN
INTRAMUSCULAR | Status: DC | PRN
  Administered 2015-12-30: 4 mg via INTRAVENOUS

## 2015-12-30 MED ORDER — LIDOCAINE HCL (CARDIAC) 20 MG/ML IV SOLN
INTRAVENOUS | Status: DC | PRN
  Administered 2015-12-30: 20 mg via INTRAVENOUS

## 2015-12-30 MED ORDER — SUCCINYLCHOLINE CHLORIDE 20 MG/ML IJ SOLN
INTRAMUSCULAR | Status: DC | PRN
  Administered 2015-12-30: 100 mg via INTRAVENOUS

## 2015-12-30 MED ORDER — GLYCOPYRROLATE 0.2 MG/ML IJ SOLN
INTRAMUSCULAR | Status: AC
Start: 2015-12-30 — End: 2015-12-30
  Filled 2015-12-30: qty 1

## 2015-12-30 MED ORDER — OXYCODONE-ACETAMINOPHEN 5-325 MG PO TABS: 1.0000 | ORAL_TABLET | Freq: Four times a day (QID) | ORAL | Status: DC | PRN

## 2015-12-30 MED ORDER — DEXAMETHASONE SODIUM PHOSPHATE 10 MG/ML IJ SOLN
INTRAMUSCULAR | Status: AC
  Filled 2015-12-30: qty 1

## 2015-12-30 MED ORDER — MIDAZOLAM HCL 2 MG/2ML IJ SOLN
INTRAMUSCULAR | Status: DC | PRN
Start: 2015-12-30 — End: 2015-12-30
  Administered 2015-12-30: 2 mg via INTRAVENOUS

## 2015-12-30 MED ORDER — HYDROMORPHONE HCL 1 MG/ML IJ SOLN: 0.2500 mg | INTRAMUSCULAR | Status: DC | PRN

## 2015-12-30 MED ORDER — DEXAMETHASONE SODIUM PHOSPHATE 10 MG/ML IJ SOLN
INTRAMUSCULAR | Status: DC | PRN
  Administered 2015-12-30: 10 mg via INTRAVENOUS

## 2015-12-30 MED ORDER — SUGAMMADEX SODIUM 200 MG/2ML IV SOLN
INTRAVENOUS | Status: DC | PRN
  Administered 2015-12-30: 116 mg via INTRAVENOUS

## 2015-12-30 MED ORDER — SODIUM CHLORIDE 0.9 % IV BOLUS (SEPSIS)
1000.0000 mL | Freq: Once | INTRAVENOUS | Status: AC
  Administered 2015-12-30: 1000 mL via INTRAVENOUS

## 2015-12-30 MED ORDER — PROMETHAZINE HCL 25 MG/ML IJ SOLN: 6.2500 mg | INTRAMUSCULAR | Status: DC | PRN

## 2015-12-30 MED ORDER — SCOPOLAMINE 1 MG/3DAYS TD PT72
MEDICATED_PATCH | TRANSDERMAL | Status: AC
  Filled 2015-12-30: qty 1

## 2015-12-30 MED ORDER — LACTATED RINGERS IV SOLN
INTRAVENOUS | Status: DC | PRN
  Administered 2015-12-30 (×2): via INTRAVENOUS

## 2015-12-30 MED ORDER — ROCURONIUM BROMIDE 100 MG/10ML IV SOLN
INTRAVENOUS | Status: AC
  Filled 2015-12-30: qty 1

## 2015-12-30 MED ORDER — PROPOFOL 10 MG/ML IV BOLUS
INTRAVENOUS | Status: DC | PRN
  Administered 2015-12-30: 120 mg via INTRAVENOUS

## 2015-12-30 MED ORDER — KETOROLAC TROMETHAMINE 30 MG/ML IJ SOLN
INTRAMUSCULAR | Status: DC | PRN
Start: 2015-12-30 — End: 2015-12-30
  Administered 2015-12-30: 30 mg via INTRAMUSCULAR

## 2015-12-30 MED ORDER — LACTATED RINGERS IV SOLN
INTRAVENOUS | Status: DC
  Administered 2015-12-30: 17:00:00 via INTRAVENOUS

## 2015-12-30 MED ORDER — SODIUM CHLORIDE 0.9 % IV SOLN
INTRAVENOUS | Status: DC
  Administered 2015-12-30: 13:00:00 via INTRAVENOUS

## 2015-12-30 MED ORDER — ROCURONIUM BROMIDE 100 MG/10ML IV SOLN
INTRAVENOUS | Status: DC | PRN
  Administered 2015-12-30: 20 mg via INTRAVENOUS

## 2015-12-30 SURGICAL SUPPLY — 24 items
CABLE HIGH FREQUENCY MONO STRZ (ELECTRODE) IMPLANT
CLOTH BEACON ORANGE TIMEOUT ST (SAFETY) ×3 IMPLANT
DRSG COVADERM PLUS 2X2 (GAUZE/BANDAGES/DRESSINGS) ×6 IMPLANT
DRSG OPSITE POSTOP 3X4 (GAUZE/BANDAGES/DRESSINGS) ×3 IMPLANT
DURAPREP 26ML APPLICATOR (WOUND CARE) ×3 IMPLANT
GLOVE BIO SURGEON STRL SZ 6.5 (GLOVE) ×2 IMPLANT
GLOVE BIO SURGEONS STRL SZ 6.5 (GLOVE) ×1
GLOVE BIOGEL PI IND STRL 7.0 (GLOVE) ×1 IMPLANT
GLOVE BIOGEL PI INDICATOR 7.0 (GLOVE) ×2
GOWN STRL REUS W/TWL LRG LVL3 (GOWN DISPOSABLE) ×6 IMPLANT
LIQUID BAND (GAUZE/BANDAGES/DRESSINGS) ×3 IMPLANT
NS IRRIG 1000ML POUR BTL (IV SOLUTION) ×3 IMPLANT
PACK LAPAROSCOPY BASIN (CUSTOM PROCEDURE TRAY) ×3 IMPLANT
PAD TRENDELENBURG POSITION (MISCELLANEOUS) ×3 IMPLANT
SET IRRIG TUBING LAPAROSCOPIC (IRRIGATION / IRRIGATOR) ×3 IMPLANT
SHEARS HARMONIC ACE PLUS 36CM (ENDOMECHANICALS) ×3 IMPLANT
SLEEVE XCEL OPT CAN 5 100 (ENDOMECHANICALS) ×3 IMPLANT
SUT VICRYL 0 UR6 27IN ABS (SUTURE) ×3 IMPLANT
SUT VICRYL 4-0 PS2 18IN ABS (SUTURE) ×3 IMPLANT
TOWEL OR 17X24 6PK STRL BLUE (TOWEL DISPOSABLE) ×6 IMPLANT
TRAY FOLEY CATH SILVER 14FR (SET/KITS/TRAYS/PACK) ×3 IMPLANT
TROCAR XCEL NON-BLD 11X100MML (ENDOMECHANICALS) IMPLANT
WARMER LAPAROSCOPE (MISCELLANEOUS) ×3 IMPLANT
WATER STERILE IRR 1000ML POUR (IV SOLUTION) ×3 IMPLANT

## 2015-12-30 NOTE — MAU Provider Note (Signed)
Chief Complaint: Vaginal Bleeding   First Provider Initiated Contact with Patient 12/30/15 0854     SUBJECTIVE HPI: Amy Mclean is a 25 y.o. [id number] at 3037w0d LMP who presents to [facility] by EMS reporting small amount of vaginal bleeding started last night and her vaginal bleeding and cramping since this morning. Had positive pregnancy test at her routine [facility] last month. Hasn't had any other testing this pregnancy.  Location: Suprapubic Quality: Cramping Severity: Moderate Duration: Less than 6 hours  Course: Unchanged Context: None Timing: Intermittent Modifying factors: Nothing. There are no aggravating or alleviating factors. Hasn't tried anything for the pain Associated signs and symptoms: Positive for moderate vaginal bleeding. Unsure if she's passed any tissues or clots. Negative for fever, chills, nausea, vomiting, diarrhea, constipation, dysuria, hematuria, vaginal discharge.  Past Medical History  Diagnosis Date  . No pertinent past medical history   . Urinary tract infection   . Chlamydia   . Anemia   . Hx of varicella   . SVD (spontaneous vaginal delivery) 08/23/2014  . High blood pressure    OB History  Gravida Para Term Preterm AB SAB TAB Ectopic Multiple Living  5 3 3  0 1 0 0 0 0 3    # Outcome Date GA Lbr Len/2nd Weight Sex Delivery Anes PTL Lv  5 Current           4 AB 08/2015          3 Term 08/23/14 1811w5d 05:15 / 00:34 8 lb (3.629 kg) F Vag-Spont EPI  Y  2 Term      Vag-Spont   Y  1 Term      Vag-Spont   Y     Past Surgical History  Procedure Laterality Date  . No past surgeries     Social History   Social History  . Marital Status: Single    Spouse Name: N/A  . Number of Children: N/A  . Years of Education: N/A   Occupational History  . Not on file.   Social History Main Topics  . Smoking status: Never Smoker   . Smokeless tobacco: Never Used  . Alcohol Use: No  . Drug Use: No  . Sexual Activity:  Not Currently   Other Topics Concern  . Not on file   Social History Narrative   No current facility-administered medications on file prior to encounter.   No current outpatient prescriptions on file prior to encounter.   No Known Allergies  I have reviewed the past Medical Hx, Surgical Hx, Social Hx, Allergies and Medications.   Review of Systems  Constitutional: Negative for fever and chills.  Gastrointestinal: Positive for abdominal pain. Negative for vomiting, diarrhea, constipation and abdominal distention.  Genitourinary: Positive for vaginal bleeding and pelvic pain. Negative for dysuria, hematuria and vaginal discharge.     OBJECTIVE Patient Vitals for the past 24 hrs:  BP Temp Temp src Pulse Resp SpO2  12/30/15 1129 119/85 mmHg 98 F (36.7 C) Oral 116 18 100 %  12/30/15 0807 119/77 mmHg 97.7 F (36.5 C) Oral 110 18 -   Constitutional: Well-developed, well-nourished female in no acute distress. Extremely anxious. Cardiovascular: I'll Tachycardia. Regular rhythm. No murmurs rubs or gallops. Respiratory: normal rate and effort. Clear to auscultation bilaterally GI: Abd soft, mild-moderate suprapubic tenderness. Mass palpated 2/SP. Positive guarding. Pos BS x 4. MS: Extremities nontender, no edema, normal ROM Neurologic: Alert and oriented x 4.  GU: Neg CVAT.  PELVIC  EXAM: NEFG, moderate amount of bright red blood on pad.  LAB RESULTS Results for orders placed or performed during the hospital encounter of 12/30/15 (from the past 24 hour(s))  hCG, quantitative, pregnancy     Status: Abnormal   Collection Time: 12/30/15  9:03 AM  Result Value Ref Range   hCG, Beta Chain, Quant, S 18030 (H) <5 mIU/mL  CBC     Status: Abnormal   Collection Time: 12/30/15  9:03 AM  Result Value Ref Range   WBC 9.3 4.0 - 10.5 K/uL   RBC 3.73 (L) 3.87 - 5.11 MIL/uL   Hemoglobin 10.4 (L) 12.0 - 15.0 g/dL   HCT 16.1 (L) 09.6 - 04.5 %   MCV 85.0 78.0 - 100.0 fL   MCH 27.9 26.0 - 34.0  pg   MCHC 32.8 30.0 - 36.0 g/dL   RDW 40.9 (H) 81.1 - 91.4 %   Platelets 278 150 - 400 K/uL    IMAGING US Ob Comp Less 14 Wks  12/30/2015  CLINICAL DATA:  Heavy vaginal bleeding since yesterday. Gestational age by LMP of 10 weeks 0 days. Quantitative beta HCG level of 18,030. EXAM: OBSTETRIC <14 WK ULTRASOUND TECHNIQUE: Transabdominal ultrasound was performed for evaluation of the gestation as well as the maternal uterus and adnexal regions. COMPARISON:  None. FINDINGS: No intrauterine gestational sac is identified. Endometrial thickness measures approximately 16 mm. Minimal amount of fluid or blood noted in the endometrial cavity. No fibroids identified. Both ovaries are normal in appearance. There is a large complex cystic lesion in the left adnexa which is separate from the left ovary. This contains a large area of central blood clot which shows no internal blood flow on color Doppler ultrasound, as well as fluid. This measures 11.2 x 7.2 by 8.9 cm, and is suspicious for pneumatocele thinks secondary to ectopic pregnancy. A small amount of free fluid is seen in the left adnexa. IMPRESSION: No intrauterine gestational sac visualized. 11 cm complex left adnexal mass and small amount free fluid, highly suspicious for ectopic pregnancy. Critical Value/emergent results were called by telephone at the time of interpretation on 12/30/2015 at 10:43 am to [facility] in MAU, who verbally acknowledged these results. Electronically Signed   By: Myles Rosenthal M.D.   On: 12/30/2015 10:48    MAU COURSE CBC, Quant, ultrasound, wet prep and GC/chlamydia culture, UA.  Dr. Eppie Gibson, radiologist called to report high suspicion for left ectopic pregnancy. Dr. Ellyn Hack notified of history, exam, ultrasound results.  MDM - High suspicion for left ectopic pregnancy and large hematosalpinx requiring surgery. Radiologist does not think that ectopic pregnancy has ruptured. Hemodynamically stable. Suspect the tachycardia is  primarily from anxiety, but we'll continue to observe closely.  ASSESSMENT 1. Ectopic pregnancy   2. Vaginal bleeding during pregnancy, antepartum   3. Lower abdominal pain     PLAN Prepped for OR per Dr. Ellyn Hack. Toradol  Dorathy Kinsman, [place] 12/30/2015  11:38 AM  4

## 2015-12-30 NOTE — H&P (Signed)
Amy Mclean is an 25 y.o. female [phone] at 5010+ with left ectopic pregnancy - quant = 18,000, measuring 11cm.  D/W pt r/b/a of operative L/S, removal of ectopic pregnancy, left salpingectomy.  Pt with some cramping pain.    Pertinent Gynecological History:  G6 P3023 SVD x 3, PP Pre G3, 7-8# TAB x 2  H/o Chl, trich No abn pap  Menstrual History:  Patient's last menstrual period was 10/21/2015.    Past Medical History  Diagnosis Date  . No pertinent past medical history   . Urinary tract infection   . Chlamydia   . Anemia   . Hx of varicella   . SVD (spontaneous vaginal delivery) 08/23/2014  . High blood pressure     Past Surgical History  Procedure Laterality Date  . No past surgeries    TAB x 2  Family History  Problem Relation Age of Onset  . Alcohol abuse Neg Hx   . Arthritis Neg Hx   . Birth defects Neg Hx   . Asthma Neg Hx   . Cancer Neg Hx   . COPD Neg Hx   . Depression Neg Hx   . Diabetes Neg Hx   . Drug abuse Neg Hx   . Early death Neg Hx   . Hearing loss Neg Hx   . Heart disease Neg Hx   . Hyperlipidemia Neg Hx   . Kidney disease Neg Hx   . Learning disabilities Neg Hx   . Mental illness Neg Hx   . Mental retardation Neg Hx   . Miscarriages / Stillbirths Neg Hx   . Vision loss Neg Hx   . Stroke Neg Hx   . Hypertension Father   . Hypertension Maternal Grandmother     Social History:  reports that she has never smoked. She has never used smokeless tobacco. She reports that she does not drink alcohol or use illicit drugs.single, SAHM  Allergies: No Known Allergies  Meds none  Review of Systems  Constitutional: Negative.   HENT: Negative.   Eyes: Negative.   Respiratory: Negative.   Cardiovascular: Negative.   Gastrointestinal: Positive for abdominal pain.  Genitourinary: Negative.   Musculoskeletal: Negative.   Skin: Negative.   Neurological: Negative.   Psychiatric/Behavioral: Negative.     Blood pressure 119/85, pulse 107,  temperature 98 F (36.7 C), temperature source Oral, resp. rate 18, last menstrual period 10/21/2015, SpO2 100 %, unknown if currently breastfeeding. Physical Exam  Constitutional: She is oriented to person, place, and time. She appears well-developed and well-nourished.  HENT:  Head: Normocephalic and atraumatic.  Cardiovascular: Normal rate and regular rhythm.   Respiratory: Effort normal and breath sounds normal. No respiratory distress. She has no wheezes.  GI: Soft. Bowel sounds are normal. She exhibits no distension. There is tenderness.  Musculoskeletal: Normal range of motion.  Neurological: She is alert and oriented to person, place, and time.  Skin: Skin is warm and dry.  Psychiatric: She has a normal mood and affect. Her behavior is normal.    Results for orders placed or performed during the hospital encounter of 12/30/15 (from the past 24 hour(s))  hCG, quantitative, pregnancy     Status: Abnormal   Collection Time: 12/30/15  9:03 AM  Result Value Ref Range   hCG, Beta Chain, Quant, S 18030 (H) <5 mIU/mL  CBC     Status: Abnormal   Collection Time: 12/30/15  9:03 AM  Result Value Ref Range   WBC 9.3 4.0 -  10.5 K/uL   RBC 3.73 (L) 3.87 - 5.11 MIL/uL   Hemoglobin 10.4 (L) 12.0 - 15.0 g/dL   HCT 78.2 (L) 95.6 - 21.3 %   MCV 85.0 78.0 - 100.0 fL   MCH 27.9 26.0 - 34.0 pg   MCHC 32.8 30.0 - 36.0 g/dL   RDW 08.6 (H) 57.8 - 46.9 %   Platelets 278 150 - 400 K/uL  Type and screen     Status: None (Preliminary result)   Collection Time: 12/30/15 11:44 AM  Result Value Ref Range   ABO/RH(D) O POS    Antibody Screen PENDING    Sample Expiration 01/02/2016     US Ob Comp Less 14 Wks  12/30/2015  CLINICAL DATA:  Heavy vaginal bleeding since yesterday. Gestational age by LMP of 10 weeks 0 days. Quantitative beta HCG level of 18,030. EXAM: OBSTETRIC <14 WK ULTRASOUND TECHNIQUE: Transabdominal ultrasound was performed for evaluation of the gestation as well as the maternal uterus  and adnexal regions. COMPARISON:  None. FINDINGS: No intrauterine gestational sac is identified. Endometrial thickness measures approximately 16 mm. Minimal amount of fluid or blood noted in the endometrial cavity. No fibroids identified. Both ovaries are normal in appearance. There is a large complex cystic lesion in the left adnexa which is separate from the left ovary. This contains a large area of central blood clot which shows no internal blood flow on color Doppler ultrasound, as well as fluid. This measures 11.2 x 7.2 by 8.9 cm, and is suspicious for pneumatocele thinks secondary to ectopic pregnancy. A small amount of free fluid is seen in the left adnexa. IMPRESSION: No intrauterine gestational sac visualized. 11 cm complex left adnexal mass and small amount free fluid, highly suspicious for ectopic pregnancy. Critical Value/emergent results were called by telephone at the time of interpretation on 12/30/2015 at 10:43 am to [facility] in MAU, who verbally acknowledged these results. Electronically Signed   By: Myles Rosenthal M.D.   On: 12/30/2015 10:48    Assessment/Plan: 25yo [id number] at 10+ with left ectopic preg D/w rba of operative L/S, removal of ectopic preg, salpingectomy Will proceed at 3:15 given OR schedule, will move more rapidly if situation worsens  Bovard-Stuckert, Yu Peggs 12/30/2015, 12:45 PM

## 2015-12-30 NOTE — Transfer of Care (Signed)
Immediate Anesthesia Transfer of Care Note  Patient: Amy Mclean  Procedure(s) Performed: Procedure(s): LAPAROSCOPY DIAGNOSTIC (N/A)  Patient Location: PACU  Anesthesia Type:General  Level of Consciousness: awake, alert  and oriented  Airway & Oxygen Therapy: Patient Spontanous Breathing and Patient connected to nasal cannula oxygen  Post-op Assessment: Report given to RN and Post -op Vital signs reviewed and stable  Post vital signs: Reviewed and stable  Last Vitals:  Filed Vitals:   12/30/15 1421 12/30/15 1628  BP:  123/84  Pulse: 116   Temp:  36.8 C  Resp:      Last Pain:  Filed Vitals:   12/30/15 1630  PainSc: 6          Complications: No apparent anesthesia complications

## 2015-12-30 NOTE — Progress Notes (Signed)
I offered follow-up support throughout the day to [facility].  She was much less anxious at times when her family was able to be present with her.  During my last time with her, her mother was with her.  They were very appreciative of the support throughout the day from all of the staff.  Chaplain Dyanne CarrelKaty Yordy Matton, Bcc Pager, [phone] 3:14 PM    12/30/15 1500  Clinical Encounter Type  Visited With Patient and family together  Visit Type Follow-up;Spiritual support

## 2015-12-30 NOTE — Anesthesia Preprocedure Evaluation (Addendum)
Anesthesia Evaluation  Patient identified by MRN, date of birth, ID band Patient awake    Reviewed: Allergy & Precautions, NPO status , Patient's Chart, lab work & pertinent test results  History of Anesthesia Complications Negative for: history of anesthetic complications  Airway Mallampati: II  TM Distance: >3 FB Neck ROM: Full    Dental  (+) Dental Advisory Given   Pulmonary neg pulmonary ROS,    breath sounds clear to auscultation       Cardiovascular hypertension (with prior pregnancy),  Rhythm:Regular Rate:Normal     Neuro/Psych negative neurological ROS     GI/Hepatic negative GI ROS, Neg liver ROS,   Endo/Other  negative endocrine ROS  Renal/GU negative Renal ROS     Musculoskeletal   Abdominal   Peds  Hematology  (+) Blood dyscrasia (Hb 10.4, plt 278K), ,   Anesthesia Other Findings   Reproductive/Obstetrics (+) Pregnancy                           Anesthesia Physical Anesthesia Plan  ASA: II and emergent  Anesthesia Plan: General   Post-op Pain Management:    Induction: Intravenous and Rapid sequence  Airway Management Planned: Oral ETT  Additional Equipment:   Intra-op Plan:   Post-operative Plan: Extubation in OR  Informed Consent: I have reviewed the patients History and Physical, chart, labs and discussed the procedure including the risks, benefits and alternatives for the proposed anesthesia with the patient or authorized representative who has indicated his/her understanding and acceptance.   Dental advisory given  Plan Discussed with: CRNA and Surgeon  Anesthesia Plan Comments: (Plan routine monitors, GETA)        Anesthesia Quick Evaluation

## 2015-12-30 NOTE — Anesthesia Postprocedure Evaluation (Signed)
Anesthesia Post Note  Patient: Amy Mclean  Procedure(s) Performed: Procedure(s) (LRB): LAPAROSCOPY DIAGNOSTIC (N/A)  Patient location during evaluation: PACU Anesthesia Type: General Level of consciousness: awake and alert and oriented Pain management: pain level controlled Vital Signs Assessment: post-procedure vital signs reviewed and stable Respiratory status: spontaneous breathing, nonlabored ventilation and respiratory function stable Cardiovascular status: blood pressure returned to baseline and stable Postop Assessment: no signs of nausea or vomiting Anesthetic complications: no     Last Vitals:  Filed Vitals:   12/30/15 1700 12/30/15 1715  BP: 115/87 127/92  Pulse: 103 104  Temp:    Resp: 18 14    Last Pain:  Filed Vitals:   12/30/15 1717  PainSc: 6    Pain Goal:                 Savaughn Karwowski A.

## 2015-12-30 NOTE — Anesthesia Procedure Notes (Signed)
Procedure Name: Intubation Date/Time: 12/30/2015 3:19 PM Performed by: Rica RecordsICKELTON, Mamoru Takeshita Pre-anesthesia Checklist: Patient identified, Emergency Drugs available, Suction available and Patient being monitored Patient Re-evaluated:Patient Re-evaluated prior to inductionOxygen Delivery Method: Circle system utilized Preoxygenation: Pre-oxygenation with 100% oxygen Intubation Type: IV induction, Cricoid Pressure applied and Rapid sequence Laryngoscope Size: Miller and 2 Grade View: Grade I Tube type: Oral Tube size: 7.0 mm Number of attempts: 1 Airway Equipment and Method: Stylet Placement Confirmation: ETT inserted through vocal cords under direct vision,  positive ETCO2 and breath sounds checked- equal and bilateral Secured at: 22 (teeth) cm Tube secured with: Tape Dental Injury: Teeth and Oropharynx as per pre-operative assessment

## 2015-12-30 NOTE — MAU Note (Signed)
[facility]  Pt states she started spotting at 2100 last night.  Pt states it was dark brown and she put tissue down there.  Pt states when she got home she put on a pad.  Pt states she started to bleed more like a period.  Pt states it was the worst at 0630 this morning.  Pt states she ran to the toilet and started bleeding bright red blood.  Pt states when the bleeding slowed down she flushed the toilet and did not look at it.  Pt states she then put a rag down there and it was covered.  Pt then called EMS.  Pt states that once she has been with EMS it has just been like a regular period and she is having some cramping.

## 2015-12-30 NOTE — Discharge Instructions (Signed)

## 2015-12-30 NOTE — Progress Notes (Signed)
I received a referral from pt's nurse due to pt anxiety.  Pt was brought in by EMS and was very anxious about her blood loss.  She reported that she has panic feelings and high anxiety that have worsened since her last pregnancy 1 year ago. Her anxiety was also elevated following her 2nd pregnancy 9 years ago and then was somewhat better until her she became pregnant with her 3rd child.  She said she "assumes the worst" in situations and she finds it difficult to calm her mind and her heart.  She was very anxious to find out that she may be having surgery.  Her grief is complicated by mixed emotions surrounding this unplanned pregnancy.  She now has family surrounding her and she feels that they are very supportive, particularly her sister and her mother (whom she lives with).  She was eager to get counseling referrals to help her with her anxiety and we spoke about the possibility that it may be related to PMAD and I encouraged her to speak with her OB or her PCP about her anxiety.  Right now her focus is on the surgery and  I will provide more grief support and grief resources after her procedure.  Chaplain Dyanne CarrelKaty Brok Stocking, Bcc Pager, [phone] 12:12 PM    12/30/15 1200  Clinical Encounter Type  Visited With Patient  Visit Type Spiritual support  Referral From Nurse  Spiritual Encounters  Spiritual Needs Emotional;Prayer;Grief support  Stress Factors  Patient Stress Factors (Anxiety)

## 2015-12-30 NOTE — Brief Op Note (Signed)
12/30/2015  4:27 PM  PATIENT:  Joslyn HyAlysia L Kell  25 y.o. female  PRE-OPERATIVE DIAGNOSIS:  L tubal ectopic  POST-OPERATIVE DIAGNOSIS:  Extensive clotting in vagina, nl pelvis on laparoscopy, complete SAB  PROCEDURE:  Procedure(s): LAPAROSCOPY DIAGNOSTIC (N/A)  SURGEON:  Surgeon(s) and Role:    * Khamiya Varin Bovard-Stuckert, MD - Primary  ANESTHESIA:   general and marcaine   EBL:  Total I/O In: 1000 [I.V.:1000] Out: 600 [Urine:400; Blood:200]  BLOOD ADMINISTERED:none  DRAINS: none   LOCAL MEDICATIONS USED:  MARCAINE    and Amount: 10 ml  SPECIMEN:  Source of Specimen:  gestational sac  DISPOSITION OF SPECIMEN:  PATHOLOGY  COUNTS:  YES  TOURNIQUET:  * No tourniquets in log *  DICTATION: .Other Dictation: Dictation Number [id number]  PLAN OF CARE: Discharge to home after PACU  PATIENT DISPOSITION:  PACU - hemodynamically stable.   Delay start of Pharmacological VTE agent (>24hrs) due to surgical blood loss or risk of bleeding: yes

## 2015-12-30 NOTE — Progress Notes (Signed)
CSW met with patient at the request of hospital's Chaplin.  MOB was polite, inviting, and receptive to meeting with CSW.  Patient had several room guest, and gave CSW permission to meet with patient while guest were present. CSW inquired about patient's anxiety and patient reported the older she gets the more anxious she becomes.  Patient denied previous Gray treatment and interventions; however, she was interested in referrals for outpatient therapy.  CSW provided patient with behavioral health provider list, and encouraged patient to make an appointment.  Patient assured CSW that she was going to follow-up with provided list after hospital discharge.  Patient was tearful, and CSW assisted patient with processing her thoughts and feelings. CSW thanked patient for meeting with CSW, and patient did not have any questions at this time.

## 2015-12-31 LAB — HIV ANTIBODY (ROUTINE TESTING W REFLEX): HIV SCREEN 4TH GENERATION: NONREACTIVE

## 2015-12-31 NOTE — Op Note (Signed)
NAME:  Amy Mclean, Amy Mclean             ACCOUNT NO.:  [id number]  MEDICAL RECORD NO.:  [id number]  LOCATION:  [facility]                          FACILITY:  [facility]  PHYSICIAN:  Sherron MondayJody Bovard, MD        DATE OF BIRTH:  Jul 01, 1990  DATE OF PROCEDURE:  12/30/2015 DATE OF DISCHARGE:  12/30/2015                              OPERATIVE REPORT   PREOPERATIVE DIAGNOSIS:  Left adnexal mass, likely tubal ectopic pregnancy.  POSTOPERATIVE DIAGNOSES:  Extensive clotting in the vagina.  Normal pelvis, bilateral tubes and ovaries on laparoscopy.  Complete miscarriage by gestational sac found in the vagina.  PROCEDURE:  Diagnostic laparoscopy.  SURGEON:  Sherron MondayJody Bovard, MD.  ANESTHESIA:  General with 10 mL of 0.25% Marcaine.  IV FLUIDS:  A 1000 mL.  URINE OUTPUT:  400 mL clear urine at the end of the procedure, 200 mL blood clot in the vagina.  PATHOLOGY:  Gestational sac to pathology.  PROCEDURE IN DETAIL:  After informed consent was reviewed with the patient and her mother, she was transported to the operating room, placed on the table in supine position.  Once anesthesia had been induced and found to be adequate, she was then prepped and draped in a normal sterile fashion.  A speculum was placed in the vagina.  After the Foley catheter had been placed, a large amount of clot was evacuated from her vagina, approximately 200-300 mL.  Mixed in with the clot was a small amount of tissue thought to perhaps be gestational sac.  A Hulka manipulator was placed in her vagina.  Gloves were changed.  Attention was turned to the abdominal portion of the case.  As per the radiology reading, there was an 11 cm adnexal mass.  The decision was made to proceed with evaluation of this.  The Hasson retractor was placed under open laparoscopy at the umbilicus.  An approximately 2 cm infraumbilical incision was made and carried through the underlying fascia.  The fascia was elevated with Kocher clamps and entered sharply.   A 0 Vicryl on a UR was placed to hold the Hasson.  The Hasson was placed in.  The pneumoperitoneum was obtained.  Normal uterus was noted.  Normal adnexa were also seen.  The decision was made to help manipulate tissue, a 5 mm port was placed in the left under direct visualization, again verifying normal anatomy with normal tubes and ovaries as well as uterus.  Liver edge was also seen, appendix was not seen.  A call was made to Radiology to discuss with them the findings versus the ultrasound findings and the likelihood of a large amount of clot in the vagina being mistaken for a left adnexal mass was discussed.  The instruments were removed after the pneumoperitoneum was evacuated.  The umbilical incision was closed with the suture that had been held with 0 Vicryl.  The skin was closed with 4- 0 Vicryl in subcuticular fashion.  Dermabond was also placed.  The 5 mm port was closed, subcuticular with 4-0 Vicryl, and Dermabond was placed.  The patient tolerated the procedure well.  Sponge, lap, and needle counts were correct x2.  The Hulka manipulator was removed from her vagina  prior to awakening her.  She was then returned to supine position.  She was awakened.     Sherron Monday, MD     JB/MEDQ  D:  12/30/2015  T:  12/31/2015  Job:  [id number]

## 2016-01-02 ENCOUNTER — Encounter (HOSPITAL_COMMUNITY): Payer: Self-pay | Admitting: Obstetrics and Gynecology

## 2016-06-18 NOTE — L&D Delivery Note (Signed)
Delivery Note After pt was comfortable w/epidrual, AROM at 8 cms.  Shortly after, At 1:23 AM a viable female was delivered via Vaginal, Spontaneous (Presentation: ROA;  ).  APGAR: 9/9 ; weight  .  pending After 1 minute, the cord was clamped and cut. 40 units of pitocin diluted in 1000cc LR was infused rapidly IV.  The placenta separated spontaneously and delivered via CCT and maternal pushing effort.  It was inspected and appears to be intact with a 3 VC.    Anesthesia:  epidrual Episiotomy:  none Lacerations:  none Suture Repair:  Est. Blood Loss (mL):  150  Mom to postpartum.  Baby to Couplet care / Skin to Skin.  The above was performed by Amy CroonBianca Seively, PA-S under my direct supervision and guidance.     Please schedule this patient for PP visit in: 4 weeks Low risk pregnancy complicated by:  Delivery mode:  SVD Anticipated Birth Control:  Plans Interval BTL PP Procedures needed:   Schedule Integrated BH visit: no Provider: Any provider  CRESENZO-DISHMAN,Amy Mclean 05/17/2017, 1:34 AM

## 2016-08-06 ENCOUNTER — Inpatient Hospital Stay (HOSPITAL_COMMUNITY): Payer: Medicaid Other

## 2016-08-06 ENCOUNTER — Encounter (HOSPITAL_COMMUNITY): Payer: Self-pay | Admitting: *Deleted

## 2016-08-06 ENCOUNTER — Inpatient Hospital Stay (HOSPITAL_COMMUNITY)
Admission: AD | Admit: 2016-08-06 | Discharge: 2016-08-06 | Disposition: A | Discharge status: Home / Self Care | Payer: Medicaid Other | Source: Ambulatory Visit | Attending: Obstetrics and Gynecology | Admitting: Obstetrics and Gynecology

## 2016-08-06 DIAGNOSIS — Z3A01 Less than 8 weeks gestation of pregnancy: Secondary | ICD-10-CM | POA: Diagnosis not present

## 2016-08-06 DIAGNOSIS — O26851 Spotting complicating pregnancy, first trimester: Secondary | ICD-10-CM | POA: Insufficient documentation

## 2016-08-06 DIAGNOSIS — O209 Hemorrhage in early pregnancy, unspecified: Secondary | ICD-10-CM

## 2016-08-06 DIAGNOSIS — N7011 Chronic salpingitis: Secondary | ICD-10-CM

## 2016-08-06 DIAGNOSIS — O23521 Salpingo-oophoritis in pregnancy, first trimester: Secondary | ICD-10-CM | POA: Diagnosis not present

## 2016-08-06 DIAGNOSIS — O3680X0 Pregnancy with inconclusive fetal viability, not applicable or unspecified: Secondary | ICD-10-CM

## 2016-08-06 LAB — CBC
HEMATOCRIT: 29.3 % — AB (ref 36.0–46.0)
HEMOGLOBIN: 9.3 g/dL — AB (ref 12.0–15.0)
MCH: 24.9 pg — AB (ref 26.0–34.0)
MCHC: 31.7 g/dL (ref 30.0–36.0)
MCV: 78.6 fL (ref 78.0–100.0)
Platelets: 300 10*3/uL (ref 150–400)
RBC: 3.73 MIL/uL — ABNORMAL LOW (ref 3.87–5.11)
RDW: 17.2 % — ABNORMAL HIGH (ref 11.5–15.5)
WBC: 8 10*3/uL (ref 4.0–10.5)

## 2016-08-06 LAB — POCT PREGNANCY, URINE: PREG TEST UR: POSITIVE — AB

## 2016-08-06 LAB — WET PREP, GENITAL
Sperm: NONE SEEN
TRICH WET PREP: NONE SEEN
Yeast Wet Prep HPF POC: NONE SEEN

## 2016-08-06 LAB — HCG, QUANTITATIVE, PREGNANCY: hCG, Beta Chain, Quant, S: 6467 m[IU]/mL — ABNORMAL HIGH (ref <5)

## 2016-08-06 NOTE — MAU Provider Note (Signed)
Chief Complaint: No chief complaint on file.   First Provider Initiated Contact with Patient 08/06/16 1439      SUBJECTIVE HPI: Amy Mclean is a 26 y.o. [id number] at [redacted]w[redacted]d by LMP who presents to maternity admissions reporting onset of spotting yesterday then heavy vaginal bleeding like menstrual bleeding today.  This is a new symptom.  She is soaking 1/2 pad every hour since onset of bleeding. She has not tried any treatments, nothing makes it better or worse.  It has no associated symptoms, and she denies pain. She had positive pregnancy test at home 2 weeks ago. She denies vaginal itching/burning, urinary symptoms, h/a, dizziness, n/v, or fever/chills.     HPI  Past Medical History:  Diagnosis Date  . Adnexal mass 12/30/2015  . Anemia   . Chlamydia   . Complete miscarriage 12/30/2015  . High blood pressure   . Hx of varicella   . No pertinent past medical history   . SVD (spontaneous vaginal delivery) 08/23/2014  . Urinary tract infection    Past Surgical History:  Procedure Laterality Date  . LAPAROSCOPY N/A 12/30/2015   Procedure: LAPAROSCOPY DIAGNOSTIC;  Surgeon: Sherian Rein, MD;  Location: [facility];  Service: Gynecology;  Laterality: N/A;  . NO PAST SURGERIES     Social History   Social History  . Marital status: Single    Spouse name: N/A  . Number of children: N/A  . Years of education: N/A   Occupational History  . Not on file.   Social History Main Topics  . Smoking status: Never Smoker  . Smokeless tobacco: Never Used  . Alcohol use No  . Drug use: No  . Sexual activity: Not Currently    Birth control/ protection: None   Other Topics Concern  . Not on file   Social History Narrative  . No narrative on file   No current facility-administered medications on file prior to encounter.    Current Outpatient Prescriptions on File Prior to Encounter  Medication Sig Dispense Refill  . ibuprofen (ADVIL,MOTRIN) 200 MG tablet Take 3 tablets (600 mg  total) by mouth every 6 (six) hours as needed for moderate pain or cramping. 30 tablet 0   No Known Allergies  ROS:  Review of Systems  Constitutional: Negative for chills, fatigue and fever.  Respiratory: Negative for shortness of breath.   Cardiovascular: Negative for chest pain.  Genitourinary: Positive for pelvic pain. Negative for difficulty urinating, dysuria, flank pain, vaginal bleeding, vaginal discharge and vaginal pain.  Neurological: Negative for dizziness and headaches.  Psychiatric/Behavioral: Negative.      I have reviewed patient's Past Medical Hx, Surgical Hx, Family Hx, Social Hx, medications and allergies.   Physical Exam  Patient Vitals for the past 24 hrs:  BP Temp Temp src Pulse Resp SpO2 Height Weight  08/06/16 1323 124/81 97.9 F (36.6 C) Oral 118 18 100 % 5\' 7"  (1.702 m) 127 lb (57.6 kg)   Constitutional: Well-developed, well-nourished female in no acute distress.  Cardiovascular: normal rate Respiratory: normal effort GI: Abd soft, non-tender. Pos BS x 4 MS: Extremities nontender, no edema, normal ROM Neurologic: Alert and oriented x 4.  GU: Neg CVAT.  PELVIC EXAM: Cervix pink, visually closed, without lesion, large amount dark red bleeding with small clots, vaginal walls and external genitalia normal Bimanual exam: Cervix 0/long/high, firm, anterior, neg CMT, uterus nontender, nonenlarged, adnexa without tenderness, enlargement, or mass   LAB RESULTS  Results for orders placed or performed during the  hospital encounter of 08/06/16 (from the past 168 hour(s))  GC/Chlamydia probe amp ([place]   Collection Time: 08/06/16 12:00 AM  Result Value Ref Range   Chlamydia Negative    Neisseria gonorrhea Negative   CBC   Collection Time: 08/06/16  2:43 PM  Result Value Ref Range   WBC 8.0 4.0 - 10.5 K/uL   RBC 3.73 (L) 3.87 - 5.11 MIL/uL   Hemoglobin 9.3 (L) 12.0 - 15.0 g/dL   HCT 19.129.3 (L) 47.836.0 - 29.546.0 %   MCV 78.6 78.0 - 100.0 fL    MCH 24.9 (L) 26.0 - 34.0 pg   MCHC 31.7 30.0 - 36.0 g/dL   RDW 62.117.2 (H) 30.811.5 - 65.715.5 %   Platelets 300 150 - 400 K/uL  hCG, quantitative, pregnancy   Collection Time: 08/06/16  2:43 PM  Result Value Ref Range   hCG, Beta Chain, Quant, S 6,467 (H) <5 mIU/mL  Wet prep, genital   Collection Time: 08/06/16  2:48 PM  Result Value Ref Range   Yeast Wet Prep HPF POC NONE SEEN NONE SEEN   Trich, Wet Prep NONE SEEN NONE SEEN   Clue Cells Wet Prep HPF POC PRESENT (A) NONE SEEN   WBC, Wet Prep HPF POC FEW (A) NONE SEEN   Sperm NONE SEEN   Pregnancy, urine POC   Collection Time: 08/06/16  3:01 PM  Result Value Ref Range   Preg Test, Ur POSITIVE (A) NEGATIVE    --/--/O POS (07/14 1144)  IMAGING No results found.  MAU Management/MDM: Ordered labs and reviewed results. Findings today could represent a normal early pregnancy, spontaneous abortion or ectopic pregnancy which can be life-threatening.  Ectopic precautions were given to the patient with plan to return to office in 48 hours for stat quant hcg to evaluate pregnancy development. Consult Dr Debroah LoopArnold r/t hydrosalpinx findings on US.  With no fever, elevated WBCs or pain, unlikely infectious.  Pt given precautions, reviewed s/sx of PID.  Consider US follow up in 2-3 months.  GCC pending. Pt stable at time of discharge.  ASSESSMENT  1. Pregnancy of unknown anatomic location   2. Vaginal bleeding in pregnancy, first trimester   3. Hydrosalpinx    PLAN Discharge home with bleeding and ectopic precautions     Sharen CounterLisa Leftwich-Kirby Certified Nurse-Midwife 08/06/2016  2:48 PM

## 2016-08-06 NOTE — Discharge Instructions (Signed)
Vaginal Bleeding During Pregnancy, First Trimester °A small amount of bleeding (spotting) from the vagina is relatively common in early pregnancy. It usually stops on its own. Various things may cause bleeding or spotting in early pregnancy. Some bleeding may be related to the pregnancy, and some may not. In most cases, the bleeding is normal and is not a problem. However, bleeding can also be a sign of something serious. Be sure to tell your health care provider about any vaginal bleeding right away. °Some possible causes of vaginal bleeding during the first trimester include: °· Infection or inflammation of the cervix. °· Growths (polyps) on the cervix. °· Miscarriage or threatened miscarriage. °· Pregnancy tissue has developed outside of the uterus and in a fallopian tube (tubal pregnancy). °· Tiny cysts have developed in the uterus instead of pregnancy tissue (molar pregnancy). °Follow these instructions at home: °Watch your condition for any changes. The following actions may help to lessen any discomfort you are feeling: °· Follow your health care provider's instructions for limiting your activity. If your health care provider orders bed rest, you may need to stay in bed and only get up to use the bathroom. However, your health care provider may allow you to continue light activity. °· If needed, make plans for someone to help with your regular activities and responsibilities while you are on bed rest. °· Keep track of the number of pads you use each day, how often you change pads, and how soaked (saturated) they are. Write this down. °· Do not use tampons. Do not douche. °· Do not have sexual intercourse or orgasms until approved by your health care provider. °· If you pass any tissue from your vagina, save the tissue so you can show it to your health care provider. °· Only take over-the-counter or prescription medicines as directed by your health care provider. °· Do not take aspirin because it can make you  bleed. °· Keep all follow-up appointments as directed by your health care provider. °Contact a health care provider if: °· You have any vaginal bleeding during any part of your pregnancy. °· You have cramps or labor pains. °· You have a fever, not controlled by medicine. °Get help right away if: °· You have severe cramps in your back or belly (abdomen). °· You pass large clots or tissue from your vagina. °· Your bleeding increases. °· You feel light-headed or weak, or you have fainting episodes. °· You have chills. °· You are leaking fluid or have a gush of fluid from your vagina. °· You pass out while having a bowel movement. °This information is not intended to replace advice given to you by your health care provider. Make sure you discuss any questions you have with your health care provider. °Document Released: 03/14/2005 Document Revised: 11/10/2015 Document Reviewed: 02/09/2013 °Elsevier Interactive Patient Education © 2017 [employer] ° °

## 2016-08-06 NOTE — MAU Note (Signed)
Patient c/o lower cramping like abdominal pain that start this am; followed by vaginal bleeding spotting in nature. Dark red appearance. Rating pain 7/10 currently . Have not taken anything for the pain.

## 2016-08-07 LAB — GC/CHLAMYDIA PROBE AMP (~~LOC~~) NOT AT ARMC
CHLAMYDIA, DNA PROBE: NEGATIVE
NEISSERIA GONORRHEA: NEGATIVE

## 2016-08-08 ENCOUNTER — Ambulatory Visit: Payer: Medicaid Other | Admitting: *Deleted

## 2016-08-08 ENCOUNTER — Encounter (HOSPITAL_COMMUNITY): Payer: Self-pay | Admitting: *Deleted

## 2016-08-08 ENCOUNTER — Encounter: Payer: Self-pay | Admitting: General Practice

## 2016-08-08 ENCOUNTER — Inpatient Hospital Stay (HOSPITAL_COMMUNITY)
Admission: AD | Admit: 2016-08-08 | Discharge: 2016-08-08 | Disposition: A | Discharge status: Home / Self Care | Payer: Medicaid Other | Source: Ambulatory Visit | Attending: Obstetrics and Gynecology | Admitting: Obstetrics and Gynecology

## 2016-08-08 DIAGNOSIS — O039 Complete or unspecified spontaneous abortion without complication: Secondary | ICD-10-CM | POA: Diagnosis not present

## 2016-08-08 DIAGNOSIS — Z3A01 Less than 8 weeks gestation of pregnancy: Secondary | ICD-10-CM | POA: Diagnosis not present

## 2016-08-08 DIAGNOSIS — O209 Hemorrhage in early pregnancy, unspecified: Secondary | ICD-10-CM | POA: Diagnosis present

## 2016-08-08 LAB — CBC
HCT: 28.4 % — ABNORMAL LOW (ref 36.0–46.0)
Hemoglobin: 9.1 g/dL — ABNORMAL LOW (ref 12.0–15.0)
MCH: 24.9 pg — AB (ref 26.0–34.0)
MCHC: 32 g/dL (ref 30.0–36.0)
MCV: 77.8 fL — AB (ref 78.0–100.0)
PLATELETS: 289 10*3/uL (ref 150–400)
RBC: 3.65 MIL/uL — ABNORMAL LOW (ref 3.87–5.11)
RDW: 17.1 % — AB (ref 11.5–15.5)
WBC: 7.8 10*3/uL (ref 4.0–10.5)

## 2016-08-08 LAB — HCG, QUANTITATIVE, PREGNANCY: hCG, Beta Chain, Quant, S: 956 m[IU]/mL — ABNORMAL HIGH (ref <5)

## 2016-08-08 MED ORDER — HYDROMORPHONE HCL 1 MG/ML IJ SOLN
1.0000 mg | Freq: Once | INTRAMUSCULAR | Status: AC
  Administered 2016-08-08: 1 mg via INTRAMUSCULAR
  Filled 2016-08-08: qty 1

## 2016-08-08 MED ORDER — HYDROMORPHONE HCL 1 MG/ML IJ SOLN: 1.0000 mg | Freq: Once | INTRAMUSCULAR | Status: DC

## 2016-08-08 MED ORDER — OXYCODONE-ACETAMINOPHEN 5-325 MG PO TABS: 1.0000 | ORAL_TABLET | Freq: Four times a day (QID) | ORAL | 0 refills | Status: DC | PRN

## 2016-08-08 NOTE — MAU Provider Note (Signed)
History     CSN: [id number]  Arrival date and time: 08/08/16 1621   First Provider Initiated Contact with Patient 08/08/16 1701      Chief Complaint  Patient presents with  . Vaginal Bleeding   HPI [name] is a 26 y.o. [phone] at [redacted]w[redacted]d who presents to MAU today with complaint of vaginal bleeding and lower abdominal pain. The patient was confirmed SAB in progress this morning in the clinic. She states around 1500 bleeding increased and she started passing clots. She denies fever. She state pain has increased. She took Ibuprofen this morning, but nothing since.    OB History    Gravida Para Term Preterm AB Living   8 3 3  0 3 3   SAB TAB Ectopic Multiple Live Births   2 0 0 0 3      Past Medical History:  Diagnosis Date  . Adnexal mass 12/30/2015  . Anemia   . Chlamydia   . Complete miscarriage 12/30/2015  . High blood pressure   . Hx of varicella   . No pertinent past medical history   . SVD (spontaneous vaginal delivery) 08/23/2014  . Urinary tract infection     Past Surgical History:  Procedure Laterality Date  . LAPAROSCOPY N/A 12/30/2015   Procedure: LAPAROSCOPY DIAGNOSTIC;  Surgeon: Sherian Rein, MD;  Location: [facility];  Service: Gynecology;  Laterality: N/A;  . NO PAST SURGERIES      Family History  Problem Relation Age of Onset  . Hypertension Father   . Hypertension Maternal Grandmother   . Alcohol abuse Neg Hx   . Arthritis Neg Hx   . Birth defects Neg Hx   . Asthma Neg Hx   . Cancer Neg Hx   . COPD Neg Hx   . Depression Neg Hx   . Diabetes Neg Hx   . Drug abuse Neg Hx   . Early death Neg Hx   . Hearing loss Neg Hx   . Heart disease Neg Hx   . Hyperlipidemia Neg Hx   . Kidney disease Neg Hx   . Learning disabilities Neg Hx   . Mental illness Neg Hx   . Mental retardation Neg Hx   . Miscarriages / Stillbirths Neg Hx   . Vision loss Neg Hx   . Stroke Neg Hx     Social History  Substance Use Topics  . Smoking status: Never  Smoker  . Smokeless tobacco: Never Used  . Alcohol use No    Allergies: No Known Allergies  Prescriptions Prior to Admission  Medication Sig Dispense Refill Last Dose  . ibuprofen (ADVIL,MOTRIN) 200 MG tablet Take 600 mg by mouth every 6 (six) hours as needed for cramping.   08/08/2016 at Unknown time    Review of Systems  Constitutional: Negative for fever.  Gastrointestinal: Positive for abdominal pain. Negative for constipation, diarrhea, nausea and vomiting.  Genitourinary: Positive for vaginal bleeding. Negative for vaginal discharge.   Physical Exam   Blood pressure 118/77, pulse 105, temperature 98.4 F (36.9 C), temperature source Oral, resp. rate 18, height 5\' 7"  (1.702 m), weight 127 lb (57.6 kg), last menstrual period 06/16/2016, SpO2 100 %, unknown if currently breastfeeding.  Physical Exam  Nursing note and vitals reviewed. Constitutional: She is oriented to person, place, and time. She appears well-developed and well-nourished. No distress.  HENT:  Head: Normocephalic and atraumatic.  Cardiovascular: Normal rate.   Respiratory: Effort normal.  GI: Soft. She exhibits no distension and  no mass. There is no tenderness. There is no rebound and no guarding.  Genitourinary: Cervix exhibits no discharge and no friability. There is bleeding (small blood in vaginal vault with few small clots) in the vagina. No vaginal discharge found.  Neurological: She is alert and oriented to person, place, and time.  Skin: Skin is warm and dry. No erythema.  Psychiatric: She has a normal mood and affect.    Results for orders placed or performed during the hospital encounter of 08/08/16 (from the past 24 hour(s))  CBC     Status: Abnormal   Collection Time: 08/08/16  5:10 PM  Result Value Ref Range   WBC 7.8 4.0 - 10.5 K/uL   RBC 3.65 (L) 3.87 - 5.11 MIL/uL   Hemoglobin 9.1 (L) 12.0 - 15.0 g/dL   HCT 16.128.4 (L) 09.636.0 - 04.546.0 %   MCV 77.8 (L) 78.0 - 100.0 fL   MCH 24.9 (L) 26.0 - 34.0  pg   MCHC 32.0 30.0 - 36.0 g/dL   RDW 40.917.1 (H) 81.111.5 - 91.415.5 %   Platelets 289 150 - 400 K/uL   [place] Ob Comp Less 14 Wks  Result Date: 08/06/2016 CLINICAL DATA:  Vaginal bleeding and first trimester. EXAM: OBSTETRIC <14 WK US AND TRANSVAGINAL OB US TECHNIQUE: Both transabdominal and transvaginal ultrasound examinations were performed for complete evaluation of the gestation as well as the maternal uterus, adnexal regions, and pelvic cul-de-sac. Transvaginal technique was performed to assess early pregnancy. COMPARISON:  None. FINDINGS: Intrauterine gestational sac: None Yolk sac:  Not Visualized. Embryo:  Not Visualized. Cardiac Activity: Not Visualized. Maternal uterus/adnexae: Right ovary: Normal Left ovary: Normal Other :Bilateral hydrosalpinx identified. There is also complex echogenic material identified within the endometrial cavity. Free fluid: Complex fluid is identified within both adnexal regions. IMPRESSION: 1. No intrauterine gestational sac, yolk sac, or fetal pole identified. Differential considerations include intrauterine pregnancy too early to be sonographically visualized, missed abortion, or ectopic pregnancy. Followup ultrasound is recommended in 10-14 days for further evaluation. 2. Bilateral hydrosalpinx and complex free fluid identified within the pelvis and endometrial cavity. The appearance is nonspecific. Cannot rule out pelvic inflammatory disease. Careful clinical correlation is advised. Electronically Signed   By: Signa Kellaylor  Stroud M.D.   On: 08/06/2016 16:55   [place] Ob Transvaginal  Result Date: 08/06/2016 CLINICAL DATA:  Vaginal bleeding and first trimester. EXAM: OBSTETRIC <14 WK US AND TRANSVAGINAL OB US TECHNIQUE: Both transabdominal and transvaginal ultrasound examinations were performed for complete evaluation of the gestation as well as the maternal uterus, adnexal regions, and pelvic cul-de-sac. Transvaginal technique was performed to assess early pregnancy. COMPARISON:  None.  FINDINGS: Intrauterine gestational sac: None Yolk sac:  Not Visualized. Embryo:  Not Visualized. Cardiac Activity: Not Visualized. Maternal uterus/adnexae: Right ovary: Normal Left ovary: Normal Other :Bilateral hydrosalpinx identified. There is also complex echogenic material identified within the endometrial cavity. Free fluid: Complex fluid is identified within both adnexal regions. IMPRESSION: 1. No intrauterine gestational sac, yolk sac, or fetal pole identified. Differential considerations include intrauterine pregnancy too early to be sonographically visualized, missed abortion, or ectopic pregnancy. Followup ultrasound is recommended in 10-14 days for further evaluation. 2. Bilateral hydrosalpinx and complex free fluid identified within the pelvis and endometrial cavity. The appearance is nonspecific. Cannot rule out pelvic inflammatory disease. Careful clinical correlation is advised. Electronically Signed   By: Signa Kellaylor  Stroud M.D.   On: 08/06/2016 16:55     MAU Course  Procedures None  MDM HCG drawn in [facility]  this morning. Significant decline from last visit on 08/06/16.  Bleeding is stable on exam. CBC today.  New pad placed at 1715 - will re-assess bleeding after labs results 1 mg IM Dilaudid given in MAD Rx for Percocet #6 for home.  1735 - Labs pending. Care turned over to Jeani Sow, NP  19:05  Patient states she is in much less pain after injection.  She is waiting for her Mom to be discharged  Quarter size amount of blood on pad place at 17:15.   19:50  Patient's Mom is here and ready for discharge.  No increase in bleeding.  Patient is stable for discharge.    Rx for Percocet 5/325mg  # 6 tabs no refills for home Marny Lowenstein, PA-C  08/08/2016, 5:36 PM  Assessment and Plan  A:  First trimester spontaneous abortion  P:  F/U appointment scheduled in the clinic in 2 weeks       Return to MAU for heavy bleeding or increased pain.       Rx for Percocet for discharge       Bleeding  precautions reviewed with patient  Marny Lowenstein, PA-C and Elta Guadeloupe, NP    Attestation of Attending Supervision of Advanced Practice Provider (PA/CNM/NP): Evaluation and management procedures were performed by the Advanced Practice Provider under my supervision and collaboration.  I have reviewed the Advanced Practice Provider's note and chart, and I agree with the management and plan.    Jaynie Collins, MD, FACOG Attending Obstetrician & Gynecologist Faculty Practice, [facility]

## 2016-08-08 NOTE — MAU Note (Signed)
Arrives via EMS with complaint of miscarriage/vaginal bleeding.

## 2016-08-08 NOTE — Progress Notes (Signed)
Pt reports bleeding that is the same as when she was in MAU. She has period like cramps. [phone]. Morning ultrasound appointment in 6-8 weeks to follow up on hydrosalpinx. Ultrasound scheduled for 10/03/16 at 945 am. Pt to be informed of u/s and results this afternoon. Discussed patient with Dr. Ananda PennaErvin. Pt is in the process of an SAB. She should return in 2 weeks for repeat quant. Pt informed of both appointments. She will return on 3/7 at 1030 for labs. She had no questions or concerns.

## 2016-08-08 NOTE — Discharge Instructions (Signed)

## 2016-08-21 ENCOUNTER — Other Ambulatory Visit: Payer: Medicaid Other

## 2016-08-22 ENCOUNTER — Other Ambulatory Visit: Payer: Medicaid Other

## 2016-09-03 ENCOUNTER — Encounter: Payer: Self-pay | Admitting: *Deleted

## 2016-09-03 ENCOUNTER — Other Ambulatory Visit: Payer: Medicaid Other

## 2016-09-03 DIAGNOSIS — O3680X0 Pregnancy with inconclusive fetal viability, not applicable or unspecified: Secondary | ICD-10-CM

## 2016-09-04 LAB — BETA HCG QUANT (REF LAB): HCG QUANT: 15 m[IU]/mL

## 2016-09-05 ENCOUNTER — Telehealth: Payer: Self-pay | Admitting: *Deleted

## 2016-09-05 NOTE — Telephone Encounter (Addendum)
Called pt and left message stating that I am calling with test result information and recommendations. Please call back and state whether a detailed message can be left on her voice mail. Per Dr. Macon LargeAnyanwu, pt needs to be informed that her recent pregnancy hormone level has decreased as expected. She will need a recheck of this hormone level in 1-2 weeks to insure that it has decreased to the full non-pregnant level. If desired she may have visit with a provider on the same [place] to discuss contraception, future pregnancies, etc.   1420  Pt returned my call and I informed her of the above stated information. Pt voiced understanding and agreed to return on 3/28 @ 1100 for BHCG. Per chart review, I observed that pt is established for Gyn care with Dr. Ellyn HackBovard. I confirmed this information with pt and advised that she may follow up with Dr. Ellyn HackBovard for any contraception or future pregnancy concerns and questions. Pt voiced understanding of all information and instructions given.

## 2016-09-12 ENCOUNTER — Other Ambulatory Visit: Payer: Medicaid Other

## 2016-10-03 ENCOUNTER — Ambulatory Visit (HOSPITAL_COMMUNITY): Admission: RE | Admit: 2016-10-03 | Payer: Medicaid Other | Source: Ambulatory Visit

## 2016-10-03 ENCOUNTER — Encounter (HOSPITAL_COMMUNITY): Payer: Self-pay

## 2016-11-08 ENCOUNTER — Ambulatory Visit: Payer: Medicaid Other

## 2016-11-14 ENCOUNTER — Ambulatory Visit: Payer: Medicaid Other

## 2016-11-23 ENCOUNTER — Ambulatory Visit: Payer: Medicaid Other

## 2017-01-03 ENCOUNTER — Encounter: Payer: Self-pay | Admitting: Obstetrics & Gynecology

## 2017-01-03 ENCOUNTER — Encounter: Payer: Self-pay | Admitting: *Deleted

## 2017-01-03 ENCOUNTER — Ambulatory Visit (INDEPENDENT_AMBULATORY_CARE_PROVIDER_SITE_OTHER): Payer: Medicaid Other | Admitting: Obstetrics & Gynecology

## 2017-01-03 ENCOUNTER — Other Ambulatory Visit (HOSPITAL_COMMUNITY)
Admission: RE | Admit: 2017-01-03 | Discharge: 2017-01-03 | Disposition: A | Discharge status: Home / Self Care | Payer: Medicaid Other | Source: Ambulatory Visit | Attending: Obstetrics & Gynecology | Admitting: Obstetrics & Gynecology

## 2017-01-03 VITALS — BP 111/73 | HR 94 | Wt 133.0 lb

## 2017-01-03 DIAGNOSIS — N898 Other specified noninflammatory disorders of vagina: Secondary | ICD-10-CM | POA: Diagnosis not present

## 2017-01-03 DIAGNOSIS — O09299 Supervision of pregnancy with other poor reproductive or obstetric history, unspecified trimester: Secondary | ICD-10-CM

## 2017-01-03 DIAGNOSIS — O0932 Supervision of pregnancy with insufficient antenatal care, second trimester: Secondary | ICD-10-CM

## 2017-01-03 DIAGNOSIS — O26892 Other specified pregnancy related conditions, second trimester: Secondary | ICD-10-CM

## 2017-01-03 DIAGNOSIS — Z3A28 28 weeks gestation of pregnancy: Secondary | ICD-10-CM | POA: Insufficient documentation

## 2017-01-03 DIAGNOSIS — Z3482 Encounter for supervision of other normal pregnancy, second trimester: Secondary | ICD-10-CM

## 2017-01-03 DIAGNOSIS — O09292 Supervision of pregnancy with other poor reproductive or obstetric history, second trimester: Secondary | ICD-10-CM

## 2017-01-03 DIAGNOSIS — O26893 Other specified pregnancy related conditions, third trimester: Secondary | ICD-10-CM | POA: Insufficient documentation

## 2017-01-03 DIAGNOSIS — D649 Anemia, unspecified: Secondary | ICD-10-CM | POA: Insufficient documentation

## 2017-01-03 DIAGNOSIS — Z3689 Encounter for other specified antenatal screening: Secondary | ICD-10-CM

## 2017-01-03 DIAGNOSIS — O99013 Anemia complicating pregnancy, third trimester: Secondary | ICD-10-CM

## 2017-01-03 DIAGNOSIS — O0933 Supervision of pregnancy with insufficient antenatal care, third trimester: Secondary | ICD-10-CM | POA: Insufficient documentation

## 2017-01-03 DIAGNOSIS — Z3483 Encounter for supervision of other normal pregnancy, third trimester: Secondary | ICD-10-CM | POA: Insufficient documentation

## 2017-01-03 MED ORDER — FLUCONAZOLE 150 MG PO TABS: 150.0000 mg | ORAL_TABLET | Freq: Once | ORAL | 3 refills | Status: AC

## 2017-01-03 MED ORDER — ASPIRIN EC 81 MG PO TBEC: 81.0000 mg | DELAYED_RELEASE_TABLET | Freq: Every day | ORAL | 2 refills | Status: DC

## 2017-01-03 MED ORDER — METRONIDAZOLE 500 MG PO TABS: 500.0000 mg | ORAL_TABLET | Freq: Two times a day (BID) | ORAL | 0 refills | Status: DC

## 2017-01-03 NOTE — Progress Notes (Signed)
Subjective:   Amy Mclean is a 26 y.o. [phone] at [redacted]w[redacted]d by LMP (~24 weeks by best clinical estimate) being seen today for her first obstetrical visit.  Her obstetrical history is significant for severe psotpartum preeclampsia last pregnancy; late prenatal care this pregnancy. Patient does not intend to breast feed. Pregnancy history fully reviewed.  Patient reports abnormal vaginal discharge; was recently treated for BV with Metrogel last week by PCP.    HISTORY: Obstetric History   G8   P3   T3   P0   A4   L3    SAB2   TAB2   Ectopic0   Multiple0   Live Births3     # Outcome Date GA Lbr Len/2nd Weight Sex Delivery Anes PTL Lv  8 Current           7 TAB 08/2015     TAB     6 Term 08/23/14 [redacted]w[redacted]d 05:15 / 00:34 8 lb (3.629 kg) F Vag-Spont EPI  LIV     Apgar1:  8                Apgar5: 9  5 TAB           4 SAB           3 SAB           2 Term      Vag-Spont   LIV  1 Term      Vag-Spont   LIV     Past Medical History:  Diagnosis Date  . Adnexal mass 12/30/2015  . Anemia   . Chlamydia   . High blood pressure   . History of severe postpartum preeclampsia in prior pregnancy, currently pregnant 09/01/2014  . Hx of varicella   . Trichomonas vaginitis 2017  . Urinary tract infection    Past Surgical History:  Procedure Laterality Date  . LAPAROSCOPY N/A 12/30/2015   Procedure: LAPAROSCOPY DIAGNOSTIC;  Surgeon: Sherian Rein, MD;  Location: [facility];  Service: Gynecology;  Laterality: N/A;   Family History  Problem Relation Age of Onset  . Hypertension Father   . Hypertension Maternal Grandmother   . Alcohol abuse Neg Hx   . Arthritis Neg Hx   . Birth defects Neg Hx   . Asthma Neg Hx   . Cancer Neg Hx   . COPD Neg Hx   . Depression Neg Hx   . Diabetes Neg Hx   . Drug abuse Neg Hx   . Early death Neg Hx   . Hearing loss Neg Hx   . Heart disease Neg Hx   . Hyperlipidemia Neg Hx   . Kidney disease Neg Hx   . Learning disabilities Neg Hx   . Mental illness Neg  Hx   . Mental retardation Neg Hx   . Miscarriages / Stillbirths Neg Hx   . Vision loss Neg Hx   . Stroke Neg Hx    Social History  Substance Use Topics  . Smoking status: Never Smoker  . Smokeless tobacco: Never Used  . Alcohol use No   Allergies  Allergen Reactions  . No Known Allergies    Current Outpatient Prescriptions on File Prior to Visit  Medication Sig Dispense Refill  . ibuprofen (ADVIL,MOTRIN) 200 MG tablet Take 600 mg by mouth every 6 (six) hours as needed for cramping.    Marland Kitchen oxyCODONE-acetaminophen (ROXICET) 5-325 MG tablet Take 1 tablet by mouth every 6 (six) hours as needed for  severe pain. (Patient not taking: Reported on 01/03/2017) 6 tablet 0   No current facility-administered medications on file prior to visit.      Exam   Vitals:   01/03/17 1324  BP: 111/73  Pulse: 94  Weight: 133 lb (60.3 kg)   Fetal Heart Rate (bpm): 138  Uterus:  Fundal Height: 24 cm  Pelvic Exam: Perineum: no hemorrhoids, normal perineum   Vulva: normal external genitalia, no lesions   Vagina:  normal mucosa, thick, yellow, very foul-smelling discharge seen. Testing sample obtained.   Cervix: no lesions and normal, long/thick/closed    Adnexa: normal adnexa and no mass, fullness, tenderness   Bony Pelvis: average  System: General: well-developed, well-nourished female in no acute distress   Breast:  normal appearance, no masses or tenderness   Skin: normal coloration and turgor, no rashes   Neurologic: oriented, normal, negative, normal mood   Extremities: normal strength, tone, and muscle mass, ROM of all joints is normal   HEENT PERRLA, extraocular movement intact and sclera clear, anicteric   Mouth/Teeth mucous membranes moist, pharynx normal without lesions and dental hygiene good   Neck supple and no masses   Cardiovascular: regular rate and rhythm   Respiratory:  no respiratory distress, normal breath sounds   Abdomen: soft, non-tender; bowel sounds normal; no masses,   no organomegaly     Assessment:   Pregnancy:  [id number] Patient Active Problem List   Diagnosis Date Noted  . Late prenatal care affecting pregnancy in third trimester 01/03/2017  . Supervision of normal intrauterine pregnancy in multigravida in third trimester 01/03/2017  . Anemia 01/03/2017  . History of severe postpartum preeclampsia in prior pregnancy, currently pregnant 09/01/2014     Plan:  1. Late prenatal care affecting pregnancy in third trimester No sentinel events as per patient.    2. Vaginal discharge during pregnancy in third trimester Likely BV and possible co-pathogen.  Medications given, will follow up results and manage accordingly. - Cervicovaginal ancillary only - fluconazole (DIFLUCAN) 150 MG tablet; Take 1 tablet (150 mg total) by mouth once. Can take additional dose three days later if symptoms persist  Dispense: 1 tablet; Refill: 3 - metroNIDAZOLE (FLAGYL) 500 MG tablet; Take 1 tablet (500 mg total) by mouth 2 (two) times daily.  Dispense: 14 tablet; Refill: 0  3. History of severe postpartum preeclampsia in prior pregnancy, currently pregnant Aspiring prescribed. Normal BP today.   - aspirin EC 81 MG tablet; Take 1 tablet (81 mg total) by mouth daily. Take after 12 weeks for prevention of preeclampsia later in pregnancy  Dispense: 300 tablet; Refill: 2  4. Encounter for fetal anatomic survey Anatomy scan ordered - US MFM OB DETAIL +14 WK; Future  5. Supervision of normal intrauterine pregnancy in multigravida in third trimester - Obstetric Panel, Including HIV - Cystic Fibrosis Mutation 97 - Hemoglobinopathy evaluation - Culture, OB Urine - SMN1 Copy Number Analysis - US MFM OB DETAIL +14 WK; Future  Continue prenatal vitamins. Problem list reviewed and updated. The nature of [facility] with multiple MDs and other Advanced Practice Providers was explained to patient; also emphasized that residents, students are part  of our team. Routine obstetric precautions reviewed. Return in about 2 weeks (around 01/17/2017) for 2 hr GTT, OB visit, TDap.     Jaynie CollinsUGONNA  Amberley Hamler, MD, FACOG Attending Obstetrician & Gynecologist, [facility], [facility]

## 2017-01-03 NOTE — Patient Instructions (Signed)
Third Trimester of Pregnancy The third trimester is from week 28 through week 40 (months 7 through 9). The third trimester is a time when the unborn baby (fetus) is growing rapidly. At the end of the ninth month, the fetus is about 20 inches in length and weighs 6-10 pounds. Body changes during your third trimester Your body will continue to go through many changes during pregnancy. The changes vary from woman to woman. During the third trimester:  Your weight will continue to increase. You can expect to gain 25-35 pounds (11-16 kg) by the end of the pregnancy.  You may begin to get stretch marks on your hips, abdomen, and breasts.  You may urinate more often because the fetus is moving lower into your pelvis and pressing on your bladder.  You may develop or continue to have heartburn. This is caused by increased hormones that slow down muscles in the digestive tract.  You may develop or continue to have constipation because increased hormones slow digestion and cause the muscles that push waste through your intestines to relax.  You may develop hemorrhoids. These are swollen veins (varicose veins) in the rectum that can itch or be painful.  You may develop swollen, bulging veins (varicose veins) in your legs.  You may have increased body aches in the pelvis, back, or thighs. This is due to weight gain and increased hormones that are relaxing your joints.  You may have changes in your hair. These can include thickening of your hair, rapid growth, and changes in texture. Some women also have hair loss during or after pregnancy, or hair that feels dry or thin. Your hair will most likely return to normal after your baby is born.  Your breasts will continue to grow and they will continue to become tender. A yellow fluid (colostrum) may leak from your breasts. This is the first milk you are producing for your baby.  Your belly button may stick out.  You may notice more swelling in your hands,  face, or ankles.  You may have increased tingling or numbness in your hands, arms, and legs. The skin on your belly may also feel numb.  You may feel short of breath because of your expanding uterus.  You may have more problems sleeping. This can be caused by the size of your belly, increased need to urinate, and an increase in your body's metabolism.  You may notice the fetus "dropping," or moving lower in your abdomen (lightening).  You may have increased vaginal discharge.  You may notice your joints feel loose and you may have pain around your pelvic bone.  What to expect at prenatal visits You will have prenatal exams every 2 weeks until week 36. Then you will have weekly prenatal exams. During a routine prenatal visit:  You will be weighed to make sure you and the baby are growing normally.  Your blood pressure will be taken.  Your abdomen will be measured to track your baby's growth.  The fetal heartbeat will be listened to.  Any test results from the previous visit will be discussed.  You may have a cervical check near your due date to see if your cervix has softened or thinned (effaced).  You will be tested for Group B streptococcus. This happens between 35 and 37 weeks.  Your health care provider may ask you:  What your birth plan is.  How you are feeling.  If you are feeling the baby move.  If you have had   any abnormal symptoms, such as leaking fluid, bleeding, severe headaches, or abdominal cramping.  If you are using any tobacco products, including cigarettes, chewing tobacco, and electronic cigarettes.  If you have any questions.  Other tests or screenings that may be performed during your third trimester include:  Blood tests that check for low iron levels (anemia).  Fetal testing to check the health, activity level, and growth of the fetus. Testing is done if you have certain medical conditions or if there are problems during the  pregnancy.  Nonstress test (NST). This test checks the health of your baby to make sure there are no signs of problems, such as the baby not getting enough oxygen. During this test, a belt is placed around your belly. The baby is made to move, and its heart rate is monitored during movement.  What is false labor? False labor is a condition in which you feel small, irregular tightenings of the muscles in the womb (contractions) that usually go away with rest, changing position, or drinking water. These are called Braxton Hicks contractions. Contractions may last for hours, days, or even weeks before true labor sets in. If contractions come at regular intervals, become more frequent, increase in intensity, or become painful, you should see your health care provider. What are the signs of labor?  Abdominal cramps.  Regular contractions that start at 10 minutes apart and become stronger and more frequent with time.  Contractions that start on the top of the uterus and spread down to the lower abdomen and back.  Increased pelvic pressure and dull back pain.  A watery or bloody mucus discharge that comes from the vagina.  Leaking of amniotic fluid. This is also known as your "water breaking." It could be a slow trickle or a gush. Let your health care provider know if it has a color or strange odor. If you have any of these signs, call your health care provider right away, even if it is before your due date. Follow these instructions at home: Medicines  Follow your health care provider's instructions regarding medicine use. Specific medicines may be either safe or unsafe to take during pregnancy.  Take a prenatal vitamin that contains at least 600 micrograms (mcg) of folic acid.  If you develop constipation, try taking a stool softener if your health care provider approves. Eating and drinking  Eat a balanced diet that includes fresh fruits and vegetables, whole grains, good sources of protein  such as meat, eggs, or tofu, and low-fat dairy. Your health care provider will help you determine the amount of weight gain that is right for you.  Avoid raw meat and uncooked cheese. These carry germs that can cause birth defects in the baby.  If you have low calcium intake from food, talk to your health care provider about whether you should take a daily calcium supplement.  Eat four or five small meals rather than three large meals a day.  Limit foods that are high in fat and processed sugars, such as fried and sweet foods.  To prevent constipation: ? Drink enough fluid to keep your urine clear or pale yellow. ? Eat foods that are high in fiber, such as fresh fruits and vegetables, whole grains, and beans. Activity  Exercise only as directed by your health care provider. Most women can continue their usual exercise routine during pregnancy. Try to exercise for 30 minutes at least 5 days a week. Stop exercising if you experience uterine contractions.  Avoid heavy   lifting.  Do not exercise in extreme heat or humidity, or at high altitudes.  Wear low-heel, comfortable shoes.  Practice good posture.  You may continue to have sex unless your health care provider tells you otherwise. Relieving pain and discomfort  Take frequent breaks and rest with your legs elevated if you have leg cramps or low back pain.  Take warm sitz baths to soothe any pain or discomfort caused by hemorrhoids. Use hemorrhoid cream if your health care provider approves.  Wear a good support bra to prevent discomfort from breast tenderness.  If you develop varicose veins: ? Wear support pantyhose or compression stockings as told by your healthcare provider. ? Elevate your feet for 15 minutes, 3-4 times a day. Prenatal care  Write down your questions. Take them to your prenatal visits.  Keep all your prenatal visits as told by your health care provider. This is important. Safety  Wear your seat belt at  all times when driving.  Make a list of emergency phone numbers, including numbers for family, friends, the hospital, and police and fire departments. General instructions  Avoid cat litter boxes and soil used by cats. These carry germs that can cause birth defects in the baby. If you have a cat, ask someone to clean the litter box for you.  Do not travel far distances unless it is absolutely necessary and only with the approval of your health care provider.  Do not use hot tubs, steam rooms, or saunas.  Do not drink alcohol.  Do not use any products that contain nicotine or tobacco, such as cigarettes and e-cigarettes. If you need help quitting, ask your health care provider.  Do not use any medicinal herbs or unprescribed drugs. These chemicals affect the formation and growth of the baby.  Do not douche or use tampons or scented sanitary pads.  Do not cross your legs for long periods of time.  To prepare for the arrival of your baby: ? Take prenatal classes to understand, practice, and ask questions about labor and delivery. ? Make a trial run to the hospital. ? Visit the hospital and tour the maternity area. ? Arrange for maternity or paternity leave through employers. ? Arrange for family and friends to take care of pets while you are in the hospital. ? Purchase a rear-facing car seat and make sure you know how to install it in your car. ? Pack your hospital bag. ? Prepare the baby's nursery. Make sure to remove all pillows and stuffed animals from the baby's crib to prevent suffocation.  Visit your dentist if you have not gone during your pregnancy. Use a soft toothbrush to brush your teeth and be gentle when you floss. Contact a health care provider if:  You are unsure if you are in labor or if your water has broken.  You become dizzy.  You have mild pelvic cramps, pelvic pressure, or nagging pain in your abdominal area.  You have lower back pain.  You have persistent  nausea, vomiting, or diarrhea.  You have an unusual or bad smelling vaginal discharge.  You have pain when you urinate. Get help right away if:  Your water breaks before 37 weeks.  You have regular contractions less than 5 minutes apart before 37 weeks.  You have a fever.  You are leaking fluid from your vagina.  You have spotting or bleeding from your vagina.  You have severe abdominal pain or cramping.  You have rapid weight loss or weight gain.    You have shortness of breath with chest pain.  You notice sudden or extreme swelling of your face, hands, ankles, feet, or legs.  Your baby makes fewer than 10 movements in 2 hours.  You have severe headaches that do not go away when you take medicine.  You have vision changes. Summary  The third trimester is from week 28 through week 40, months 7 through 9. The third trimester is a time when the unborn baby (fetus) is growing rapidly.  During the third trimester, your discomfort may increase as you and your baby continue to gain weight. You may have abdominal, leg, and back pain, sleeping problems, and an increased need to urinate.  During the third trimester your breasts will keep growing and they will continue to become tender. A yellow fluid (colostrum) may leak from your breasts. This is the first milk you are producing for your baby.  False labor is a condition in which you feel small, irregular tightenings of the muscles in the womb (contractions) that eventually go away. These are called Braxton Hicks contractions. Contractions may last for hours, days, or even weeks before true labor sets in.  Signs of labor can include: abdominal cramps; regular contractions that start at 10 minutes apart and become stronger and more frequent with time; watery or bloody mucus discharge that comes from the vagina; increased pelvic pressure and dull back pain; and leaking of amniotic fluid. This information is not intended to replace advice  given to you by your health care provider. Make sure you discuss any questions you have with your health care provider. Document Released: 05/29/2001 Document Revised: 11/10/2015 Document Reviewed: 08/05/2012 Elsevier Interactive Patient Education  2017 [employer]  

## 2017-01-04 ENCOUNTER — Encounter: Payer: Self-pay | Admitting: Obstetrics & Gynecology

## 2017-01-04 DIAGNOSIS — O23593 Infection of other part of genital tract in pregnancy, third trimester: Secondary | ICD-10-CM

## 2017-01-04 DIAGNOSIS — A5901 Trichomonal vulvovaginitis: Secondary | ICD-10-CM | POA: Insufficient documentation

## 2017-01-04 LAB — CERVICOVAGINAL ANCILLARY ONLY
Bacterial vaginitis: NEGATIVE
CHLAMYDIA, DNA PROBE: NEGATIVE
Candida vaginitis: NEGATIVE
NEISSERIA GONORRHEA: NEGATIVE
Trichomonas: POSITIVE — AB

## 2017-01-05 LAB — HEMOGLOBINOPATHY EVALUATION
HGB A: 97.5 % (ref 96.4–98.8)
HGB C: 0 %
HGB S: 0 %
HGB VARIANT: 0 %
Hemoglobin A2 Quantitation: 2.5 % (ref 1.8–3.2)
Hemoglobin F Quantitation: 0 % (ref 0.0–2.0)

## 2017-01-05 LAB — OBSTETRIC PANEL, INCLUDING HIV
Antibody Screen: NEGATIVE
BASOS ABS: 0 10*3/uL (ref 0.0–0.2)
Basos: 0 %
EOS (ABSOLUTE): 0.1 10*3/uL (ref 0.0–0.4)
EOS: 1 %
HEMOGLOBIN: 8.8 g/dL — AB (ref 11.1–15.9)
HEP B S AG: NEGATIVE
HIV Screen 4th Generation wRfx: NONREACTIVE
Hematocrit: 28.8 % — ABNORMAL LOW (ref 34.0–46.6)
IMMATURE GRANS (ABS): 0 10*3/uL (ref 0.0–0.1)
IMMATURE GRANULOCYTES: 0 %
LYMPHS: 17 %
Lymphocytes Absolute: 1.2 10*3/uL (ref 0.7–3.1)
MCH: 24 pg — ABNORMAL LOW (ref 26.6–33.0)
MCHC: 30.6 g/dL — ABNORMAL LOW (ref 31.5–35.7)
MCV: 79 fL (ref 79–97)
MONOCYTES: 4 %
Monocytes Absolute: 0.3 10*3/uL (ref 0.1–0.9)
NEUTROS PCT: 78 %
Neutrophils Absolute: 5.4 10*3/uL (ref 1.4–7.0)
Platelets: 305 10*3/uL (ref 150–379)
RBC: 3.66 x10E6/uL — ABNORMAL LOW (ref 3.77–5.28)
RDW: 16 % — ABNORMAL HIGH (ref 12.3–15.4)
RH TYPE: POSITIVE
RPR: NONREACTIVE
RUBELLA: 1.82 {index} (ref >0.99)
WBC: 6.9 10*3/uL (ref 3.4–10.8)

## 2017-01-08 DIAGNOSIS — O99013 Anemia complicating pregnancy, third trimester: Secondary | ICD-10-CM | POA: Insufficient documentation

## 2017-01-08 MED ORDER — FERROUS SULFATE 325 (65 FE) MG PO TABS: 325.0000 mg | ORAL_TABLET | Freq: Two times a day (BID) | ORAL | 1 refills | Status: DC

## 2017-01-08 NOTE — Addendum Note (Signed)
Addended by: Jaynie CollinsANYANWU, UGONNA A on: 01/08/2017 11:10 AM   Modules accepted: Orders

## 2017-01-09 ENCOUNTER — Other Ambulatory Visit: Payer: Self-pay | Admitting: Obstetrics & Gynecology

## 2017-01-09 ENCOUNTER — Ambulatory Visit (HOSPITAL_COMMUNITY)
Admission: RE | Admit: 2017-01-09 | Discharge: 2017-01-09 | Disposition: A | Discharge status: Home / Self Care | Payer: Medicaid Other | Source: Ambulatory Visit | Attending: Obstetrics & Gynecology | Admitting: Obstetrics & Gynecology

## 2017-01-09 DIAGNOSIS — Z3A22 22 weeks gestation of pregnancy: Secondary | ICD-10-CM | POA: Insufficient documentation

## 2017-01-09 DIAGNOSIS — O09299 Supervision of pregnancy with other poor reproductive or obstetric history, unspecified trimester: Secondary | ICD-10-CM

## 2017-01-09 DIAGNOSIS — Z3687 Encounter for antenatal screening for uncertain dates: Secondary | ICD-10-CM

## 2017-01-09 DIAGNOSIS — O09292 Supervision of pregnancy with other poor reproductive or obstetric history, second trimester: Secondary | ICD-10-CM | POA: Diagnosis present

## 2017-01-09 DIAGNOSIS — O0932 Supervision of pregnancy with insufficient antenatal care, second trimester: Secondary | ICD-10-CM | POA: Diagnosis not present

## 2017-01-09 DIAGNOSIS — Z3689 Encounter for other specified antenatal screening: Secondary | ICD-10-CM | POA: Diagnosis not present

## 2017-01-09 DIAGNOSIS — Z3482 Encounter for supervision of other normal pregnancy, second trimester: Secondary | ICD-10-CM

## 2017-01-09 LAB — CULTURE, OB URINE

## 2017-01-09 LAB — URINE CULTURE, OB REFLEX

## 2017-01-10 ENCOUNTER — Telehealth: Payer: Self-pay

## 2017-01-10 LAB — CYSTIC FIBROSIS MUTATION 97: GENE DIS ANAL CARRIER INTERP BLD/T-IMP: NOT DETECTED

## 2017-01-10 NOTE — Telephone Encounter (Signed)
Notified pt of results and medications that were sent by provider.

## 2017-01-10 NOTE — Progress Notes (Signed)
Ultrasound report reviewed; EDD changed accordingly.

## 2017-01-14 LAB — SMN1 COPY NUMBER ANALYSIS (SMA CARRIER SCREENING)

## 2017-01-17 ENCOUNTER — Encounter: Payer: Medicaid Other | Admitting: Obstetrics and Gynecology

## 2017-01-17 ENCOUNTER — Other Ambulatory Visit: Payer: Medicaid Other

## 2017-01-31 ENCOUNTER — Encounter: Payer: Self-pay | Admitting: *Deleted

## 2017-01-31 ENCOUNTER — Ambulatory Visit (INDEPENDENT_AMBULATORY_CARE_PROVIDER_SITE_OTHER): Payer: Medicaid Other | Admitting: Obstetrics and Gynecology

## 2017-01-31 VITALS — BP 117/77 | HR 105 | Wt 138.4 lb

## 2017-01-31 DIAGNOSIS — O99013 Anemia complicating pregnancy, third trimester: Secondary | ICD-10-CM

## 2017-01-31 DIAGNOSIS — Z3483 Encounter for supervision of other normal pregnancy, third trimester: Secondary | ICD-10-CM

## 2017-01-31 MED ORDER — VITAFOL GUMMIES 3.33-0.333-34.8 MG PO CHEW: 2.0000 | CHEWABLE_TABLET | Freq: Every day | ORAL | 6 refills | Status: AC

## 2017-01-31 NOTE — Progress Notes (Signed)
   PRENATAL VISIT NOTE  Subjective:  Amy Mclean is a 26 y.o. [phone] at 4345w1d being seen today for ongoing prenatal care.  She is currently monitored for the following issues for this low-risk pregnancy and has History of severe postpartum preeclampsia in prior pregnancy, currently pregnant; Late prenatal care affecting pregnancy in third trimester; Supervision of normal intrauterine pregnancy in multigravida in third trimester; Anemia; Trichomonal vaginitis during pregnancy in third trimester; and Anemia during pregnancy in third trimester on her problem list.  Patient reports no complaints.  Contractions: Not present. Vag. Bleeding: None.  Movement: Present. Denies leaking of fluid.   The following portions of the patient's history were reviewed and updated as appropriate: allergies, current medications, past family history, past medical history, past social history, past surgical history and problem list. Problem list updated.  Objective:   Vitals:   01/31/17 1407  BP: 117/77  Pulse: (!) 105  Weight: 138 lb 6.4 oz (62.8 kg)    Fetal Status: Fetal Heart Rate (bpm): 147 Fundal Height: 24 cm Movement: Present     General:  Alert, oriented and cooperative. Patient is in no acute distress.  Skin: Skin is warm and dry. No rash noted.   Cardiovascular: Normal heart rate noted  Respiratory: Normal respiratory effort, no problems with respiration noted  Abdomen: Soft, gravid, appropriate for gestational age.  Pain/Pressure: Absent     Pelvic: Cervical exam deferred        Extremities: Normal range of motion.  Edema: None  Mental Status:  Normal mood and affect. Normal behavior. Normal judgment and thought content.   Assessment and Plan:  Pregnancy: [id number] at 6245w1d  1. Supervision of normal intrauterine pregnancy in multigravida in third trimester Patient is doing well Third trimester labs next visit Normal anatomy result reviewed Patient signed BTL consent today  2. Anemia  during pregnancy in third trimester Continue iron supplementation   Preterm labor symptoms and general obstetric precautions including but not limited to vaginal bleeding, contractions, leaking of fluid and fetal movement were reviewed in detail with the patient. Please refer to After Visit Summary for other counseling recommendations.  Return in about 4 weeks (around 02/28/2017) for ROB, 2 hr glucola next visit.   Catalina AntiguaPeggy Clevon Khader, MD

## 2017-02-05 ENCOUNTER — Other Ambulatory Visit: Payer: Self-pay | Admitting: Obstetrics and Gynecology

## 2017-02-05 ENCOUNTER — Telehealth: Payer: Self-pay

## 2017-02-05 MED ORDER — METRONIDAZOLE 0.75 % VA GEL: 1.0000 | Freq: Every day | VAGINAL | 1 refills | Status: DC

## 2017-02-05 NOTE — Telephone Encounter (Signed)
Patient notified of Rx change and advised to activate her MyChart to send questions to the doctor.

## 2017-02-05 NOTE — Telephone Encounter (Signed)
-----   Message from Catalina Antigua, MD sent at 02/05/2017  4:31 PM EDT ----- Regarding: RE: medication change Rx for metrogel provided  Patient can send questions via my chart  Thanks   peggy  ----- Message ----- From: Maretta Bees, RMA Sent: 02/05/2017  10:49 AM To: Catalina Antigua, MD Subject: medication change                              Ms. Labor is requesting that you change her Flagyl as it is making her sick, she also wants to know if you could call her please?  Please advice.

## 2017-02-27 ENCOUNTER — Ambulatory Visit (INDEPENDENT_AMBULATORY_CARE_PROVIDER_SITE_OTHER): Payer: Medicaid Other | Admitting: Obstetrics & Gynecology

## 2017-02-27 ENCOUNTER — Other Ambulatory Visit: Payer: Medicaid Other

## 2017-02-27 ENCOUNTER — Encounter: Payer: Self-pay | Admitting: *Deleted

## 2017-02-27 DIAGNOSIS — Z3483 Encounter for supervision of other normal pregnancy, third trimester: Secondary | ICD-10-CM

## 2017-02-27 DIAGNOSIS — O0933 Supervision of pregnancy with insufficient antenatal care, third trimester: Secondary | ICD-10-CM

## 2017-02-27 NOTE — Progress Notes (Signed)
   PRENATAL VISIT NOTE  Subjective:  Amy Mclean is a 26 y.o. [id number] at 2482w0d being seen today for ongoing prenatal care.  She is currently monitored for the following issues for this high-risk pregnancy and has History of severe postpartum preeclampsia in prior pregnancy, currently pregnant; Late prenatal care affecting pregnancy in third trimester; Supervision of normal intrauterine pregnancy in multigravida in third trimester; Anemia; Trichomonal vaginitis during pregnancy in third trimester; and Anemia during pregnancy in third trimester on her problem list.  Patient reports no complaints.  Contractions: Not present. Vag. Bleeding: None.  Movement: Present. Denies leaking of fluid.   The following portions of the patient's history were reviewed and updated as appropriate: allergies, current medications, past family history, past medical history, past social history, past surgical history and problem list. Problem list updated.  Objective:   Vitals:   02/27/17 0902  BP: 105/74  Pulse: 94  Weight: 63 kg (139 lb)    Fetal Status: Fetal Heart Rate (bpm): 151   Movement: Present     General:  Alert, oriented and cooperative. Patient is in no acute distress.  Skin: Skin is warm and dry. No rash noted.   Cardiovascular: Normal heart rate noted  Respiratory: Normal respiratory effort, no problems with respiration noted  Abdomen: Soft, gravid, appropriate for gestational age.  Pain/Pressure: Present     Pelvic: Cervical exam deferred        Extremities: Normal range of motion.  Edema: Trace  Mental Status:  Normal mood and affect. Normal behavior. Normal judgment and thought content.   Assessment and Plan:  Pregnancy: [id number] at 582w0d  1. Supervision of normal intrauterine pregnancy in multigravida in third trimester Routine testing today  2. Late prenatal care affecting pregnancy in third trimester 21 weeks first visit  Preterm labor symptoms and general obstetric  precautions including but not limited to vaginal bleeding, contractions, leaking of fluid and fetal movement were reviewed in detail with the patient. Please refer to After Visit Summary for other counseling recommendations.  Return in about 2 weeks (around 03/13/2017).   Scheryl DarterJames Arnold, MD

## 2017-02-27 NOTE — Patient Instructions (Signed)

## 2017-02-27 NOTE — Progress Notes (Signed)
ROB/GTT. Patient declined the FLU and TDAP Vaccines.

## 2017-02-28 ENCOUNTER — Telehealth: Payer: Self-pay | Admitting: *Deleted

## 2017-02-28 LAB — HIV ANTIBODY (ROUTINE TESTING W REFLEX): HIV SCREEN 4TH GENERATION: NONREACTIVE

## 2017-02-28 LAB — CBC
Hematocrit: 28 % — ABNORMAL LOW (ref 34.0–46.6)
Hemoglobin: 8.7 g/dL — ABNORMAL LOW (ref 11.1–15.9)
MCH: 25.4 pg — AB (ref 26.6–33.0)
MCHC: 31.1 g/dL — AB (ref 31.5–35.7)
MCV: 82 fL (ref 79–97)
Platelets: 254 10*3/uL (ref 150–379)
RBC: 3.43 x10E6/uL — ABNORMAL LOW (ref 3.77–5.28)
RDW: 18.8 % — AB (ref 12.3–15.4)
WBC: 6.7 10*3/uL (ref 3.4–10.8)

## 2017-02-28 LAB — GLUCOSE TOLERANCE, 2 HOURS W/ 1HR
GLUCOSE, 1 HOUR: 115 mg/dL (ref 65–179)
Glucose, 2 hour: 83 mg/dL (ref 65–152)
Glucose, Fasting: 69 mg/dL (ref 65–91)

## 2017-02-28 LAB — RPR: RPR: NONREACTIVE

## 2017-02-28 NOTE — Telephone Encounter (Signed)
Pt returned call to office, had to leave msg.  Attempt to return call. LM on VM to call office.

## 2017-02-28 NOTE — Telephone Encounter (Signed)
Pt called to office LM stating she has questions about STD tx.  Attempt to return call.  LM on VM to call office.

## 2017-03-13 ENCOUNTER — Ambulatory Visit (INDEPENDENT_AMBULATORY_CARE_PROVIDER_SITE_OTHER): Payer: Medicaid Other | Admitting: Certified Nurse Midwife

## 2017-03-13 ENCOUNTER — Encounter: Payer: Self-pay | Admitting: Obstetrics

## 2017-03-13 VITALS — BP 110/74 | HR 93 | Wt 142.5 lb

## 2017-03-13 DIAGNOSIS — O09299 Supervision of pregnancy with other poor reproductive or obstetric history, unspecified trimester: Secondary | ICD-10-CM

## 2017-03-13 DIAGNOSIS — O09293 Supervision of pregnancy with other poor reproductive or obstetric history, third trimester: Secondary | ICD-10-CM

## 2017-03-13 DIAGNOSIS — O99013 Anemia complicating pregnancy, third trimester: Secondary | ICD-10-CM

## 2017-03-13 DIAGNOSIS — Z3483 Encounter for supervision of other normal pregnancy, third trimester: Secondary | ICD-10-CM

## 2017-03-13 DIAGNOSIS — D649 Anemia, unspecified: Secondary | ICD-10-CM

## 2017-03-13 DIAGNOSIS — O0933 Supervision of pregnancy with insufficient antenatal care, third trimester: Secondary | ICD-10-CM

## 2017-03-13 MED ORDER — CITRANATAL BLOOM 90-1 MG PO TABS: 1.0000 | ORAL_TABLET | Freq: Every day | ORAL | 12 refills | Status: DC

## 2017-03-13 NOTE — Progress Notes (Signed)
   PRENATAL VISIT NOTE  Subjective:  Amy Mclean is a 26 y.o. [phone] at [redacted]w[redacted]d being seen today for ongoing prenatal care.  She is currently monitored for the following issues for this low-risk pregnancy and has History of severe postpartum preeclampsia in prior pregnancy, currently pregnant; Late prenatal care affecting pregnancy in third trimester; Supervision of normal intrauterine pregnancy in multigravida in third trimester; Anemia; Trichomonal vaginitis during pregnancy in third trimester; and Anemia during pregnancy in third trimester on her problem list.  Patient reports no complaints.  Contractions: Not present. Vag. Bleeding: None.  Movement: Present. Denies leaking of fluid.   The following portions of the patient's history were reviewed and updated as appropriate: allergies, current medications, past family history, past medical history, past social history, past surgical history and problem list. Problem list updated.  Objective:   Vitals:   03/13/17 1115  BP: 110/74  Pulse: 93  Weight: 142 lb 8 oz (64.6 kg)    Fetal Status: Fetal Heart Rate (bpm): 138-152; doppler Fundal Height: 30 cm Movement: Present     General:  Alert, oriented and cooperative. Patient is in no acute distress.  Skin: Skin is warm and dry. No rash noted.   Cardiovascular: Normal heart rate noted  Respiratory: Normal respiratory effort, no problems with respiration noted  Abdomen: Soft, gravid, appropriate for gestational age.  Pain/Pressure: Present     Pelvic: Cervical exam deferred        Extremities: Normal range of motion.  Edema: None  Mental Status:  Normal mood and affect. Normal behavior. Normal judgment and thought content.   Assessment and Plan:  Pregnancy: [id number] at [redacted]w[redacted]d  1. Supervision of normal intrauterine pregnancy in multigravida in third trimester     Doing well.  BTL paperwork completed.   2. Anemia during pregnancy in third trimester     Bloom sent  3. History of  severe postpartum preeclampsia in prior pregnancy, currently pregnant     Normotensive today.    4. Late prenatal care affecting pregnancy in third trimester      weeks.   Preterm labor symptoms and general obstetric precautions including but not limited to vaginal bleeding, contractions, leaking of fluid and fetal movement were reviewed in detail with the patient. Please refer to After Visit Summary for other counseling recommendations.  Return in about 2 weeks (around 03/27/2017) for ROB.   Roe Coombs, CNM

## 2017-03-13 NOTE — Progress Notes (Signed)
Patient reports good fetal movement with pressure, denies contractions. 

## 2017-03-21 ENCOUNTER — Other Ambulatory Visit: Payer: Self-pay | Admitting: Certified Nurse Midwife

## 2017-03-21 DIAGNOSIS — Z3483 Encounter for supervision of other normal pregnancy, third trimester: Secondary | ICD-10-CM

## 2017-03-27 ENCOUNTER — Encounter: Payer: Self-pay | Admitting: Certified Nurse Midwife

## 2017-03-27 ENCOUNTER — Ambulatory Visit (INDEPENDENT_AMBULATORY_CARE_PROVIDER_SITE_OTHER): Payer: Medicaid Other | Admitting: Certified Nurse Midwife

## 2017-03-27 ENCOUNTER — Other Ambulatory Visit (HOSPITAL_COMMUNITY)
Admission: RE | Admit: 2017-03-27 | Discharge: 2017-03-27 | Disposition: A | Discharge status: Home / Self Care | Payer: Medicaid Other | Source: Ambulatory Visit | Attending: Certified Nurse Midwife | Admitting: Certified Nurse Midwife

## 2017-03-27 ENCOUNTER — Encounter: Payer: Self-pay | Admitting: *Deleted

## 2017-03-27 VITALS — BP 110/72 | HR 91 | Wt 140.0 lb

## 2017-03-27 DIAGNOSIS — B3731 Acute candidiasis of vulva and vagina: Secondary | ICD-10-CM

## 2017-03-27 DIAGNOSIS — B373 Candidiasis of vulva and vagina: Secondary | ICD-10-CM

## 2017-03-27 DIAGNOSIS — A5901 Trichomonal vulvovaginitis: Secondary | ICD-10-CM

## 2017-03-27 DIAGNOSIS — O09293 Supervision of pregnancy with other poor reproductive or obstetric history, third trimester: Secondary | ICD-10-CM

## 2017-03-27 DIAGNOSIS — O98813 Other maternal infectious and parasitic diseases complicating pregnancy, third trimester: Secondary | ICD-10-CM | POA: Diagnosis not present

## 2017-03-27 DIAGNOSIS — N760 Acute vaginitis: Secondary | ICD-10-CM | POA: Diagnosis not present

## 2017-03-27 DIAGNOSIS — Z3A00 Weeks of gestation of pregnancy not specified: Secondary | ICD-10-CM | POA: Diagnosis not present

## 2017-03-27 DIAGNOSIS — O23593 Infection of other part of genital tract in pregnancy, third trimester: Secondary | ICD-10-CM

## 2017-03-27 DIAGNOSIS — B9689 Other specified bacterial agents as the cause of diseases classified elsewhere: Secondary | ICD-10-CM | POA: Diagnosis not present

## 2017-03-27 DIAGNOSIS — O0933 Supervision of pregnancy with insufficient antenatal care, third trimester: Secondary | ICD-10-CM

## 2017-03-27 DIAGNOSIS — O09299 Supervision of pregnancy with other poor reproductive or obstetric history, unspecified trimester: Secondary | ICD-10-CM

## 2017-03-27 DIAGNOSIS — Z3483 Encounter for supervision of other normal pregnancy, third trimester: Secondary | ICD-10-CM

## 2017-03-27 MED ORDER — TERCONAZOLE 0.8 % VA CREA: 1.0000 | TOPICAL_CREAM | Freq: Every day | VAGINAL | 0 refills | Status: DC

## 2017-03-27 MED ORDER — FLUCONAZOLE 150 MG PO TABS: 150.0000 mg | ORAL_TABLET | Freq: Once | ORAL | 0 refills | Status: AC

## 2017-03-27 MED ORDER — ASPIRIN EC 81 MG PO TBEC: 81.0000 mg | DELAYED_RELEASE_TABLET | Freq: Every day | ORAL | 2 refills | Status: DC

## 2017-03-27 NOTE — Progress Notes (Signed)
   PRENATAL VISIT NOTE  Subjective:  Amy Mclean is a 26 y.o. [phone] at [redacted]w[redacted]d being seen today for ongoing prenatal care.  She is currently monitored for the following issues for this low-risk pregnancy and has History of severe postpartum preeclampsia in prior pregnancy, currently pregnant; Late prenatal care affecting pregnancy in third trimester; Supervision of normal intrauterine pregnancy in multigravida in third trimester; Anemia; Trichomonal vaginitis during pregnancy in third trimester; and Anemia during pregnancy in third trimester on her problem list.  Patient reports no bleeding, no cramping, no leaking, occasional contractions, vaginal irritation and reports spotting on Saturday, and change in vaginal discharge with itching.  Contractions: Not present. Vag. Bleeding: None.  Movement: Present. Denies leaking of fluid.   The following portions of the patient's history were reviewed and updated as appropriate: allergies, current medications, past family history, past medical history, past social history, past surgical history and problem list. Problem list updated.  Objective:   Vitals:   03/27/17 1109  BP: 110/72  Pulse: 91  Weight: 140 lb (63.5 kg)    Fetal Status: Fetal Heart Rate (bpm): 145; doppler Fundal Height: 32 cm Movement: Present     General:  Alert, oriented and cooperative. Patient is in no acute distress.  Skin: Skin is warm and dry. No rash noted.   Cardiovascular: Normal heart rate noted  Respiratory: Normal respiratory effort, no problems with respiration noted  Abdomen: Soft, gravid, appropriate for gestational age.  Pain/Pressure: Present     Pelvic: Cervical exam performed Dilation: Closed Effacement (%): 50 Station: Ballotable  Extremities: Normal range of motion.  Edema: None  Mental Status:  Normal mood and affect. Normal behavior. Normal judgment and thought content.   Assessment and Plan:  Pregnancy: [id number] at [redacted]w[redacted]d  1. Supervision of normal  intrauterine pregnancy in multigravida in third trimester      - Cervicovaginal ancillary only  2. History of severe postpartum preeclampsia in prior pregnancy, currently pregnant     - aspirin EC 81 MG tablet; Take 1 tablet (81 mg total) by mouth daily. Take after 12 weeks for prevention of preeclampsia later in pregnancy  Dispense: 300 tablet; Refill: 2  3. Late prenatal care affecting pregnancy in third trimester     + wks.   4. Trichomonal vaginitis during pregnancy in third trimester      TOC today - Cervicovaginal ancillary only  5. Yeast vaginitis     - fluconazole (DIFLUCAN) 150 MG tablet; Take 1 tablet (150 mg total) by mouth once.  Dispense: 1 tablet; Refill: 0 - terconazole (TERAZOL 3) 0.8 % vaginal cream; Place 1 applicator vaginally at bedtime.  Dispense: 20 g; Refill: 0  Preterm labor symptoms and general obstetric precautions including but not limited to vaginal bleeding, contractions, leaking of fluid and fetal movement were reviewed in detail with the patient. Please refer to After Visit Summary for other counseling recommendations.  Return in about 2 weeks (around 04/10/2017) for ROB, GBS.   Roe Coombs, CNM

## 2017-03-28 LAB — CERVICOVAGINAL ANCILLARY ONLY
Bacterial vaginitis: NEGATIVE
Candida vaginitis: POSITIVE — AB
Chlamydia: NEGATIVE
Neisseria Gonorrhea: NEGATIVE
TRICH (WINDOWPATH): POSITIVE — AB

## 2017-04-01 ENCOUNTER — Telehealth: Payer: Self-pay

## 2017-04-01 ENCOUNTER — Other Ambulatory Visit: Payer: Self-pay | Admitting: Certified Nurse Midwife

## 2017-04-01 DIAGNOSIS — N760 Acute vaginitis: Secondary | ICD-10-CM

## 2017-04-01 DIAGNOSIS — B3731 Acute candidiasis of vulva and vagina: Secondary | ICD-10-CM

## 2017-04-01 DIAGNOSIS — B9689 Other specified bacterial agents as the cause of diseases classified elsewhere: Secondary | ICD-10-CM

## 2017-04-01 DIAGNOSIS — B373 Candidiasis of vulva and vagina: Secondary | ICD-10-CM

## 2017-04-01 MED ORDER — FLUCONAZOLE 150 MG PO TABS: 150.0000 mg | ORAL_TABLET | Freq: Once | ORAL | 0 refills | Status: AC

## 2017-04-01 MED ORDER — METRONIDAZOLE 500 MG PO TABS: 2000.0000 mg | ORAL_TABLET | Freq: Once | ORAL | 0 refills | Status: AC

## 2017-04-01 NOTE — Telephone Encounter (Signed)
-----   Message from Roe Coombs, CNM sent at 04/01/2017  8:43 AM EDT ----- Please call patient and notify of +Trich results. Metrondiazole 2000 mg PO.   Please schedule her a TOC with an ROB in 4-6 weeks.  Please tell her to have her partner treated as well.   Please let her know that she has a yeast infection.  Diflucan was sent to the pharmacy for her to use.  Thank you.   Thank you, Orvilla Cornwall CNM

## 2017-04-01 NOTE — Telephone Encounter (Signed)
Patient notified of results and RX, to abstain from sex, partner needs to be treated and TOC in 4-6 weeks.

## 2017-04-09 ENCOUNTER — Inpatient Hospital Stay (HOSPITAL_COMMUNITY)
Admission: AD | Admit: 2017-04-09 | Discharge: 2017-04-09 | Disposition: A | Discharge status: Home / Self Care | Payer: Medicaid Other | Source: Ambulatory Visit | Attending: Family Medicine | Admitting: Family Medicine

## 2017-04-09 ENCOUNTER — Encounter (HOSPITAL_COMMUNITY): Payer: Self-pay | Admitting: *Deleted

## 2017-04-09 ENCOUNTER — Telehealth: Payer: Self-pay

## 2017-04-09 DIAGNOSIS — O0933 Supervision of pregnancy with insufficient antenatal care, third trimester: Secondary | ICD-10-CM

## 2017-04-09 DIAGNOSIS — O99013 Anemia complicating pregnancy, third trimester: Secondary | ICD-10-CM | POA: Diagnosis not present

## 2017-04-09 DIAGNOSIS — O26893 Other specified pregnancy related conditions, third trimester: Secondary | ICD-10-CM

## 2017-04-09 DIAGNOSIS — K5900 Constipation, unspecified: Secondary | ICD-10-CM | POA: Diagnosis not present

## 2017-04-09 DIAGNOSIS — O4703 False labor before 37 completed weeks of gestation, third trimester: Secondary | ICD-10-CM

## 2017-04-09 DIAGNOSIS — D649 Anemia, unspecified: Secondary | ICD-10-CM | POA: Insufficient documentation

## 2017-04-09 DIAGNOSIS — F5089 Other specified eating disorder: Secondary | ICD-10-CM

## 2017-04-09 DIAGNOSIS — Z7982 Long term (current) use of aspirin: Secondary | ICD-10-CM | POA: Insufficient documentation

## 2017-04-09 DIAGNOSIS — A5901 Trichomonal vulvovaginitis: Secondary | ICD-10-CM

## 2017-04-09 DIAGNOSIS — O23593 Infection of other part of genital tract in pregnancy, third trimester: Secondary | ICD-10-CM

## 2017-04-09 DIAGNOSIS — O4700 False labor before 37 completed weeks of gestation, unspecified trimester: Secondary | ICD-10-CM

## 2017-04-09 DIAGNOSIS — O479 False labor, unspecified: Secondary | ICD-10-CM

## 2017-04-09 DIAGNOSIS — Z3A34 34 weeks gestation of pregnancy: Secondary | ICD-10-CM | POA: Diagnosis not present

## 2017-04-09 DIAGNOSIS — Z3483 Encounter for supervision of other normal pregnancy, third trimester: Secondary | ICD-10-CM

## 2017-04-09 DIAGNOSIS — E860 Dehydration: Secondary | ICD-10-CM

## 2017-04-09 DIAGNOSIS — Z3689 Encounter for other specified antenatal screening: Secondary | ICD-10-CM

## 2017-04-09 DIAGNOSIS — O26853 Spotting complicating pregnancy, third trimester: Secondary | ICD-10-CM | POA: Insufficient documentation

## 2017-04-09 DIAGNOSIS — N949 Unspecified condition associated with female genital organs and menstrual cycle: Secondary | ICD-10-CM

## 2017-04-09 LAB — URINALYSIS, ROUTINE W REFLEX MICROSCOPIC
BILIRUBIN URINE: NEGATIVE
Glucose, UA: NEGATIVE mg/dL
KETONES UR: NEGATIVE mg/dL
NITRITE: NEGATIVE
PROTEIN: NEGATIVE mg/dL
SPECIFIC GRAVITY, URINE: 1.016 (ref 1.005–1.030)
pH: 6 (ref 5.0–8.0)

## 2017-04-09 LAB — GLUCOSE, CAPILLARY: GLUCOSE-CAPILLARY: 103 mg/dL — AB (ref 65–99)

## 2017-04-09 MED ORDER — POLYETHYLENE GLYCOL 3350 17 GM/SCOOP PO POWD: 17.0000 g | Freq: Every day | ORAL | 0 refills | Status: DC

## 2017-04-09 MED ORDER — COMFORT FIT MATERNITY SUPP MED MISC: 1.0000 | Freq: Every day | 0 refills | Status: DC

## 2017-04-09 NOTE — MAU Provider Note (Signed)
History     CSN: [id number]  Arrival date and time: 04/09/17 1629   First Provider Initiated Contact with Patient 04/09/17 1722      Chief Complaint  Patient presents with  . Abdominal Pain  . Vaginal Bleeding   [name] is a [id number] ([redacted]w[redacted]d) AA female reporting to the MAU with a complaint of "faint, red spotting and dizziness". She states her vaginal spotting started over the last 24 hours and is associated with some yellow vaginal discharge. She tested positive for Trichomonas in 7/18 and on 03/27/17. Provider prescribed Metronidazole which she could not tolerate PO, so she was prescribed a "gel" that she used as prescribed. In addition, she was diagnosed with a yeast infection around the same time. Treated with Terconazole and Fluconazole. Affirms using as prescribed and her symptoms resolved. She denies current vaginal itching or burning.  She states she began having contractions around 1000 this AM (04/09/17) that were paroxysmal. Having pelvic pressure that is similar to what she has felt with prior pregnancies closer to term.She denies abdominal or vaginal trauma. She denies having "sudden gush of fluid".   In addition, patient states she has been feeling lightheaded over the last few days that worsens with positional changes. She says she drinks very little water (1 bottle or less per day) and drinks 4 sodas per day along with juice. She has a history of anemia and is currently taking iron QD (1 pill). She was prescribed two pills per day but had to reduce to 1 pill due to constipation. She affirms she is constipated today and has had some difficulty passing stools. She denies blood on stools or paper. She affirms her stools are firm and darker in color since starting her iron pills. She affirms chewing on ice regularly. She denies epistaxis, bruising, current headaches, chest pain, SOB, sensitivity to hot or cold, arthralgia, myalgia. She denies ever being diagnosed with a  thyroid disorder.  Patient is sexually active. Last intercourse was vaginal in August 2018. Patient is in a monogamous relationship. Partner is incarcerated and she is unsure if he was treated for Trichomonas starting in July when she was first diagnosed.   Patient has no known drug allergies. No history of major surgeries. No chronic medical conditions other than aforementioned anemia.     OB History    Gravida Para Term Preterm AB Living   8 3 3  0 4 3   SAB TAB Ectopic Multiple Live Births   2 2 0 0 3      Past Medical History:  Diagnosis Date  . Adnexal mass 12/30/2015  . Anemia   . Chlamydia   . High blood pressure   . History of severe postpartum preeclampsia in prior pregnancy, currently pregnant 09/01/2014  . Hx of varicella   . Trichomonas vaginitis 2017  . Urinary tract infection     Past Surgical History:  Procedure Laterality Date  . LAPAROSCOPY N/A 12/30/2015   Procedure: LAPAROSCOPY DIAGNOSTIC;  Surgeon: Sherian Rein, MD;  Location: [facility];  Service: Gynecology;  Laterality: N/A;    Family History  Problem Relation Age of Onset  . Hypertension Father   . Hypertension Maternal Grandmother   . Alcohol abuse Neg Hx   . Arthritis Neg Hx   . Birth defects Neg Hx   . Asthma Neg Hx   . Cancer Neg Hx   . COPD Neg Hx   . Depression Neg Hx   . Diabetes Neg Hx   .  Drug abuse Neg Hx   . Early death Neg Hx   . Hearing loss Neg Hx   . Heart disease Neg Hx   . Hyperlipidemia Neg Hx   . Kidney disease Neg Hx   . Learning disabilities Neg Hx   . Mental illness Neg Hx   . Mental retardation Neg Hx   . Miscarriages / Stillbirths Neg Hx   . Vision loss Neg Hx   . Stroke Neg Hx     Social History  Substance Use Topics  . Smoking status: Never Smoker  . Smokeless tobacco: Never Used  . Alcohol use No    Allergies:  Allergies  Allergen Reactions  . No Known Allergies     Prescriptions Prior to Admission  Medication Sig Dispense Refill Last Dose  .  ferrous sulfate (FERROUSUL) 325 (65 FE) MG tablet Take 1 tablet (325 mg total) by mouth 2 (two) times daily. 60 tablet 1 Past Week at Unknown time  . aspirin EC 81 MG tablet Take 1 tablet (81 mg total) by mouth daily. Take after 12 weeks for prevention of preeclampsia later in pregnancy (Patient not taking: Reported on 04/09/2017) 300 tablet 2 Not Taking at Unknown time  . Prenatal-DSS-FeCb-FeGl-FA (CITRANATAL BLOOM) 90-1 MG TABS Take 1 tablet by mouth daily. (Patient not taking: Reported on 04/09/2017) 30 tablet 12 Not Taking at Unknown time  . terconazole (TERAZOL 3) 0.8 % vaginal cream Place 1 applicator vaginally at bedtime. (Patient not taking: Reported on 04/09/2017) 20 g 0 Not Taking at Unknown time    Review of Systems  Constitutional: Negative for appetite change, chills, fatigue, fever and unexpected weight change.  HENT: Negative for congestion, dental problem, hearing loss, nosebleeds, postnasal drip, rhinorrhea, sinus pain and sore throat.   Eyes: Negative for visual disturbance.  Respiratory: Negative for apnea, cough, chest tightness and shortness of breath.   Cardiovascular: Negative for chest pain, palpitations and leg swelling.  Gastrointestinal: Positive for abdominal pain (Mild pain associated with AM contractions) and constipation. Negative for anal bleeding, blood in stool, diarrhea, nausea, rectal pain and vomiting.  Endocrine: Negative for cold intolerance, heat intolerance and polyuria.  Genitourinary: Positive for vaginal bleeding (Light spotting over last 24 hours) and vaginal discharge (Yellow color. Not "curd-like"). Negative for dyspareunia, hematuria, pelvic pain (Does affirm pelvic pressure) and vaginal pain.  Musculoskeletal: Negative for arthralgias, back pain and myalgias.  Skin: Negative for rash and wound.  Neurological: Positive for light-headedness (With positional changes. Rarely "room spinning".). Negative for syncope, numbness and headaches.  Hematological:  Does not bruise/bleed easily.   Physical Exam   Blood pressure 115/74, pulse (!) 123, temperature 97.7 F (36.5 C), temperature source Oral, resp. rate 15, height 5\' 7"  (1.702 m), weight 66.2 kg (146 lb), last menstrual period 06/16/2016, SpO2 100 %, unknown if currently breastfeeding.  Physical Exam  Nursing note and vitals reviewed. Constitutional: She appears well-developed.  HENT:  Head: Normocephalic and atraumatic.  Cardiovascular: Normal rate and regular rhythm.  Exam reveals friction rub. Exam reveals no gallop.   No murmur heard. Respiratory: No respiratory distress. She has no wheezes. She has no rales. She exhibits no tenderness.  GI: There is no tenderness. There is no guarding.  Musculoskeletal: She exhibits no edema or tenderness.  Skin: Skin is warm. No rash noted. No erythema.   Bi-manual exam performed by Donette Larry.  Results for orders placed or performed during the hospital encounter of 04/09/17 (from the past 24 hour(s))  Urinalysis, Routine w  reflex microscopic     Status: Abnormal   Collection Time: 04/09/17  4:40 PM  Result Value Ref Range   Color, Urine YELLOW YELLOW   APPearance HAZY (A) CLEAR   Specific Gravity, Urine 1.016 1.005 - 1.030   pH 6.0 5.0 - 8.0   Glucose, UA NEGATIVE NEGATIVE mg/dL   Hgb urine dipstick SMALL (A) NEGATIVE   Bilirubin Urine NEGATIVE NEGATIVE   Ketones, ur NEGATIVE NEGATIVE mg/dL   Protein, ur NEGATIVE NEGATIVE mg/dL   Nitrite NEGATIVE NEGATIVE   Leukocytes, UA MODERATE (A) NEGATIVE   RBC / HPF 0-5 0 - 5 RBC/hpf   WBC, UA 0-5 0 - 5 WBC/hpf   Bacteria, UA RARE (A) NONE SEEN   Squamous Epithelial / LPF 0-5 (A) NONE SEEN   Mucus PRESENT   Glucose, capillary     Status: Abnormal   Collection Time: 04/09/17  5:14 PM  Result Value Ref Range   Glucose-Capillary 103 (H) 65 - 99 mg/dL   MAU Course  Procedures  Orthostatics - Results- Negative for Orthostatic Hypotension Fetal HR Monitoring with Tocography  MDM UA,  Bimanual Exam to check Cervix Trichomonas testing a week ago (04/01/17). Likely still resolving.  Patient is poorly hydrating, constipated, and anemic. Cervical exam to rule out premature labor.  Assessment and Plan  1. Pre-Term Pregnancy, 34 weeks, 6 days with Pre-Term Contractions. Reactive Non-Stress Test.  1. Patient Advised to return to MAU if contractions increase significantly, rupture of membranes, bleeding, and/or baby's movements decrease significantly.   2. Patient has scheduled pre-natal check this Friday (10/26)/18). 2. Anemia, with PICA  1. Continue taking iron pill.  2. Recommend follow-up with family provider to work up etiology of anemia.  3. Constipation  1. Patient encouraged to drink 6-8 glasses of water per day. Reduce soda to 1 soda per day. Asked to log intake of water and to report to provider at next prenatal check.   2. Increase intake of fruits and vegetables.              3. Rx Miralax 4. Pelvic Pressure with Pregnancy  1. Rx maternity belt.  Huel CoteBrian W L[facility] Surgery [facility] LLCBernish 04/09/2017, 6:03 PM   Midwife attestation I have seen and examined this patient and agree with above documentation in the student's note.  See MAU provider note (mine) for further details.  Donette LarryMelanie Janiyla Long, CNM 7:42 PM

## 2017-04-09 NOTE — MAU Provider Note (Signed)
History     CSN: [id number]  Arrival date and time: 04/09/17 1629   First Provider Initiated Contact with Patient 04/09/17 1722      Chief Complaint  Patient presents with  . Abdominal Pain  . Vaginal Bleeding   [id number] @34 .6 wks here with ctx and spotting. Ctx started this am, she is unsure of how frequent. She had spotting this morning, saw pink on the toilet paper. No bleeding since. No IC since August and partner is currently incarcerated. She was treated for trichomonas in July and given another Rx earlier this month for a positive TOC. She has not picked up the Flagyl from the pharmacy. She also reports feeling lightheaded some days. She is taking Fe daily for anemia. She admits to poor water intake and mostly drinking sodas and juice. She also c/o pelvic pressure, worse with upright and standing positions.   OB History    Gravida Para Term Preterm AB Living   8 3 3  0 4 3   SAB TAB Ectopic Multiple Live Births   2 2 0 0 3      Past Medical History:  Diagnosis Date  . Adnexal mass 12/30/2015  . Anemia   . Chlamydia   . High blood pressure   . History of severe postpartum preeclampsia in prior pregnancy, currently pregnant 09/01/2014  . Hx of varicella   . Trichomonas vaginitis 2017  . Urinary tract infection     Past Surgical History:  Procedure Laterality Date  . LAPAROSCOPY N/A 12/30/2015   Procedure: LAPAROSCOPY DIAGNOSTIC;  Surgeon: Sherian Rein, MD;  Location: [facility];  Service: Gynecology;  Laterality: N/A;    Family History  Problem Relation Age of Onset  . Hypertension Father   . Hypertension Maternal Grandmother   . Alcohol abuse Neg Hx   . Arthritis Neg Hx   . Birth defects Neg Hx   . Asthma Neg Hx   . Cancer Neg Hx   . COPD Neg Hx   . Depression Neg Hx   . Diabetes Neg Hx   . Drug abuse Neg Hx   . Early death Neg Hx   . Hearing loss Neg Hx   . Heart disease Neg Hx   . Hyperlipidemia Neg Hx   . Kidney disease Neg Hx   . Learning  disabilities Neg Hx   . Mental illness Neg Hx   . Mental retardation Neg Hx   . Miscarriages / Stillbirths Neg Hx   . Vision loss Neg Hx   . Stroke Neg Hx     Social History  Substance Use Topics  . Smoking status: Never Smoker  . Smokeless tobacco: Never Used  . Alcohol use No    Allergies:  Allergies  Allergen Reactions  . No Known Allergies     Prescriptions Prior to Admission  Medication Sig Dispense Refill Last Dose  . ferrous sulfate (FERROUSUL) 325 (65 FE) MG tablet Take 1 tablet (325 mg total) by mouth 2 (two) times daily. 60 tablet 1 Past Week at Unknown time  . aspirin EC 81 MG tablet Take 1 tablet (81 mg total) by mouth daily. Take after 12 weeks for prevention of preeclampsia later in pregnancy (Patient not taking: Reported on 04/09/2017) 300 tablet 2 Not Taking at Unknown time  . Prenatal-DSS-FeCb-FeGl-FA (CITRANATAL BLOOM) 90-1 MG TABS Take 1 tablet by mouth daily. (Patient not taking: Reported on 04/09/2017) 30 tablet 12 Not Taking at Unknown time  . terconazole (TERAZOL 3) 0.8 % vaginal  cream Place 1 applicator vaginally at bedtime. (Patient not taking: Reported on 04/09/2017) 20 g 0 Not Taking at Unknown time    Review of Systems  Constitutional: Negative for fever.  Gastrointestinal: Positive for abdominal pain and constipation.  Genitourinary: Positive for vaginal bleeding. Negative for dysuria.  Neurological: Positive for dizziness. Negative for syncope.   Physical Exam   Blood pressure 115/74, pulse (!) 123, temperature 97.7 F (36.5 C), temperature source Oral, resp. rate 15, height 5\' 7"  (1.702 m), weight 146 lb (66.2 kg), last menstrual period 06/16/2016, SpO2 100 %, unknown if currently breastfeeding.  Patient Vitals for the past 24 hrs:  BP Temp Temp src Pulse Resp SpO2 Height Weight  04/09/17 1934 107/66 - - 91 - - - -  04/09/17 1739 115/74 - - (!) 123 - - - -  04/09/17 1735 119/83 - - (!) 109 - - - -  04/09/17 1734 104/71 - - (!) 107 - - - -   04/09/17 1732 113/77 - - (!) 101 - - - -  04/09/17 1638 108/73 97.7 F (36.5 C) Oral (!) 104 15 100 % 5\' 7"  (1.702 m) 146 lb (66.2 kg)   Physical Exam  Nursing note and vitals reviewed. Constitutional: She is oriented to person, place, and time. She appears well-developed and well-nourished.  HENT:  Head: Normocephalic and atraumatic.  Neck: Normal range of motion.  Respiratory: Effort normal.  GI: Soft. She exhibits no distension. There is no tenderness.  gravid  Genitourinary:  Genitourinary Comments: Cervix: 1/thick  Musculoskeletal: Normal range of motion.  Neurological: She is alert and oriented to person, place, and time.  Skin: Skin is warm and dry.  Psychiatric: She has a normal mood and affect.  EFM: 135 bpm, mod variability, + accels, no decels Toco: irregular  Results for orders placed or performed during the hospital encounter of 04/09/17 (from the past 24 hour(s))  Urinalysis, Routine w reflex microscopic     Status: Abnormal   Collection Time: 04/09/17  4:40 PM  Result Value Ref Range   Color, Urine YELLOW YELLOW   APPearance HAZY (A) CLEAR   Specific Gravity, Urine 1.016 1.005 - 1.030   pH 6.0 5.0 - 8.0   Glucose, UA NEGATIVE NEGATIVE mg/dL   Hgb urine dipstick SMALL (A) NEGATIVE   Bilirubin Urine NEGATIVE NEGATIVE   Ketones, ur NEGATIVE NEGATIVE mg/dL   Protein, ur NEGATIVE NEGATIVE mg/dL   Nitrite NEGATIVE NEGATIVE   Leukocytes, UA MODERATE (A) NEGATIVE   RBC / HPF 0-5 0 - 5 RBC/hpf   WBC, UA 0-5 0 - 5 WBC/hpf   Bacteria, UA RARE (A) NONE SEEN   Squamous Epithelial / LPF 0-5 (A) NONE SEEN   Mucus PRESENT   Glucose, capillary     Status: Abnormal   Collection Time: 04/09/17  5:14 PM  Result Value Ref Range   Glucose-Capillary 103 (H) 65 - 99 mg/dL   MAU Course  Procedures  MDM Not orthostatic. Sx likely caused by poor water intake and anemia. No evidence of VB, UTI, or PTL. Spotting likely caused by recent trichomonas infection. Discussed  importance of treatment as this could cause adverse pregnancy outcomes. Discussed adequate hydration and goal of 5-6 bottles of water per day. She is stable for discharge home.   Assessment and Plan  [redacted] weeks gestation Reactive NST Preterm contractions Pica Anemia Spotting in pregnancy Rh pos Constipation Pelvic pressure in pregnancy  Discharge home Follow up in OB office this week as scheduled PTL  precautions Hydrate Rx Miralax Rx maternity belt  Allergies as of 04/09/2017      Reactions   No Known Allergies       Medication List    STOP taking these medications   terconazole 0.8 % vaginal cream Commonly known as:  TERAZOL 3     TAKE these medications   aspirin EC 81 MG tablet Take 1 tablet (81 mg total) by mouth daily. Take after 12 weeks for prevention of preeclampsia later in pregnancy   CITRANATAL BLOOM 90-1 MG Tabs Take 1 tablet by mouth daily.   COMFORT FIT MATERNITY SUPP MED Misc 1 Device by Does not apply route daily.   ferrous sulfate 325 (65 FE) MG tablet Commonly known as:  FERROUSUL Take 1 tablet (325 mg total) by mouth 2 (two) times daily.   polyethylene glycol powder powder Commonly known as:  GLYCOLAX/MIRALAX Take 17 g by mouth daily.      Donette LarryMelanie Kynleigh Artz, CNM 04/09/2017, 6:23 PM

## 2017-04-09 NOTE — MAU Note (Signed)
Pt reports a pinkish discharge and abd pain since this am. Reports good fetal movement. States she feels light-headed and dizzy today also

## 2017-04-09 NOTE — Discharge Instructions (Signed)
Pregnancy and Anemia Anemia is a condition in which the concentration of red blood cells or hemoglobin in the blood is below normal. Hemoglobin is a substance in red blood cells that carries oxygen to the tissues of the body. Anemia results in not enough oxygen reaching these tissues. Anemia during pregnancy is common because the fetus uses more iron and folic acid as it is developing. Your body may not produce enough red blood cells because of this. Also, during pregnancy, the liquid part of the blood (plasma) increases by about 50%, and the red blood cells increase by only 25%. This lowers the concentration of the red blood cells and creates a natural anemia-like situation. What are the causes? The most common cause of anemia during pregnancy is not having enough iron in the body to make red blood cells (iron deficiency anemia). Other causes may include:  Folic acid deficiency.  Vitamin B12 deficiency.  Certain prescription or over-the-counter medicines.  Certain medical conditions or infections that destroy red blood cells.  A low platelet count and bleeding caused by antibodies that go through the placenta to the fetus from the mothers blood.  What are the signs or symptoms? Mild anemia may not be noticeable. If it becomes severe, symptoms may include:  Tiredness.  Shortness of breath, especially with exercise.  Weakness.  Fainting.  Pale looking skin.  Headaches.  Feeling a fast or irregular heartbeat (palpitations).  How is this diagnosed? The type of anemia is usually diagnosed from your family and medical history and blood tests. How is this treated? Treatment of anemia during pregnancy depends on the cause of the anemia. Treatment can include:  Supplements of iron, vitamin B12, or folic acid.  A blood transfusion. This may be needed if blood loss is severe.  Hospitalization. This may be needed if there is significant continual blood loss.  Dietary  changes.  Follow these instructions at home:  Follow your dietitian's or health care provider's dietary recommendations.  Increase your vitamin C intake. This will help the stomach absorb more iron.  Eat a diet rich in iron. This would include foods such as: ? Liver. ? Beef. ? Whole grain bread. ? Eggs. ? Dried fruit.  Take iron and vitamins as directed by your health care provider.  Eat green leafy vegetables. These are a good source of folic acid. Contact a health care provider if:  You have frequent or lasting headaches.  You are looking pale.  You are bruising easily. Get help right away if:  You have extreme weakness, shortness of breath, or chest pain.  You become dizzy or have trouble concentrating.  You have heavy vaginal bleeding.  You develop a rash.  You have bloody or black, tarry stools.  You faint.  You vomit up blood.  You vomit repeatedly.  You have abdominal pain.  You have a fever or persistent symptoms for more than 2-3 days.  You have a fever and your symptoms suddenly get worse.  You are dehydrated. This information is not intended to replace advice given to you by your health care provider. Make sure you discuss any questions you have with your health care provider. Document Released: 06/01/2000 Document Revised: 11/10/2015 Document Reviewed: 01/14/2013 Elsevier Interactive Patient Education  2017 [employer] of the uterus can occur throughout pregnancy, but they are not always a sign that you are in labor. You may have practice contractions called Braxton Hicks contractions. These false labor contractions are sometimes confused with  true labor. What are Deberah PeltonBraxton Hicks contractions? Braxton Hicks contractions are tightening movements that occur in the muscles of the uterus before labor. Unlike true labor contractions, these contractions do not result in opening (dilation) and thinning of the  cervix. Toward the end of pregnancy (32-34 weeks), Braxton Hicks contractions can happen more often and may become stronger. These contractions are sometimes difficult to tell apart from true labor because they can be very uncomfortable. You should not feel embarrassed if you go to the hospital with false labor. Sometimes, the only way to tell if you are in true labor is for your health care provider to look for changes in the cervix. The health care provider will do a physical exam and may monitor your contractions. If you are not in true labor, the exam should show that your cervix is not dilating and your water has not broken. If there are no prenatal problems or other health problems associated with your pregnancy, it is completely safe for you to be sent home with false labor. You may continue to have Braxton Hicks contractions until you go into true labor. How can I tell the difference between true labor and false labor?  Differences ? False labor ? Contractions last 30-70 seconds.: Contractions are usually shorter and not as strong as true labor contractions. ? Contractions become very regular.: Contractions are usually irregular. ? Discomfort is usually felt in the top of the uterus, and it spreads to the lower abdomen and low back.: Contractions are often felt in the front of the lower abdomen and in the groin. ? Contractions do not go away with walking.: Contractions may go away when you walk around or change positions while lying down. ? Contractions usually become more intense and increase in frequency.: Contractions get weaker and are shorter-lasting as time goes on. ? The cervix dilates and gets thinner.: The cervix usually does not dilate or become thin. Follow these instructions at home:  Take over-the-counter and prescription medicines only as told by your health care provider.  Keep up with your usual exercises and follow other instructions from your health care provider.  Eat  and drink lightly if you think you are going into labor.  If Braxton Hicks contractions are making you uncomfortable: ? Change your position from lying down or resting to walking, or change from walking to resting. ? Sit and rest in a tub of warm water. ? Drink enough fluid to keep your urine clear or pale yellow. Dehydration may cause these contractions. ? Do slow and deep breathing several times an hour.  Keep all follow-up prenatal visits as told by your health care provider. This is important. Contact a health care provider if:  You have a fever.  You have continuous pain in your abdomen. Get help right away if:  Your contractions become stronger, more regular, and closer together.  You have fluid leaking or gushing from your vagina.  You pass blood-tinged mucus (bloody show).  You have bleeding from your vagina.  You have low back pain that you never had before.  You feel your babys head pushing down and causing pelvic pressure.  Your baby is not moving inside you as much as it used to. Summary  Contractions that occur before labor are called Braxton Hicks contractions, false labor, or practice contractions.  Braxton Hicks contractions are usually shorter, weaker, farther apart, and less regular than true labor contractions. True labor contractions usually become progressively stronger and regular and they become more  frequent.  Manage discomfort from [facility] contractions by changing position, resting in a warm bath, drinking plenty of water, or practicing deep breathing. This information is not intended to replace advice given to you by your health care provider. Make sure you discuss any questions you have with your health care provider. Document Released: 06/04/2005 Document Revised: 04/23/2016 Document Reviewed: 04/23/2016 Elsevier Interactive Patient Education  2017 [employer]. Constipation, Adult Constipation is when a person:  Poops (has a bowel  movement) fewer times in a week than normal.  Has a hard time pooping.  Has poop that is dry, hard, or bigger than normal.  Follow these instructions at home: Eating and drinking   Eat foods that have a lot of fiber, such as: ? Fresh fruits and vegetables. ? Whole grains. ? Beans.  Eat less of foods that are high in fat, low in fiber, or overly processed, such as: ? [place] fries. ? Hamburgers. ? Cookies. ? Candy. ? Soda.  Drink enough fluid to keep your pee (urine) clear or pale yellow. General instructions  Exercise regularly or as told by your doctor.  Go to the restroom when you feel like you need to poop. Do not hold it in.  Take over-the-counter and prescription medicines only as told by your doctor. These include any fiber supplements.  Do pelvic floor retraining exercises, such as: ? Doing deep breathing while relaxing your lower belly (abdomen). ? Relaxing your pelvic floor while pooping.  Watch your condition for any changes.  Keep all follow-up visits as told by your doctor. This is important. Contact a doctor if:  You have pain that gets worse.  You have a fever.  You have not pooped for 4 days.  You throw up (vomit).  You are not hungry.  You lose weight.  You are bleeding from the anus.  You have thin, pencil-like poop (stool). Get help right away if:  You have a fever, and your symptoms suddenly get worse.  You leak poop or have blood in your poop.  Your belly feels hard or bigger than normal (is bloated).  You have very bad belly pain.  You feel dizzy or you faint. This information is not intended to replace advice given to you by your health care provider. Make sure you discuss any questions you have with your health care provider. Document Released: 11/21/2007 Document Revised: 12/23/2015 Document Reviewed: 11/23/2015 Elsevier Interactive Patient Education  2017 [employer].

## 2017-04-09 NOTE — Telephone Encounter (Signed)
Received VM from pt. LVM for pt to c/b

## 2017-04-09 NOTE — Telephone Encounter (Signed)
Pt c/o sporadic VB noticed in underwear. Last IC months ago per pt. Denies pain, denies LOF, +FM. Pt tested +Trich 10/10 but did not pick up rx. Pt agrees to pick up rx STAT. Pt states partner is incarcerated. Pt also c/o lightheadedness and dizziness after she "jumped" out the bed this morning. Pt ate bacon, waffle, eggs, and drunk one bottle of water. Pt admits it's difficult to drink water. Urged pt to drink at least 64 oz of water daily; she agrees. Consulted with CNM pt to report to MAU for further observation.

## 2017-04-09 NOTE — MAU Note (Signed)
Pitcher of water given, PO hydrate.

## 2017-04-12 ENCOUNTER — Encounter: Payer: Self-pay | Admitting: Obstetrics

## 2017-04-12 ENCOUNTER — Ambulatory Visit (INDEPENDENT_AMBULATORY_CARE_PROVIDER_SITE_OTHER): Payer: Medicaid Other | Admitting: Certified Nurse Midwife

## 2017-04-12 ENCOUNTER — Other Ambulatory Visit (HOSPITAL_COMMUNITY)
Admission: RE | Admit: 2017-04-12 | Discharge: 2017-04-12 | Disposition: A | Discharge status: Home / Self Care | Payer: Medicaid Other | Source: Ambulatory Visit | Attending: Certified Nurse Midwife | Admitting: Certified Nurse Midwife

## 2017-04-12 VITALS — BP 106/73 | HR 103 | Wt 148.0 lb

## 2017-04-12 DIAGNOSIS — Z3483 Encounter for supervision of other normal pregnancy, third trimester: Secondary | ICD-10-CM

## 2017-04-12 DIAGNOSIS — O99013 Anemia complicating pregnancy, third trimester: Secondary | ICD-10-CM

## 2017-04-12 DIAGNOSIS — A5901 Trichomonal vulvovaginitis: Secondary | ICD-10-CM | POA: Diagnosis not present

## 2017-04-12 DIAGNOSIS — O26843 Uterine size-date discrepancy, third trimester: Secondary | ICD-10-CM

## 2017-04-12 NOTE — Progress Notes (Signed)
   PRENATAL VISIT NOTE  Subjective:  Amy Mclean is a 26 y.o. [phone] at 3730w2d being seen today for ongoing prenatal care.  She is currently monitored for the following issues for this low-risk pregnancy and has History of severe postpartum preeclampsia in prior pregnancy, currently pregnant; Late prenatal care affecting pregnancy in third trimester; Supervision of normal intrauterine pregnancy in multigravida in third trimester; Anemia; Trichomonal vaginitis during pregnancy in third trimester; and Anemia during pregnancy in third trimester on her problem list.  Patient reports no complaints.  Contractions: Irregular. Vag. Bleeding: None.  Movement: Present. Denies leaking of fluid.   The following portions of the patient's history were reviewed and updated as appropriate: allergies, current medications, past family history, past medical history, past social history, past surgical history and problem list. Problem list updated.  Objective:   Vitals:   04/12/17 1102  BP: 106/73  Pulse: (!) 103  Weight: 148 lb (67.1 kg)    Fetal Status: Fetal Heart Rate (bpm): 146; doppler Fundal Height: 33 cm Movement: Present  Presentation: Vertex  General:  Alert, oriented and cooperative. Patient is in no acute distress.  Skin: Skin is warm and dry. No rash noted.   Cardiovascular: Normal heart rate noted  Respiratory: Normal respiratory effort, no problems with respiration noted  Abdomen: Soft, gravid, appropriate for gestational age.  Pain/Pressure: Present     Pelvic: Cervical exam performed Dilation: Closed Effacement (%): 50 Station: Ballotable  Extremities: Normal range of motion.  Edema: None  Mental Status:  Normal mood and affect. Normal behavior. Normal judgment and thought content.   Assessment and Plan:  Pregnancy: [id number] at 6830w2d  1. Supervision of normal intrauterine pregnancy in multigravida in third trimester     Doing well.  - Strep Gp B NAA - Cervicovaginal ancillary  only  2. Anemia during pregnancy in third trimester     Taking OTC iron.   3. Uterine size date discrepancy pregnancy, third trimester      Size < Dates - US MFM OB FOLLOW UP; Future  Preterm labor symptoms and general obstetric precautions including but not limited to vaginal bleeding, contractions, leaking of fluid and fetal movement were reviewed in detail with the patient. Please refer to After Visit Summary for other counseling recommendations.  Return in about 1 week (around 04/19/2017) for ROB.   Roe Coombsachelle A Perle Gibbon, CNM

## 2017-04-12 NOTE — Progress Notes (Signed)
Patient reports good fetal movement with irregular contractions. Pt states that she forgets to take PNV

## 2017-04-14 LAB — STREP GP B NAA: Strep Gp B NAA: NEGATIVE

## 2017-04-16 ENCOUNTER — Other Ambulatory Visit: Payer: Self-pay | Admitting: Certified Nurse Midwife

## 2017-04-16 DIAGNOSIS — Z3483 Encounter for supervision of other normal pregnancy, third trimester: Secondary | ICD-10-CM

## 2017-04-16 LAB — CERVICOVAGINAL ANCILLARY ONLY
BACTERIAL VAGINITIS: NEGATIVE
CANDIDA VAGINITIS: NEGATIVE
Chlamydia: NEGATIVE
Neisseria Gonorrhea: NEGATIVE
TRICH (WINDOWPATH): POSITIVE — AB

## 2017-04-17 ENCOUNTER — Other Ambulatory Visit (HOSPITAL_COMMUNITY): Payer: Self-pay | Admitting: *Deleted

## 2017-04-17 ENCOUNTER — Other Ambulatory Visit: Payer: Self-pay | Admitting: Certified Nurse Midwife

## 2017-04-17 ENCOUNTER — Ambulatory Visit (HOSPITAL_COMMUNITY)
Admission: RE | Admit: 2017-04-17 | Discharge: 2017-04-17 | Disposition: A | Discharge status: Home / Self Care | Payer: Medicaid Other | Source: Ambulatory Visit | Attending: Certified Nurse Midwife | Admitting: Certified Nurse Midwife

## 2017-04-17 DIAGNOSIS — O23593 Infection of other part of genital tract in pregnancy, third trimester: Principal | ICD-10-CM

## 2017-04-17 DIAGNOSIS — B373 Candidiasis of vulva and vagina: Secondary | ICD-10-CM

## 2017-04-17 DIAGNOSIS — A5901 Trichomonal vulvovaginitis: Secondary | ICD-10-CM

## 2017-04-17 DIAGNOSIS — I313 Pericardial effusion (noninflammatory): Secondary | ICD-10-CM | POA: Diagnosis not present

## 2017-04-17 DIAGNOSIS — O26843 Uterine size-date discrepancy, third trimester: Secondary | ICD-10-CM | POA: Diagnosis present

## 2017-04-17 DIAGNOSIS — Z3A36 36 weeks gestation of pregnancy: Secondary | ICD-10-CM | POA: Diagnosis not present

## 2017-04-17 DIAGNOSIS — O359XX0 Maternal care for (suspected) fetal abnormality and damage, unspecified, not applicable or unspecified: Secondary | ICD-10-CM | POA: Diagnosis not present

## 2017-04-17 DIAGNOSIS — B3731 Acute candidiasis of vulva and vagina: Secondary | ICD-10-CM

## 2017-04-17 MED ORDER — METRONIDAZOLE 500 MG PO TABS: 2000.0000 mg | ORAL_TABLET | Freq: Once | ORAL | 0 refills | Status: AC

## 2017-04-17 MED ORDER — FLUCONAZOLE 150 MG PO TABS: 150.0000 mg | ORAL_TABLET | Freq: Once | ORAL | 0 refills | Status: AC

## 2017-04-17 MED ORDER — TERCONAZOLE 0.8 % VA CREA: 1.0000 | TOPICAL_CREAM | Freq: Every day | VAGINAL | 0 refills | Status: DC

## 2017-04-18 ENCOUNTER — Encounter (HOSPITAL_COMMUNITY): Payer: Self-pay

## 2017-04-19 ENCOUNTER — Encounter: Payer: Medicaid Other | Admitting: Certified Nurse Midwife

## 2017-04-22 ENCOUNTER — Encounter: Payer: Self-pay | Admitting: Obstetrics

## 2017-04-22 ENCOUNTER — Other Ambulatory Visit: Payer: Self-pay | Admitting: Certified Nurse Midwife

## 2017-04-22 ENCOUNTER — Ambulatory Visit (INDEPENDENT_AMBULATORY_CARE_PROVIDER_SITE_OTHER): Payer: Medicaid Other | Admitting: Certified Nurse Midwife

## 2017-04-22 VITALS — BP 106/71 | HR 92 | Wt 146.8 lb

## 2017-04-22 DIAGNOSIS — Z3483 Encounter for supervision of other normal pregnancy, third trimester: Secondary | ICD-10-CM

## 2017-04-22 DIAGNOSIS — O09293 Supervision of pregnancy with other poor reproductive or obstetric history, third trimester: Secondary | ICD-10-CM

## 2017-04-22 DIAGNOSIS — O09299 Supervision of pregnancy with other poor reproductive or obstetric history, unspecified trimester: Secondary | ICD-10-CM

## 2017-04-22 MED ORDER — VITAFOL GUMMIES 3.33-0.333-34.8 MG PO CHEW: 3.0000 | CHEWABLE_TABLET | Freq: Every day | ORAL | 12 refills | Status: DC

## 2017-04-22 MED ORDER — PRENATE PIXIE 10-0.6-0.4-200 MG PO CAPS: 1.0000 | ORAL_CAPSULE | Freq: Every day | ORAL | 12 refills | Status: DC

## 2017-04-22 NOTE — Progress Notes (Signed)
   PRENATAL VISIT NOTE  Subjective:  Amy Mclean is a 26 y.o. [phone] at 4530w5d being seen today for ongoing prenatal care.  She is currently monitored for the following issues for this low-risk pregnancy and has History of severe postpartum preeclampsia in prior pregnancy, currently pregnant; Late prenatal care affecting pregnancy in third trimester; Supervision of normal intrauterine pregnancy in multigravida in third trimester; Anemia; Trichomonal vaginitis during pregnancy in third trimester; and Anemia during pregnancy in third trimester on their problem list.  Patient reports no complaints.  Contractions: Not present. Vag. Bleeding: None.  Movement: Present. Denies leaking of fluid.   The following portions of the patient's history were reviewed and updated as appropriate: allergies, current medications, past family history, past medical history, past social history, past surgical history and problem list. Problem list updated.  Objective:   Vitals:   04/22/17 1611  BP: 106/71  Pulse: 92  Weight: 146 lb 12.8 oz (66.6 kg)    Fetal Status: Fetal Heart Rate (bpm): 142; doppler Fundal Height: 36 cm Movement: Present     General:  Alert, oriented and cooperative. Patient is in no acute distress.  Skin: Skin is warm and dry. No rash noted.   Cardiovascular: Normal heart rate noted  Respiratory: Normal respiratory effort, no problems with respiration noted  Abdomen: Soft, gravid, appropriate for gestational age.  Pain/Pressure: Absent     Pelvic: Cervical exam deferred        Extremities: Normal range of motion.  Edema: None  Mental Status:  Normal mood and affect. Normal behavior. Normal judgment and thought content.   Assessment and Plan:  Pregnancy: [id number] at 3130w5d  1. Supervision of normal intrauterine pregnancy in multigravida in third trimester     Doing well.  Desires to take the gummies. RX sent.   - Prenatal Vit-Fe Phos-FA-Omega (VITAFOL GUMMIES) 3.33-0.333-34.8 MG  CHEW; Chew 3 tablets at bedtime by mouth.  Dispense: 90 tablet; Refill: 12  2. History of severe postpartum preeclampsia in prior pregnancy, currently pregnant     Completed baby ASA tx, normotensive today in office.   Preterm labor symptoms and general obstetric precautions including but not limited to vaginal bleeding, contractions, leaking of fluid and fetal movement were reviewed in detail with the patient. Please refer to After Visit Summary for other counseling recommendations.  Return in about 1 week (around 04/29/2017) for ROB.   Roe Coombsachelle A Gurnie Duris, CNM

## 2017-04-22 NOTE — Progress Notes (Signed)
Patient reports good fetal movement, denies pain. 

## 2017-04-29 ENCOUNTER — Ambulatory Visit (INDEPENDENT_AMBULATORY_CARE_PROVIDER_SITE_OTHER): Payer: Medicaid Other | Admitting: Certified Nurse Midwife

## 2017-04-29 ENCOUNTER — Encounter: Payer: Self-pay | Admitting: Obstetrics

## 2017-04-29 VITALS — BP 110/81 | HR 85 | Wt 149.0 lb

## 2017-04-29 DIAGNOSIS — O09293 Supervision of pregnancy with other poor reproductive or obstetric history, third trimester: Secondary | ICD-10-CM

## 2017-04-29 DIAGNOSIS — Z3483 Encounter for supervision of other normal pregnancy, third trimester: Secondary | ICD-10-CM

## 2017-04-29 DIAGNOSIS — O99013 Anemia complicating pregnancy, third trimester: Secondary | ICD-10-CM

## 2017-04-29 DIAGNOSIS — O09299 Supervision of pregnancy with other poor reproductive or obstetric history, unspecified trimester: Secondary | ICD-10-CM

## 2017-04-29 MED ORDER — CITRANATAL BLOOM 90-1 MG PO TABS: 1.0000 | ORAL_TABLET | Freq: Every day | ORAL | 12 refills | Status: DC

## 2017-04-29 NOTE — Progress Notes (Signed)
   PRENATAL VISIT NOTE  Subjective:  Amy Mclean is a 26 y.o. [phone] at 5117w5d being seen today for ongoing prenatal care.  She is currently monitored for the following issues for this low-risk pregnancy and has History of severe postpartum preeclampsia in prior pregnancy, currently pregnant; Late prenatal care affecting pregnancy in third trimester; Supervision of normal intrauterine pregnancy in multigravida in third trimester; Anemia; Trichomonal vaginitis during pregnancy in third trimester; and Anemia during pregnancy in third trimester on their problem list.  Patient reports fatigue, no bleeding, no contractions, no cramping and no leaking.  Contractions: Irritability. Vag. Bleeding: None.  Movement: Present. Denies leaking of fluid.   The following portions of the patient's history were reviewed and updated as appropriate: allergies, current medications, past family history, past medical history, past social history, past surgical history and problem list. Problem list updated.  Objective:   Vitals:   04/29/17 1609  BP: 110/81  Pulse: 85  Weight: 149 lb (67.6 kg)    Fetal Status: Fetal Heart Rate (bpm): 155; doppler Fundal Height: 37 cm Movement: Present     General:  Alert, oriented and cooperative. Patient is in no acute distress.  Skin: Skin is warm and dry. No rash noted.   Cardiovascular: Normal heart rate noted  Respiratory: Normal respiratory effort, no problems with respiration noted  Abdomen: Soft, gravid, appropriate for gestational age.  Pain/Pressure: Present     Pelvic: Cervical exam deferred        Extremities: Normal range of motion.  Edema: None  Mental Status:  Normal mood and affect. Normal behavior. Normal judgment and thought content.   Assessment and Plan:  Pregnancy: [id number] at 10517w5d  1. Supervision of normal intrauterine pregnancy in multigravida in third trimester      Doing well overall.  Reports occasionally taking her OTC iron.    2. History  of severe postpartum preeclampsia in prior pregnancy, currently pregnant     Normotensive today, completed baby asa tx.   3. Anemia during pregnancy in third trimester     OTC iron as well as [facility] - CBC - Prenatal-DSS-FeCb-FeGl-FA (CITRANATAL BLOOM) 90-1 MG TABS; Take 1 tablet daily by mouth.  Dispense: 30 tablet; Refill: 12  Term labor symptoms and general obstetric precautions including but not limited to vaginal bleeding, contractions, leaking of fluid and fetal movement were reviewed in detail with the patient. Please refer to After Visit Summary for other counseling recommendations.  Return in about 1 week (around 05/06/2017) for ROB.   Roe Coombsachelle A Daylin Eads, CNM

## 2017-04-30 LAB — CBC
HEMOGLOBIN: 10.1 g/dL — AB (ref 11.1–15.9)
Hematocrit: 32.1 % — ABNORMAL LOW (ref 34.0–46.6)
MCH: 26.7 pg (ref 26.6–33.0)
MCHC: 31.5 g/dL (ref 31.5–35.7)
MCV: 85 fL (ref 79–97)
Platelets: 230 10*3/uL (ref 150–379)
RBC: 3.78 x10E6/uL (ref 3.77–5.28)
RDW: 17.5 % — ABNORMAL HIGH (ref 12.3–15.4)
WBC: 10.1 10*3/uL (ref 3.4–10.8)

## 2017-05-06 ENCOUNTER — Encounter: Payer: Medicaid Other | Admitting: Certified Nurse Midwife

## 2017-05-06 ENCOUNTER — Telehealth: Payer: Self-pay

## 2017-05-06 NOTE — Telephone Encounter (Signed)
Pt walked in requesting letter to be written out of work for the duration of her pregnancy. Letter written and signed by Boykin Reaperachelle

## 2017-05-13 ENCOUNTER — Encounter: Payer: Self-pay | Admitting: Certified Nurse Midwife

## 2017-05-13 ENCOUNTER — Ambulatory Visit (INDEPENDENT_AMBULATORY_CARE_PROVIDER_SITE_OTHER): Payer: Medicaid Other | Admitting: Certified Nurse Midwife

## 2017-05-13 VITALS — BP 120/81 | HR 96 | Wt 151.0 lb

## 2017-05-13 DIAGNOSIS — O99013 Anemia complicating pregnancy, third trimester: Secondary | ICD-10-CM

## 2017-05-13 DIAGNOSIS — O09299 Supervision of pregnancy with other poor reproductive or obstetric history, unspecified trimester: Secondary | ICD-10-CM

## 2017-05-13 DIAGNOSIS — Z3483 Encounter for supervision of other normal pregnancy, third trimester: Secondary | ICD-10-CM

## 2017-05-13 NOTE — Progress Notes (Signed)
Pt c/o intermittent contractions over the weekend, pain and pressure in vagina/rectum.

## 2017-05-13 NOTE — Progress Notes (Signed)
   PRENATAL VISIT NOTE  Subjective:  Amy Mclean is a 26 y.o. [phone] at 6075w5d being seen today for ongoing prenatal care.  She is currently monitored for the following issues for this low-risk pregnancy and has History of severe postpartum preeclampsia in prior pregnancy, currently pregnant; Late prenatal care affecting pregnancy in third trimester; Supervision of normal intrauterine pregnancy in multigravida in third trimester; Anemia; Trichomonal vaginitis during pregnancy in third trimester; and Anemia during pregnancy in third trimester on their problem list.  Patient reports no bleeding, no leaking and occasional contractions.  Contractions: Irregular. Vag. Bleeding: None.  Movement: Present. Denies leaking of fluid.   The following portions of the patient's history were reviewed and updated as appropriate: allergies, current medications, past family history, past medical history, past social history, past surgical history and problem list. Problem list updated.  Objective:   Vitals:   05/13/17 1100  BP: 120/81  Pulse: 96  Weight: 151 lb (68.5 kg)    Fetal Status: Fetal Heart Rate (bpm): 138; doppler Fundal Height: 35 cm Movement: Present  Presentation: Vertex  General:  Alert, oriented and cooperative. Patient is in no acute distress.  Skin: Skin is warm and dry. No rash noted.   Cardiovascular: Normal heart rate noted  Respiratory: Normal respiratory effort, no problems with respiration noted  Abdomen: Soft, gravid, appropriate for gestational age.  Pain/Pressure: Present     Pelvic: Cervical exam performed Dilation: 3 Effacement (%): 50 Station: -3  Extremities: Normal range of motion.  Edema: None  Mental Status:  Normal mood and affect. Normal behavior. Normal judgment and thought content.   Assessment and Plan:  Pregnancy: [id number] at 4375w5d  1. Supervision of normal intrauterine pregnancy in multigravida in third trimester     Doing well.  IOL scheduled for 41 weeks  per protocol.   2. History of severe postpartum preeclampsia in prior pregnancy, currently pregnant     Normotensive, completed ASA tx.   3. Anemia during pregnancy in third trimester     Taking OTC iron, anemia improved to 10.1 hgb from 8.7.   Term labor symptoms and general obstetric precautions including but not limited to vaginal bleeding, contractions, leaking of fluid and fetal movement were reviewed in detail with the patient. Please refer to After Visit Summary for other counseling recommendations.  Return in about 1 week (around 05/20/2017) for ROB, NST.   Roe Coombsachelle A Ermin Parisien, CNM

## 2017-05-14 ENCOUNTER — Telehealth (HOSPITAL_COMMUNITY): Payer: Self-pay | Admitting: *Deleted

## 2017-05-14 NOTE — Telephone Encounter (Signed)
Preadmission screen  

## 2017-05-16 ENCOUNTER — Inpatient Hospital Stay (HOSPITAL_COMMUNITY)
Admission: AD | Admit: 2017-05-16 | Discharge: 2017-05-18 | DRG: 807 | Disposition: A | Discharge status: Home / Self Care | Payer: Medicaid Other | Source: Ambulatory Visit | Attending: Obstetrics & Gynecology | Admitting: Obstetrics & Gynecology

## 2017-05-16 ENCOUNTER — Other Ambulatory Visit: Payer: Self-pay

## 2017-05-16 ENCOUNTER — Encounter (HOSPITAL_COMMUNITY): Payer: Self-pay | Admitting: *Deleted

## 2017-05-16 DIAGNOSIS — Z3A40 40 weeks gestation of pregnancy: Secondary | ICD-10-CM

## 2017-05-16 DIAGNOSIS — O23593 Infection of other part of genital tract in pregnancy, third trimester: Secondary | ICD-10-CM

## 2017-05-16 DIAGNOSIS — A5901 Trichomonal vulvovaginitis: Secondary | ICD-10-CM

## 2017-05-16 DIAGNOSIS — Z3483 Encounter for supervision of other normal pregnancy, third trimester: Secondary | ICD-10-CM | POA: Diagnosis present

## 2017-05-16 DIAGNOSIS — O0933 Supervision of pregnancy with insufficient antenatal care, third trimester: Secondary | ICD-10-CM

## 2017-05-16 MED ORDER — OXYCODONE-ACETAMINOPHEN 5-325 MG PO TABS: 1.0000 | ORAL_TABLET | ORAL | Status: DC | PRN

## 2017-05-16 MED ORDER — OXYTOCIN BOLUS FROM INFUSION
500.0000 mL | Freq: Once | INTRAVENOUS | Status: AC
  Administered 2017-05-17: 500 mL via INTRAVENOUS

## 2017-05-16 MED ORDER — SOD CITRATE-CITRIC ACID 500-334 MG/5ML PO SOLN: 30.0000 mL | ORAL | Status: DC | PRN

## 2017-05-16 MED ORDER — LACTATED RINGERS IV SOLN: 500.0000 mL | INTRAVENOUS | Status: DC | PRN

## 2017-05-16 MED ORDER — LACTATED RINGERS IV SOLN
500.0000 mL | Freq: Once | INTRAVENOUS | Status: AC
  Administered 2017-05-16: 500 mL via INTRAVENOUS

## 2017-05-16 MED ORDER — PHENYLEPHRINE 40 MCG/ML (10ML) SYRINGE FOR IV PUSH (FOR BLOOD PRESSURE SUPPORT)
80.0000 ug | PREFILLED_SYRINGE | INTRAVENOUS | Status: DC | PRN
  Filled 2017-05-16: qty 5

## 2017-05-16 MED ORDER — OXYTOCIN 40 UNITS IN LACTATED RINGERS INFUSION - SIMPLE MED: 2.5000 [IU]/h | INTRAVENOUS | Status: DC

## 2017-05-16 MED ORDER — MISOPROSTOL 200 MCG PO TABS
ORAL_TABLET | ORAL | Status: AC
  Filled 2017-05-16: qty 5

## 2017-05-16 MED ORDER — LACTATED RINGERS IV SOLN
INTRAVENOUS | Status: DC
  Administered 2017-05-16: 23:00:00 via INTRAVENOUS

## 2017-05-16 MED ORDER — OXYTOCIN 40 UNITS IN LACTATED RINGERS INFUSION - SIMPLE MED
INTRAVENOUS | Status: AC
  Filled 2017-05-16: qty 1000

## 2017-05-16 MED ORDER — OXYCODONE-ACETAMINOPHEN 5-325 MG PO TABS: 2.0000 | ORAL_TABLET | ORAL | Status: DC | PRN

## 2017-05-16 MED ORDER — ONDANSETRON HCL 4 MG/2ML IJ SOLN: 4.0000 mg | Freq: Four times a day (QID) | INTRAMUSCULAR | Status: DC | PRN

## 2017-05-16 MED ORDER — LIDOCAINE HCL (PF) 1 % IJ SOLN
INTRAMUSCULAR | Status: AC
  Filled 2017-05-16: qty 30

## 2017-05-16 MED ORDER — OXYTOCIN 10 UNIT/ML IJ SOLN
INTRAMUSCULAR | Status: AC
  Filled 2017-05-16: qty 2

## 2017-05-16 MED ORDER — ACETAMINOPHEN 325 MG PO TABS: 650.0000 mg | ORAL_TABLET | ORAL | Status: DC | PRN

## 2017-05-16 MED ORDER — DIPHENHYDRAMINE HCL 50 MG/ML IJ SOLN: 12.5000 mg | INTRAMUSCULAR | Status: DC | PRN

## 2017-05-16 MED ORDER — LIDOCAINE HCL (PF) 1 % IJ SOLN
30.0000 mL | INTRAMUSCULAR | Status: DC | PRN
Start: 2017-05-16 — End: 2017-05-17
  Filled 2017-05-16: qty 30

## 2017-05-16 MED ORDER — PHENYLEPHRINE 40 MCG/ML (10ML) SYRINGE FOR IV PUSH (FOR BLOOD PRESSURE SUPPORT)
80.0000 ug | PREFILLED_SYRINGE | INTRAVENOUS | Status: DC | PRN
  Filled 2017-05-16: qty 5
  Filled 2017-05-16: qty 10

## 2017-05-16 MED ORDER — FENTANYL CITRATE (PF) 100 MCG/2ML IJ SOLN: 50.0000 ug | INTRAMUSCULAR | Status: DC | PRN

## 2017-05-16 MED ORDER — FENTANYL 2.5 MCG/ML BUPIVACAINE 1/10 % EPIDURAL INFUSION (WH - ANES)
14.0000 mL/h | INTRAMUSCULAR | Status: DC | PRN
  Administered 2017-05-17: 14 mL/h via EPIDURAL
  Filled 2017-05-16: qty 100

## 2017-05-16 MED ORDER — EPHEDRINE 5 MG/ML INJ
10.0000 mg | INTRAVENOUS | Status: DC | PRN
  Filled 2017-05-16: qty 2

## 2017-05-16 MED ORDER — FLEET ENEMA 7-19 GM/118ML RE ENEM: 1.0000 | ENEMA | RECTAL | Status: DC | PRN

## 2017-05-16 NOTE — MAU Note (Signed)
PT  SAYS  [facility] HURT SINCE 8PM  .  VE IN OFFICE - 3 CM AND STRIPPED MEMBRANES   . DENIES HSV AND  MRSA. GBS- UNSURE

## 2017-05-16 NOTE — H&P (Signed)
Amy Mclean is a 26 y.o. female [phone] with IUP at 8651w1d presenting for labor. Pt states she has been having regular, every 2-3 minutes contractions, associated with none vaginal bleeding for several hours..  Membranes are intact, with active fetal movement.   [facility]  Prenatal History/Complications: Late care Past Medical History: Past Medical History:  Diagnosis Date  . Adnexal mass 12/30/2015  . Anemia   . Chlamydia   . High blood pressure   . History of severe postpartum preeclampsia in prior pregnancy, currently pregnant 09/01/2014  . Hx of varicella   . Trichomonas vaginitis 2017  . Urinary tract infection     Past Surgical History: Past Surgical History:  Procedure Laterality Date  . LAPAROSCOPY N/A 12/30/2015   Procedure: LAPAROSCOPY DIAGNOSTIC;  Surgeon: Sherian ReinJody Bovard-Stuckert, MD;  Location: [facility];  Service: Gynecology;  Laterality: N/A;    Obstetrical History: OB History    Gravida Para Term Preterm AB Living   8 3 3  0 4 3   SAB TAB Ectopic Multiple Live Births   2 2 0 0 3       Social History: Social History   Socioeconomic History  . Marital status: Single    Spouse name: None  . Number of children: None  . Years of education: None  . Highest education level: None  Social Needs  . Financial resource strain: None  . Food insecurity - worry: None  . Food insecurity - inability: None  . Transportation needs - medical: None  . Transportation needs - non-medical: None  Occupational History  . None  Tobacco Use  . Smoking status: Never Smoker  . Smokeless tobacco: Never Used  Substance and Sexual Activity  . Alcohol use: No  . Drug use: No  . Sexual activity: Not Currently    Birth control/protection: None  Other Topics Concern  . None  Social History Narrative  . None    Family History: Family History  Problem Relation Age of Onset  . Hypertension Father   . Hypertension Maternal Grandmother   . Alcohol abuse Neg  Hx   . Arthritis Neg Hx   . Birth defects Neg Hx   . Asthma Neg Hx   . Cancer Neg Hx   . COPD Neg Hx   . Depression Neg Hx   . Diabetes Neg Hx   . Drug abuse Neg Hx   . Early death Neg Hx   . Hearing loss Neg Hx   . Heart disease Neg Hx   . Hyperlipidemia Neg Hx   . Kidney disease Neg Hx   . Learning disabilities Neg Hx   . Mental illness Neg Hx   . Mental retardation Neg Hx   . Miscarriages / Stillbirths Neg Hx   . Vision loss Neg Hx   . Stroke Neg Hx     Allergies: Allergies  Allergen Reactions  . No Known Allergies     Medications Prior to Admission  Medication Sig Dispense Refill Last Dose  . Elastic Bandages & Supports (COMFORT FIT MATERNITY SUPP MED) MISC 1 Device by Does not apply route daily. 1 each 0 Taking  . ferrous sulfate (FERROUSUL) 325 (65 FE) MG tablet Take 1 tablet (325 mg total) by mouth 2 (two) times daily. 60 tablet 1 Taking  . Prenatal Vit-Fe Phos-FA-Omega (VITAFOL GUMMIES) 3.33-0.333-34.8 MG CHEW Chew 3 tablets at bedtime by mouth. (Patient not taking: Reported on 05/13/2017) 90 tablet 12 Not Taking  . Prenatal-DSS-FeCb-FeGl-FA (  CITRANATAL BLOOM) 90-1 MG TABS Take 1 tablet daily by mouth. (Patient not taking: Reported on 05/13/2017) 30 tablet 12 Not Taking        Review of Systems   Constitutional: Negative for fever and chills Eyes: Negative for visual disturbances Respiratory: Negative for shortness of breath, dyspnea Cardiovascular: Negative for chest pain or palpitations  Gastrointestinal: Negative for vomiting, diarrhea and constipation.  POSITIVE for abdominal pain (contractions) Genitourinary: Negative for dysuria and urgency Musculoskeletal: Negative for back pain, joint pain, myalgias  Neurological: Negative for dizziness and headaches      Blood pressure (!) 124/93, pulse (!) 122, temperature 97.8 F (36.6 C), temperature source Oral, resp. rate 16, height 5\' 7"  (1.702 m), weight 68.8 kg (151 lb 12 oz), last menstrual period  06/16/2016, unknown if currently breastfeeding. General appearance: alert, cooperative and mild distress Lungs: clear to auscultation bilaterally Heart: regular rate and rhythm Abdomen: soft, non-tender; bowel sounds normal Extremities: Homans sign is negative, no sign of DVT DTR's 2+ Presentation: cephalic Fetal monitoring  Baseline: 145 bpm, Variability: Good {> 6 bpm), Accelerations: Reactive and Decelerations: Absent Uterine activity  2-3     Prenatal labs: ABO, Rh: O/Positive/-- (07/19 1532) Antibody: Negative (07/19 1532) Rubella: 1.82 (07/19 1532) RPR: Non Reactive (09/12 1120)  HBsAg: Negative (07/19 1532)  HIV:    GBS: Negative (10/26 1146)    [facility] Prenatal Labs  Dating 22 week scan Blood type: O/Positive/-- (07/19 1532) O pos  Genetic Screen declned Antibody:Negative (07/19 1532)  Anatomic US Normal F/u A pericardial effusion is visualized adjacent to the right and left ventricles measuring 6mm. Possible bilateral ventricular hypertrophy and cardiomegaly. F/U  Rubella: 1.82 (07/19 1532)  GTT Early:               Third trimester:  Glucose, Fasting 65 - 91 mg/dL 69   Glucose, 1 hour 65 - 179 mg/dL 161115   Glucose, 2 hour 65 - 152 mg/dL 83     RPR: Non Reactive (09/12 1120)   Flu vaccine  declined 02/27/17 HBsAg: Negative (07/19 1532)   TDaP vaccine   Declined 02/27/17                       Rhogam:n/a O+ HIV:   NR   Baby Food both                                       WRU:[id number]:Negative (10/26 1146)  Contraception BTL- signed 8/16/ in media file Pap: 12/01/2015  Normal pap, negative HPV (Care Everywhere)  Circumcision yes CF neg  Pediatrician [facility]   Support Person FOB    Prenatal Classes No        No results found for this or any previous visit (from the past 24 hour(s)).  Assessment: Amy Hylysia L Klecker is a 26 y.o. [phone] with an IUP at 7734w1d presenting for labor  Plan: #Labor: expectant management #Pain:  Per request #FWB  Cat 1    CRESENZO-DISHMAN,Raysean Graumann 05/16/2017, 10:22 PM

## 2017-05-17 ENCOUNTER — Inpatient Hospital Stay (HOSPITAL_COMMUNITY): Payer: Medicaid Other | Admitting: Anesthesiology

## 2017-05-17 ENCOUNTER — Encounter (HOSPITAL_COMMUNITY): Payer: Self-pay | Admitting: *Deleted

## 2017-05-17 DIAGNOSIS — Z3A40 40 weeks gestation of pregnancy: Secondary | ICD-10-CM

## 2017-05-17 LAB — CBC
HEMATOCRIT: 33.2 % — AB (ref 36.0–46.0)
Hemoglobin: 10.3 g/dL — ABNORMAL LOW (ref 12.0–15.0)
MCH: 27.3 pg (ref 26.0–34.0)
MCHC: 31 g/dL (ref 30.0–36.0)
MCV: 88.1 fL (ref 78.0–100.0)
Platelets: 223 10*3/uL (ref 150–400)
RBC: 3.77 MIL/uL — ABNORMAL LOW (ref 3.87–5.11)
RDW: 16.6 % — AB (ref 11.5–15.5)
WBC: 11.6 10*3/uL — AB (ref 4.0–10.5)

## 2017-05-17 LAB — TYPE AND SCREEN
ABO/RH(D): O POS
ANTIBODY SCREEN: NEGATIVE

## 2017-05-17 LAB — RPR: RPR Ser Ql: NONREACTIVE

## 2017-05-17 MED ORDER — DOCUSATE SODIUM 100 MG PO CAPS
100.0000 mg | ORAL_CAPSULE | Freq: Two times a day (BID) | ORAL | Status: DC
  Administered 2017-05-17 – 2017-05-18 (×2): 100 mg via ORAL
  Filled 2017-05-17 (×2): qty 1

## 2017-05-17 MED ORDER — DIPHENHYDRAMINE HCL 25 MG PO CAPS: 25.0000 mg | ORAL_CAPSULE | Freq: Four times a day (QID) | ORAL | Status: DC | PRN

## 2017-05-17 MED ORDER — TETANUS-DIPHTH-ACELL PERTUSSIS 5-2.5-18.5 LF-MCG/0.5 IM SUSP: 0.5000 mL | Freq: Once | INTRAMUSCULAR | Status: DC

## 2017-05-17 MED ORDER — METHYLERGONOVINE MALEATE 0.2 MG PO TABS: 0.2000 mg | ORAL_TABLET | ORAL | Status: DC | PRN

## 2017-05-17 MED ORDER — BISACODYL 10 MG RE SUPP
10.0000 mg | Freq: Every day | RECTAL | Status: DC | PRN
Start: 2017-05-17 — End: 2017-05-18

## 2017-05-17 MED ORDER — COCONUT OIL OIL: 1.0000 "application " | TOPICAL_OIL | Status: DC | PRN

## 2017-05-17 MED ORDER — PRENATAL MULTIVITAMIN CH
1.0000 | ORAL_TABLET | Freq: Every day | ORAL | Status: DC
  Administered 2017-05-17 – 2017-05-18 (×2): 1 via ORAL
  Filled 2017-05-17 (×2): qty 1

## 2017-05-17 MED ORDER — MEASLES, MUMPS & RUBELLA VAC ~~LOC~~ INJ
0.5000 mL | INJECTION | Freq: Once | SUBCUTANEOUS | Status: DC
  Filled 2017-05-17: qty 0.5

## 2017-05-17 MED ORDER — SIMETHICONE 80 MG PO CHEW: 80.0000 mg | CHEWABLE_TABLET | ORAL | Status: DC | PRN

## 2017-05-17 MED ORDER — OXYCODONE HCL 5 MG PO TABS: 10.0000 mg | ORAL_TABLET | ORAL | Status: DC | PRN

## 2017-05-17 MED ORDER — METHYLERGONOVINE MALEATE 0.2 MG/ML IJ SOLN: 0.2000 mg | INTRAMUSCULAR | Status: DC | PRN

## 2017-05-17 MED ORDER — IBUPROFEN 600 MG PO TABS
600.0000 mg | ORAL_TABLET | Freq: Four times a day (QID) | ORAL | Status: DC
  Administered 2017-05-17 – 2017-05-18 (×7): 600 mg via ORAL
  Filled 2017-05-17 (×6): qty 1

## 2017-05-17 MED ORDER — DIBUCAINE 1 % RE OINT: 1.0000 "application " | TOPICAL_OINTMENT | RECTAL | Status: DC | PRN

## 2017-05-17 MED ORDER — OXYCODONE HCL 5 MG PO TABS
5.0000 mg | ORAL_TABLET | ORAL | Status: DC | PRN
  Administered 2017-05-17: 5 mg via ORAL
  Filled 2017-05-17: qty 1

## 2017-05-17 MED ORDER — WITCH HAZEL-GLYCERIN EX PADS
1.0000 | MEDICATED_PAD | CUTANEOUS | Status: DC | PRN
Start: 2017-05-17 — End: 2017-05-18

## 2017-05-17 MED ORDER — ACETAMINOPHEN 325 MG PO TABS
650.0000 mg | ORAL_TABLET | ORAL | Status: DC | PRN
  Administered 2017-05-17: 650 mg via ORAL
  Filled 2017-05-17: qty 2

## 2017-05-17 MED ORDER — ZOLPIDEM TARTRATE 5 MG PO TABS: 5.0000 mg | ORAL_TABLET | Freq: Every evening | ORAL | Status: DC | PRN

## 2017-05-17 MED ORDER — BENZOCAINE-MENTHOL 20-0.5 % EX AERO
1.0000 | INHALATION_SPRAY | CUTANEOUS | Status: DC | PRN
Start: 2017-05-17 — End: 2017-05-18
  Administered 2017-05-17: 1 via TOPICAL
  Filled 2017-05-17: qty 56

## 2017-05-17 MED ORDER — FLEET ENEMA 7-19 GM/118ML RE ENEM: 1.0000 | ENEMA | Freq: Every day | RECTAL | Status: DC | PRN

## 2017-05-17 MED ORDER — FERROUS SULFATE 325 (65 FE) MG PO TABS
325.0000 mg | ORAL_TABLET | Freq: Two times a day (BID) | ORAL | Status: DC
  Administered 2017-05-17 – 2017-05-18 (×3): 325 mg via ORAL
  Filled 2017-05-17 (×3): qty 1

## 2017-05-17 MED ORDER — ONDANSETRON HCL 4 MG PO TABS: 4.0000 mg | ORAL_TABLET | ORAL | Status: DC | PRN

## 2017-05-17 MED ORDER — LIDOCAINE HCL (PF) 1 % IJ SOLN
INTRAMUSCULAR | Status: DC | PRN
  Administered 2017-05-17 (×2): 5 mL

## 2017-05-17 MED ORDER — ONDANSETRON HCL 4 MG/2ML IJ SOLN: 4.0000 mg | INTRAMUSCULAR | Status: DC | PRN

## 2017-05-17 NOTE — Anesthesia Procedure Notes (Signed)
Epidural Patient location during procedure: OB  Staffing Anesthesiologist: Montoya Watkin, MD Performed: anesthesiologist   Preanesthetic Checklist Completed: patient identified, site marked, surgical consent, pre-op evaluation, timeout performed, IV checked, risks and benefits discussed and monitors and equipment checked  Epidural Patient position: sitting Prep: DuraPrep Patient monitoring: heart rate, continuous pulse ox and blood pressure Approach: right paramedian Location: L3-L4 Injection technique: LOR saline  Needle:  Needle type: Tuohy  Needle gauge: 17 G Needle length: 9 cm and 9 Needle insertion depth: 5 cm Catheter type: closed end flexible Catheter size: 20 Guage Catheter at skin depth: 9 cm Test dose: negative  Assessment Events: blood not aspirated, injection not painful, no injection resistance, negative IV test and no paresthesia  Additional Notes Patient identified. Risks/Benefits/Options discussed with patient including but not limited to bleeding, infection, nerve damage, paralysis, failed block, incomplete pain control, headache, blood pressure changes, nausea, vomiting, reactions to medication both or allergic, itching and postpartum back pain. Confirmed with bedside nurse the patient's most recent platelet count. Confirmed with patient that they are not currently taking any anticoagulation, have any bleeding history or any family history of bleeding disorders. Patient expressed understanding and wished to proceed. All questions were answered. Sterile technique was used throughout the entire procedure. Please see nursing notes for vital signs. Test dose was given through epidural needle and negative prior to continuing to dose epidural or start infusion. Warning signs of high block given to the patient including shortness of breath, tingling/numbness in hands, complete motor block, or any concerning symptoms with instructions to call for help. Patient was given  instructions on fall risk and not to get out of bed. All questions and concerns addressed with instructions to call with any issues.     

## 2017-05-17 NOTE — Anesthesia Preprocedure Evaluation (Signed)
Anesthesia Evaluation  Patient identified by MRN, date of birth, ID band Patient awake    Reviewed: Allergy & Precautions, H&P , NPO status , Patient's Chart, lab work & pertinent test results  History of Anesthesia Complications Negative for: history of anesthetic complications  Airway Mallampati: II  TM Distance: >3 FB Neck ROM: full    Dental no notable dental hx. (+) Teeth Intact   Pulmonary neg pulmonary ROS,    Pulmonary exam normal breath sounds clear to auscultation       Cardiovascular hypertension, negative cardio ROS Normal cardiovascular exam Rhythm:regular Rate:Normal     Neuro/Psych negative neurological ROS  negative psych ROS   GI/Hepatic negative GI ROS, Neg liver ROS,   Endo/Other  negative endocrine ROS  Renal/GU negative Renal ROS  negative genitourinary   Musculoskeletal   Abdominal   Peds  Hematology negative hematology ROS (+)   Anesthesia Other Findings   Reproductive/Obstetrics (+) Pregnancy                             Anesthesia Physical Anesthesia Plan  ASA: II  Anesthesia Plan: Epidural   Post-op Pain Management:    Induction:   PONV Risk Score and Plan:   Airway Management Planned:   Additional Equipment:   Intra-op Plan:   Post-operative Plan:   Informed Consent: I have reviewed the patients History and Physical, chart, labs and discussed the procedure including the risks, benefits and alternatives for the proposed anesthesia with the patient or authorized representative who has indicated his/her understanding and acceptance.     Plan Discussed with:   Anesthesia Plan Comments:         Anesthesia Quick Evaluation  

## 2017-05-17 NOTE — Anesthesia Postprocedure Evaluation (Signed)
Anesthesia Post Note  Patient: Amy Mclean  Procedure(s) Performed: AN AD HOC LABOR EPIDURAL     Patient location during evaluation: Mother Baby Anesthesia Type: Epidural Level of consciousness: awake and alert, oriented and patient cooperative Pain management: pain level controlled Vital Signs Assessment: post-procedure vital signs reviewed and stable Respiratory status: spontaneous breathing Cardiovascular status: stable Postop Assessment: no headache, epidural receding, patient able to bend at knees and no signs of nausea or vomiting Anesthetic complications: no Comments: Pain score 2.    Last Vitals:  Vitals:   05/17/17 0314 05/17/17 0425  BP: 111/83 110/79  Pulse: 91 100  Resp: 16 14  Temp: 36.8 C 36.9 C  SpO2: 100% 98%    Last Pain:  Vitals:   05/17/17 0800  TempSrc:   PainSc: 3    Pain Goal: Patients Stated Pain Goal: 5 (05/17/17 0003)               Merrilyn PumaWRINKLE,[name]

## 2017-05-18 MED ORDER — IBUPROFEN 800 MG PO TABS: 800.0000 mg | ORAL_TABLET | Freq: Three times a day (TID) | ORAL | 2 refills | Status: DC | PRN

## 2017-05-18 NOTE — Progress Notes (Signed)
Mom would like to be d/c'd now. Rolitta called again regarding discharge orders.  Notified to call back in 15 min for orders.

## 2017-05-18 NOTE — Discharge Summary (Signed)
OB Discharge Summary     Patient Name: Amy Mclean DOB: Oct 11, 1990 MRN: [id number]  Date of admission: 05/16/2017 Delivering MD: Jacklyn ShellRESENZO-DISHMON, FRANCES   Date of discharge: 05/18/2017  Admitting diagnosis: 40wks CTX 5mins and pressure Intrauterine pregnancy: 4610w2d     Secondary diagnosis:  Active Problems:   Normal labor  Additional problems: none     Discharge diagnosis: Term Pregnancy Delivered                                                                                                Post partum procedures:none  Augmentation: AROM  Complications: None  Hospital course:  Onset of Labor With Vaginal Delivery     26 y.o. yo [id number] at 6510w2d was admitted in Active Labor on 05/16/2017. Patient had an uncomplicated labor course as follows:  Membrane Rupture Time/Date: 1:00 AM ,05/17/2017   Intrapartum Procedures: Episiotomy: None [1]                                         Lacerations:  None [1]  Patient had a delivery of a Viable infant. 05/17/2017  Information for the patient's newborn:  Ezequiel KayserWilliams, Boy Cheria [[id number]][[id number]]  Delivery Method: Vag-Spont    Pateint had an uncomplicated postpartum course.  She is ambulating, tolerating a regular diet, passing flatus, and urinating well. Patient is discharged home in stable condition on 05/18/17.   Physical exam  Vitals:   05/17/17 0245 05/17/17 0314 05/17/17 0425 05/17/17 1859  BP: 119/88 111/83 110/79 108/80  Pulse: 87 91 100 90  Resp: 16 16 14 18   Temp:  98.2 F (36.8 C) 98.4 F (36.9 C) 98.1 F (36.7 C)  TempSrc:  Oral Oral Oral  SpO2:  100% 98%   Weight:      Height:       General: alert, cooperative and no distress Lochia: appropriate Uterine Fundus: firm Incision: N/A DVT Evaluation: No evidence of DVT seen on physical exam. No cords or calf tenderness. No significant calf/ankle edema. Labs: Lab Results  Component Value Date   WBC 11.6 (H) 05/16/2017   HGB 10.3 (L) 05/16/2017   HCT 33.2 (L)  05/16/2017   MCV 88.1 05/16/2017   PLT 223 05/16/2017   CMP Latest Ref Rng & Units 10/05/2014  Glucose 70 - 99 mg/dL 82  BUN 6 - 23 mg/dL 9  Creatinine 1.300.50 - 8.651.10 mg/dL 7.840.62  Sodium 696135 - 295145 mmol/L 138  Potassium 3.5 - 5.1 mmol/L 3.4(L)  Chloride 96 - 112 mmol/L 105  CO2 19 - 32 mmol/L 23  Calcium 8.4 - 10.5 mg/dL 9.6  Total Protein 6.0 - 8.3 g/dL -  Total Bilirubin 0.3 - 1.2 mg/dL -  Alkaline Phos 39 - 284117 U/L -  AST 0 - 37 U/L -  ALT 0 - 35 U/L -    Discharge instruction: per After Visit Summary and "Baby and Me Booklet".  After visit meds:  Allergies as of 05/18/2017      Reactions  No Known Allergies       Medication List    STOP taking these medications   COMFORT FIT MATERNITY SUPP MED Misc     TAKE these medications   ferrous sulfate 325 (65 FE) MG tablet Commonly known as:  FERROUSUL Take 1 tablet (325 mg total) by mouth 2 (two) times daily.   ibuprofen 800 MG tablet Commonly known as:  ADVIL,MOTRIN Take 1 tablet (800 mg total) by mouth every 8 (eight) hours as needed.       Diet: routine diet  Activity: Advance as tolerated. Pelvic rest for 6 weeks.   Outpatient follow up:4 weeks Follow up Appt: Future Appointments  Date Time Provider Department Center  06/14/2017  1:15 PM Adam PhenixArnold, James G, MD [facility] None   Follow up Visit:No Follow-up on file.  Postpartum contraception: Abstinence and interval BTL planned  Newborn Data: Live born female  Birth Weight: 6 lb 13.2 oz (3096 g) APGAR: 9, 9  Newborn Delivery   Birth date/time:  05/17/2017 01:23:00 Delivery type:  Vaginal, Spontaneous     Baby Feeding: Bottle Disposition:home with mother   05/18/2017 Roe Coombsachelle A Tequita Marrs, CNM

## 2017-05-18 NOTE — Progress Notes (Signed)
Post Partum Day #1 Subjective: no complaints, up ad lib, voiding, tolerating PO and + flatus  Objective: Blood pressure 108/80, pulse 90, temperature 98.1 F (36.7 C), temperature source Oral, resp. rate 18, height 5\' 7"  (1.702 m), weight 151 lb (68.5 kg), last menstrual period 06/16/2016, SpO2 98 %, unknown if currently breastfeeding.  Physical Exam:  General: alert, cooperative and no distress Lochia: appropriate Uterine Fundus: firm Incision: none DVT Evaluation: No evidence of DVT seen on physical exam. No cords or calf tenderness. No significant calf/ankle edema.  Recent Labs    05/16/17 2350  HGB 10.3*  HCT 33.2*    Assessment/Plan: Discharge home and Contraception interval BTL   LOS: 2 days   Amy Mclean, CNM 05/18/2017, 6:19 AM

## 2017-05-18 NOTE — Progress Notes (Signed)
MOB states she wants to stay another day. Would like d/c order cancelled.  Raelyn Moraolitta Dawson, CNM, ok to cancel discharge.  Discharge cancelled.

## 2017-05-20 ENCOUNTER — Encounter (HOSPITAL_COMMUNITY): Payer: Self-pay

## 2017-05-21 ENCOUNTER — Encounter: Payer: Medicaid Other | Admitting: Certified Nurse Midwife

## 2017-05-22 ENCOUNTER — Inpatient Hospital Stay (HOSPITAL_COMMUNITY): Payer: Medicaid Other

## 2017-05-28 ENCOUNTER — Other Ambulatory Visit: Payer: Self-pay | Admitting: Obstetrics & Gynecology

## 2017-06-14 ENCOUNTER — Encounter: Payer: Self-pay | Admitting: Obstetrics & Gynecology

## 2017-06-14 ENCOUNTER — Ambulatory Visit (INDEPENDENT_AMBULATORY_CARE_PROVIDER_SITE_OTHER): Payer: Medicaid Other | Admitting: Obstetrics & Gynecology

## 2017-06-14 DIAGNOSIS — Z1389 Encounter for screening for other disorder: Secondary | ICD-10-CM | POA: Diagnosis not present

## 2017-06-14 NOTE — Progress Notes (Signed)
Post Partum Exam  Amy Mclean is a 26 y.o. [phone] female who presents for a postpartum visit. She is 4 weeks postpartum following a spontaneous vaginal delivery. I have fully reviewed the prenatal and intrapartum course. The delivery was at 4437w1d gestational weeks.  Anesthesia: epidural. Postpartum course has been unremarkable. Baby's course has been unremarkable. Baby is feeding by bottle Amy Mclean- Gerber. Bleeding no bleeding. Bowel function is normal. Bladder function is normal. Patient is not sexually active. Contraception method is tubal ligation. Postpartum depression screening:neg EPDS: 4   The following portions of the patient's history were reviewed and updated as appropriate: allergies, current medications, past family history, past medical history, past social history, past surgical history and problem list.  Review of Systems Pertinent items are noted in HPI.    Objective:  unknown if currently breastfeeding.  General:  alert, cooperative and no distress           Abdomen: soft, non-tender; bowel sounds normal; no masses,  no organomegaly   Vulva:  not evaluated  Vagina: not evaluated           Rectal Exam: Not performed.        Assessment:    normal postpartum exam. Pap smear not done at today's visit.   Plan:   1. Contraception: tubal ligation 2. Surgery scheduled for 07/2017, will abstain until then 3. Follow up in as needed.   Adam PhenixArnold, Rebekah Zackery G, MD 06/14/2017

## 2017-06-14 NOTE — Patient Instructions (Signed)
Laparoscopic Tubal Ligation, Care After °Refer to this sheet in the next few weeks. These instructions provide you with information about caring for yourself after your procedure. Your health care provider may also give you more specific instructions. Your treatment has been planned according to current medical practices, but problems sometimes occur. Call your health care provider if you have any problems or questions after your procedure. °What can I expect after the procedure? °After the procedure, it is common to have: °· A sore throat. °· Discomfort in your shoulder. °· Mild discomfort or cramping in your abdomen. °· Gas pains. °· Pain or soreness in the area where the surgical cut (incision) was made. °· A bloated feeling. °· Tiredness. °· Nausea. °· Vomiting. ° °Follow these instructions at home: °Medicines °· Take over-the-counter and prescription medicines only as told by your health care provider. °· Do not take aspirin because it can cause bleeding. °· Do not drive or operate heavy machinery while taking prescription pain medicine. °Activity °· Rest for the rest of the day. °· Return to your normal activities as told by your health care provider. Ask your health care provider what activities are safe for you. °Incision care ° °· Follow instructions from your health care provider about how to take care of your incision. Make sure you: °? Wash your hands with soap and water before you change your bandage (dressing). If soap and water are not available, use hand sanitizer. °? Change your dressing as told by your health care provider. °? Leave stitches (sutures) in place. They may need to stay in place for 2 weeks or longer. °· Check your incision area every day for signs of infection. Check for: °? More redness, swelling, or pain. °? More fluid or blood. °? Warmth. °? Pus or a bad smell. °Other Instructions °· Do not take baths, swim, or use a hot tub until your health care provider approves. You may take  showers. °· Keep all follow-up visits as told by your health care provider. This is important. °· Have someone help you with your daily household tasks for the first few days. °Contact a health care provider if: °· You have more redness, swelling, or pain around your incision. °· Your incision feels warm to the touch. °· You have pus or a bad smell coming from your incision. °· The edges of your incision break open after the sutures have been removed. °· Your pain does not improve after 2-3 days. °· You have a rash. °· You repeatedly become dizzy or light-headed. °· Your pain medicine is not helping. °· You are constipated. °Get help right away if: °· You have a fever. °· You faint. °· You have increasing pain in your abdomen. °· You have severe pain in one or both of your shoulders. °· You have fluid or blood coming from your sutures or from your vagina. °· You have shortness of breath or difficulty breathing. °· You have chest pain or leg pain. °· You have ongoing nausea, vomiting, or diarrhea. °This information is not intended to replace advice given to you by your health care provider. Make sure you discuss any questions you have with your health care provider. °Document Released: 12/22/2004 Document Revised: 11/07/2015 Document Reviewed: 05/15/2015 °Elsevier Interactive Patient Education © 2018 [employer] ° °

## 2017-07-19 ENCOUNTER — Ambulatory Visit (INDEPENDENT_AMBULATORY_CARE_PROVIDER_SITE_OTHER): Payer: Medicaid Other

## 2017-07-19 ENCOUNTER — Other Ambulatory Visit (HOSPITAL_COMMUNITY)
Admission: RE | Admit: 2017-07-19 | Discharge: 2017-07-19 | Disposition: A | Discharge status: Home / Self Care | Payer: Medicaid Other | Source: Ambulatory Visit

## 2017-07-19 VITALS — BP 122/80 | HR 84 | Wt 136.0 lb

## 2017-07-19 DIAGNOSIS — B9689 Other specified bacterial agents as the cause of diseases classified elsewhere: Secondary | ICD-10-CM | POA: Diagnosis not present

## 2017-07-19 DIAGNOSIS — N760 Acute vaginitis: Secondary | ICD-10-CM

## 2017-07-19 DIAGNOSIS — N898 Other specified noninflammatory disorders of vagina: Secondary | ICD-10-CM | POA: Insufficient documentation

## 2017-07-19 MED ORDER — METRONIDAZOLE 500 MG PO TABS: 500.0000 mg | ORAL_TABLET | Freq: Two times a day (BID) | ORAL | 0 refills | Status: DC

## 2017-07-19 NOTE — Patient Instructions (Signed)
Bacterial Vaginosis Bacterial vaginosis is a vaginal infection that occurs when the normal balance of bacteria in the vagina is disrupted. It results from an overgrowth of certain bacteria. This is the most common vaginal infection among women ages 15-44. Because bacterial vaginosis increases your risk for STIs (sexually transmitted infections), getting treated can help reduce your risk for chlamydia, gonorrhea, herpes, and HIV (human immunodeficiency virus). Treatment is also important for preventing complications in pregnant women, because this condition can cause an early (premature) delivery. What are the causes? This condition is caused by an increase in harmful bacteria that are normally present in small amounts in the vagina. However, the reason that the condition develops is not fully understood. What increases the risk? The following factors may make you more likely to develop this condition:  Having a new sexual partner or multiple sexual partners.  Having unprotected sex.  Douching.  Having an intrauterine device (IUD).  Smoking.  Drug and alcohol abuse.  Taking certain antibiotic medicines.  Being pregnant.  You cannot get bacterial vaginosis from toilet seats, bedding, swimming pools, or contact with objects around you. What are the signs or symptoms? Symptoms of this condition include:  Grey or white vaginal discharge. The discharge can also be watery or foamy.  A fish-like odor with discharge, especially after sexual intercourse or during menstruation.  Itching in and around the vagina.  Burning or pain with urination.  Some women with bacterial vaginosis have no signs or symptoms. How is this diagnosed? This condition is diagnosed based on:  Your medical history.  A physical exam of the vagina.  Testing a sample of vaginal fluid under a microscope to look for a large amount of bad bacteria or abnormal cells. Your health care provider may use a cotton swab  or a small wooden spatula to collect the sample.  How is this treated? This condition is treated with antibiotics. These may be given as a pill, a vaginal cream, or a medicine that is put into the vagina (suppository). If the condition comes back after treatment, a second round of antibiotics may be needed. Follow these instructions at home: Medicines  Take over-the-counter and prescription medicines only as told by your health care provider.  Take or use your antibiotic as told by your health care provider. Do not stop taking or using the antibiotic even if you start to feel better. General instructions  If you have a female sexual partner, tell her that you have a vaginal infection. She should see her health care provider and be treated if she has symptoms. If you have a female sexual partner, he does not need treatment.  During treatment: ? Avoid sexual activity until you finish treatment. ? Do not douche. ? Avoid alcohol as directed by your health care provider. ? Avoid breastfeeding as directed by your health care provider.  Drink enough water and fluids to keep your urine clear or pale yellow.  Keep the area around your vagina and rectum clean. ? Wash the area daily with warm water. ? Wipe yourself from front to back after using the toilet.  Keep all follow-up visits as told by your health care provider. This is important. How is this prevented?  Do not douche.  Wash the outside of your vagina with warm water only.  Use protection when having sex. This includes latex condoms and dental dams.  Limit how many sexual partners you have. To help prevent bacterial vaginosis, it is best to have sex with just   one partner (monogamous).  Make sure you and your sexual partner are tested for STIs.  Wear cotton or cotton-lined underwear.  Avoid wearing tight pants and pantyhose, especially during summer.  Limit the amount of alcohol that you drink.  Do not use any products that  contain nicotine or tobacco, such as cigarettes and e-cigarettes. If you need help quitting, ask your health care provider.  Do not use illegal drugs. Where to find more information:  Centers for Disease Control and Prevention: www.cdc.gov/std  American Sexual Health Association (ASHA): www.ashastd.org  U.S. Department of Health and Human Services, Office on Women's Health: www.womenshealth.gov/ or https://www.womenshealth.gov/a-z-topics/bacterial-vaginosis Contact a health care provider if:  Your symptoms do not improve, even after treatment.  You have more discharge or pain when urinating.  You have a fever.  You have pain in your abdomen.  You have pain during sex.  You have vaginal bleeding between periods. Summary  Bacterial vaginosis is a vaginal infection that occurs when the normal balance of bacteria in the vagina is disrupted.  Because bacterial vaginosis increases your risk for STIs (sexually transmitted infections), getting treated can help reduce your risk for chlamydia, gonorrhea, herpes, and HIV (human immunodeficiency virus). Treatment is also important for preventing complications in pregnant women, because the condition can cause an early (premature) delivery.  This condition is treated with antibiotic medicines. These may be given as a pill, a vaginal cream, or a medicine that is put into the vagina (suppository). This information is not intended to replace advice given to you by your health care provider. Make sure you discuss any questions you have with your health care provider. Document Released: 06/04/2005 Document Revised: 10/08/2016 Document Reviewed: 02/18/2016 Elsevier Interactive Patient Education  2018 [employer]  

## 2017-07-19 NOTE — Progress Notes (Signed)
History:  [name] is a 27 y.o. [id number] who presents to clinic today with a vaginal discharge with an odor. She reports it has been ongoing for 2 weeks. Has a hx of bacterial vaginosis infections in the past and feels like this is the same  The following portions of the patient's history were reviewed and updated as appropriate: allergies, current medications, family history, past medical history, social history, past surgical history and problem list.  Review of Systems:  Review of Systems  Constitutional: Negative.  Negative for chills and fever.  Respiratory: Negative.   Cardiovascular: Negative.  Negative for chest pain.  Genitourinary: Negative.        Vaginal discharge  Neurological: Negative.  Negative for dizziness and headaches.      Objective:  Physical Exam BP 122/80   Pulse 84   Wt 136 lb (61.7 kg)   LMP 06/24/2017   Breastfeeding? No   BMI 21.30 kg/m  Physical Exam  Constitutional: She is oriented to person, place, and time. She appears well-developed and well-nourished. No distress.  HENT:  Head: Normocephalic.  Eyes: Pupils are equal, round, and reactive to light.  Neck: Normal range of motion.  Cardiovascular: Normal rate and regular rhythm.  Pulmonary/Chest: Effort normal and breath sounds normal. No respiratory distress.  Abdominal: Soft. There is no tenderness.  Genitourinary: Vagina normal.  Musculoskeletal: Normal range of motion.  Neurological: She is alert and oriented to person, place, and time.  Skin: Skin is warm and dry.  Psychiatric: She has a normal mood and affect. Her behavior is normal. Judgment and thought content normal.  Nursing note and vitals reviewed.   Assessment & Plan:  1. Vaginal discharge - Suspected bacterial vaginosis, will send metronidazole to patient's pharmacy - BV prevention measures discussed - Cervicovaginal ancillary only  -Follow up as needed for gyn care.   Amy Mclean, Amy Mclean M, [place] 07/19/2017 9:58  AM

## 2017-07-19 NOTE — Progress Notes (Signed)
Patient is in the office for possible bv, reports discharge and odor.

## 2017-07-22 LAB — CERVICOVAGINAL ANCILLARY ONLY: Bacterial vaginitis: POSITIVE — AB

## 2017-08-06 ENCOUNTER — Encounter (HOSPITAL_BASED_OUTPATIENT_CLINIC_OR_DEPARTMENT_OTHER): Payer: Self-pay | Admitting: *Deleted

## 2017-08-06 ENCOUNTER — Other Ambulatory Visit: Payer: Self-pay

## 2017-08-12 ENCOUNTER — Encounter: Payer: Self-pay | Admitting: Family Medicine

## 2017-08-12 ENCOUNTER — Ambulatory Visit (INDEPENDENT_AMBULATORY_CARE_PROVIDER_SITE_OTHER): Payer: Medicaid Other | Admitting: General Practice

## 2017-08-12 DIAGNOSIS — Z3202 Encounter for pregnancy test, result negative: Secondary | ICD-10-CM

## 2017-08-12 LAB — POCT PREGNANCY, URINE: Preg Test, Ur: NEGATIVE

## 2017-08-12 NOTE — Progress Notes (Signed)
Patient here for UPT today. UPT -. Patient states she was told in come in for a negative UPT prior to her BTL surgery. LMP 07/22/17. Patient had no questions

## 2017-08-13 ENCOUNTER — Encounter (HOSPITAL_BASED_OUTPATIENT_CLINIC_OR_DEPARTMENT_OTHER): Payer: Self-pay | Admitting: Anesthesiology

## 2017-08-13 NOTE — Anesthesia Preprocedure Evaluation (Deleted)
Anesthesia Evaluation  Patient identified by MRN, date of birth, ID band Patient awake    Reviewed: Allergy & Precautions, NPO status , Patient's Chart, lab work & pertinent test results  Airway        Dental   Pulmonary neg pulmonary ROS,           Cardiovascular hypertension,      Neuro/Psych Anxiety negative neurological ROS     GI/Hepatic negative GI ROS, Neg liver ROS,   Endo/Other  negative endocrine ROS  Renal/GU negative Renal ROS     Musculoskeletal negative musculoskeletal ROS (+)   Abdominal   Peds  Hematology negative hematology ROS (+)   Anesthesia Other Findings Undesired Fertility  Reproductive/Obstetrics hcg negative                             Anesthesia Physical Anesthesia Plan  ASA: II  Anesthesia Plan: General   Post-op Pain Management:    Induction: Intravenous  PONV Risk Score and Plan: 3 and Midazolam, Scopolamine patch - Pre-op, Ondansetron, Dexamethasone and Treatment may vary due to age or medical condition  Airway Management Planned: Oral ETT  Additional Equipment:   Intra-op Plan:   Post-operative Plan: Extubation in OR  Informed Consent: I have reviewed the patients History and Physical, chart, labs and discussed the procedure including the risks, benefits and alternatives for the proposed anesthesia with the patient or authorized representative who has indicated his/her understanding and acceptance.   Dental advisory given  Plan Discussed with: CRNA  Anesthesia Plan Comments:         Anesthesia Quick Evaluation

## 2017-08-14 ENCOUNTER — Ambulatory Visit (HOSPITAL_BASED_OUTPATIENT_CLINIC_OR_DEPARTMENT_OTHER)
Admission: RE | Admit: 2017-08-14 | Payer: Medicaid Other | Source: Ambulatory Visit | Admitting: Obstetrics & Gynecology

## 2017-08-14 HISTORY — DX: Anxiety disorder, unspecified: F41.9

## 2017-08-14 SURGERY — LIGATION, FALLOPIAN TUBE, LAPAROSCOPIC
Anesthesia: Choice | Laterality: Bilateral

## 2017-08-14 MED ORDER — ROCURONIUM BROMIDE 10 MG/ML (PF) SYRINGE
PREFILLED_SYRINGE | INTRAVENOUS | Status: AC
  Filled 2017-08-14: qty 5

## 2017-08-14 MED ORDER — MIDAZOLAM HCL 2 MG/2ML IJ SOLN
INTRAMUSCULAR | Status: AC
  Filled 2017-08-14: qty 2

## 2017-08-14 MED ORDER — NEOSTIGMINE METHYLSULFATE 5 MG/5ML IV SOSY
PREFILLED_SYRINGE | INTRAVENOUS | Status: AC
  Filled 2017-08-14: qty 5

## 2017-08-14 MED ORDER — FENTANYL CITRATE (PF) 100 MCG/2ML IJ SOLN
INTRAMUSCULAR | Status: AC
  Filled 2017-08-14: qty 2

## 2017-08-14 MED ORDER — DEXAMETHASONE SODIUM PHOSPHATE 10 MG/ML IJ SOLN
INTRAMUSCULAR | Status: AC
  Filled 2017-08-14: qty 1

## 2017-08-14 MED ORDER — LIDOCAINE 2% (20 MG/ML) 5 ML SYRINGE
INTRAMUSCULAR | Status: AC
  Filled 2017-08-14: qty 5

## 2017-08-14 MED ORDER — ONDANSETRON HCL 4 MG/2ML IJ SOLN
INTRAMUSCULAR | Status: AC
  Filled 2017-08-14: qty 2

## 2017-08-14 MED ORDER — PROPOFOL 10 MG/ML IV BOLUS
INTRAVENOUS | Status: AC
  Filled 2017-08-14: qty 40

## 2017-08-26 ENCOUNTER — Other Ambulatory Visit (HOSPITAL_COMMUNITY): Payer: Self-pay | Admitting: General Surgery

## 2017-08-26 DIAGNOSIS — R1310 Dysphagia, unspecified: Secondary | ICD-10-CM

## 2017-08-29 ENCOUNTER — Ambulatory Visit (HOSPITAL_COMMUNITY): Admission: RE | Admit: 2017-08-29 | Payer: Medicaid Other | Source: Ambulatory Visit

## 2017-09-03 ENCOUNTER — Ambulatory Visit (HOSPITAL_COMMUNITY)
Admission: RE | Admit: 2017-09-03 | Discharge: 2017-09-03 | Disposition: A | Discharge status: Home / Self Care | Payer: Medicaid Other | Source: Ambulatory Visit | Attending: General Surgery | Admitting: General Surgery

## 2017-09-03 DIAGNOSIS — R1310 Dysphagia, unspecified: Secondary | ICD-10-CM | POA: Diagnosis not present

## 2017-09-16 ENCOUNTER — Ambulatory Visit: Payer: Self-pay | Admitting: General Surgery

## 2017-09-18 ENCOUNTER — Encounter (HOSPITAL_COMMUNITY): Payer: Self-pay

## 2017-09-18 NOTE — Patient Instructions (Signed)
Your procedure is scheduled on: Tuesday, September 24, 2017   Surgery Time:  7:30AM-8:30AM   [facility]   Arrive by 5:30 AM pick up the phone at the front desk dial [phone], have a seat in the lobby and a staff member will come and escort you to Short Stay Department   Call this number if you have problems the morning of surgery [phone]   Do not eat food:After Midnight.   Do NOT smoke after Midnight   Only clear liquids after midnight. Nothing at all in mouth (no gum, candy, or mints)  3 hours prior to scheduled surgery after 4:30AM.    Complete one Ensure drink the morning of surgery by 4:30AM the day of surgery.   Take these medicines the morning of surgery with A SIP OF WATER: None                               You may not have any metal on your body including hair pins, jewelry, and body piercings             Do not wear make-up, lotions, powders, perfumes/cologne, or deodorant             Do not wear nail polish.  Do not shave  48 hours prior to surgery.              Do not bring valuables to the hospital. [facility] IS NOT             RESPONSIBLE   FOR VALUABLES.   Contacts, dentures or bridgework may not be worn into surgery.   Patients discharged the day of surgery will not be allowed to drive home.   Special Instructions: Bring a copy of your healthcare power of attorney and living will documents         the day of surgery if you haven't scanned them in before.              Please read over the following fact sheets you were given:  [facility] Before surgery, you can play an important role.  Because skin is not sterile, your skin needs to be as free of germs as possible.  You can reduce the number of germs on your skin by washing with CHG (chlorahexidine gluconate) soap before surgery.  CHG is an antiseptic cleaner which kills germs and bonds with the skin to continue killing germs even after  washing. Please DO NOT use if you have an allergy to CHG or antibacterial soaps.  If your skin becomes reddened/irritated stop using the CHG and inform your nurse when you arrive at Short Stay. Do not shave (including legs and underarms) for at least 48 hours prior to the first CHG shower.  You may shave your face/neck.  Please follow these instructions carefully:  1.  Shower with CHG Soap the night before surgery and the  morning of surgery.  2.  If you choose to wash your hair, wash your hair first as usual with your normal  shampoo.  3.  After you shampoo, rinse your hair and body thoroughly to remove the shampoo.                             4.  Use CHG as you would any other liquid soap.  You can apply chg directly to the skin and wash.  Gently with a scrungie or clean washcloth.  5.  Apply the CHG Soap to your body ONLY FROM THE NECK DOWN.   Do   not use on face/ open                           Wound or open sores. Avoid contact with eyes, ears mouth and   genitals (private parts).                       Wash face,  Genitals (private parts) with your normal soap.             6.  Wash thoroughly, paying special attention to the area where your    surgery  will be performed.  7.  Thoroughly rinse your body with warm water from the neck down.  8.  DO NOT shower/wash with your normal soap after using and rinsing off the CHG Soap.                9.  Pat yourself dry with a clean towel.            10.  Wear clean pajamas.            11.  Place clean sheets on your bed the night of your first shower and do not  sleep with pets. Day of Surgery : Do not apply any lotions/deodorants the morning of surgery.  Please wear clean clothes to the hospital/surgery center.  FAILURE TO FOLLOW THESE INSTRUCTIONS MAY RESULT IN THE CANCELLATION OF YOUR SURGERY  PATIENT SIGNATURE_________________________________  NURSE  SIGNATURE__________________________________  ________________________________________________________________________

## 2017-09-19 ENCOUNTER — Inpatient Hospital Stay (HOSPITAL_COMMUNITY)
Admission: RE | Admit: 2017-09-19 | Discharge: 2017-09-19 | Disposition: A | Discharge status: Home / Self Care | Payer: Medicaid Other | Source: Ambulatory Visit

## 2017-09-19 HISTORY — DX: Unspecified ovarian cyst, left side: N83.202

## 2017-09-19 HISTORY — DX: Personal history of other diseases of the digestive system: Z87.19

## 2017-09-19 HISTORY — DX: Personal history of other specified conditions: Z87.898

## 2017-09-24 ENCOUNTER — Ambulatory Visit (HOSPITAL_COMMUNITY): Admission: RE | Admit: 2017-09-24 | Payer: Medicaid Other | Source: Ambulatory Visit | Admitting: General Surgery

## 2017-09-24 ENCOUNTER — Encounter (HOSPITAL_COMMUNITY): Admission: RE | Payer: Self-pay | Source: Ambulatory Visit

## 2017-09-24 SURGERY — REPAIR, HERNIA, UMBILICAL, ADULT
Anesthesia: General

## 2017-10-03 ENCOUNTER — Ambulatory Visit: Payer: Medicaid Other | Admitting: Certified Nurse Midwife

## 2017-10-03 ENCOUNTER — Encounter: Payer: Self-pay | Admitting: Certified Nurse Midwife

## 2017-10-03 ENCOUNTER — Other Ambulatory Visit (HOSPITAL_COMMUNITY)
Admission: RE | Admit: 2017-10-03 | Discharge: 2017-10-03 | Disposition: A | Discharge status: Home / Self Care | Payer: Medicaid Other | Source: Ambulatory Visit | Attending: Certified Nurse Midwife | Admitting: Certified Nurse Midwife

## 2017-10-03 ENCOUNTER — Encounter: Payer: Self-pay | Admitting: Obstetrics

## 2017-10-03 VITALS — BP 102/70 | HR 103 | Wt 134.4 lb

## 2017-10-03 DIAGNOSIS — A599 Trichomoniasis, unspecified: Secondary | ICD-10-CM

## 2017-10-03 DIAGNOSIS — N898 Other specified noninflammatory disorders of vagina: Secondary | ICD-10-CM

## 2017-10-03 DIAGNOSIS — B9689 Other specified bacterial agents as the cause of diseases classified elsewhere: Secondary | ICD-10-CM | POA: Diagnosis not present

## 2017-10-03 DIAGNOSIS — B373 Candidiasis of vulva and vagina: Secondary | ICD-10-CM | POA: Diagnosis not present

## 2017-10-03 DIAGNOSIS — N760 Acute vaginitis: Secondary | ICD-10-CM

## 2017-10-03 DIAGNOSIS — B3731 Acute candidiasis of vulva and vagina: Secondary | ICD-10-CM

## 2017-10-03 MED ORDER — SECNIDAZOLE 2 G PO PACK: 1.0000 | PACK | Freq: Once | ORAL | 0 refills | Status: AC

## 2017-10-03 MED ORDER — METRONIDAZOLE 0.75 % VA GEL
1.0000 | Freq: Two times a day (BID) | VAGINAL | 0 refills | Status: DC
Start: 2017-10-03 — End: 2017-11-22

## 2017-10-03 NOTE — Progress Notes (Signed)
RGYN patient presents for problem visit today.  CC: possible BV Infection.  Pt states she notices odor and discharge. Pt states she was unable to take last medication that was prescribed for BV. (metronidazole) pt prefers vaginal method for Treatment.

## 2017-10-04 LAB — CERVICOVAGINAL ANCILLARY ONLY
Bacterial vaginitis: POSITIVE — AB
CANDIDA VAGINITIS: POSITIVE — AB
CHLAMYDIA, DNA PROBE: NEGATIVE
Neisseria Gonorrhea: NEGATIVE
Trichomonas: POSITIVE — AB

## 2017-10-07 ENCOUNTER — Encounter: Payer: Self-pay | Admitting: Certified Nurse Midwife

## 2017-10-07 MED ORDER — TERCONAZOLE 0.8 % VA CREA: 1.0000 | TOPICAL_CREAM | Freq: Every day | VAGINAL | 0 refills | Status: DC

## 2017-10-07 MED ORDER — TINIDAZOLE 500 MG PO TABS: 2.0000 g | ORAL_TABLET | Freq: Every day | ORAL | 0 refills | Status: DC

## 2017-10-07 MED ORDER — FLUCONAZOLE 200 MG PO TABS: 200.0000 mg | ORAL_TABLET | Freq: Once | ORAL | 0 refills | Status: AC

## 2017-10-07 NOTE — Progress Notes (Signed)
Patient ID: Amy Mclean, female   DOB: 19-Dec-1990, 27 y.o.   MRN: [id number]  Chief Complaint  Patient presents with  . Vaginitis    HPI Amy Mclean is a 27 y.o. female.  Reports change in vaginal discharge 3 weeks ago.  Unknown if currently sexually active.  Cancelled BTL 08/07/2017.  Needs umbilical hernia repair.   HPI  Past Medical History:  Diagnosis Date  . Adnexal mass 12/30/2015  . Anemia   . Anxiety   . Chlamydia   . High blood pressure   . History of dysphagia   . History of severe postpartum preeclampsia in prior pregnancy, currently pregnant 09/01/2014  . Hx of varicella   . Left ovarian cyst   . Trichomonas vaginitis 2017  . Urinary tract infection     Past Surgical History:  Procedure Laterality Date  . LAPAROSCOPY N/A 12/30/2015   Procedure: LAPAROSCOPY DIAGNOSTIC;  Surgeon: Sherian ReinJody Bovard-Stuckert, MD;  Location: [facility];  Service: Gynecology;  Laterality: N/A;    Family History  Problem Relation Age of Onset  . Hypertension Father   . Hypertension Maternal Grandmother   . Alcohol abuse Neg Hx   . Arthritis Neg Hx   . Birth defects Neg Hx   . Asthma Neg Hx   . Cancer Neg Hx   . COPD Neg Hx   . Depression Neg Hx   . Diabetes Neg Hx   . Drug abuse Neg Hx   . Early death Neg Hx   . Hearing loss Neg Hx   . Heart disease Neg Hx   . Hyperlipidemia Neg Hx   . Kidney disease Neg Hx   . Learning disabilities Neg Hx   . Mental illness Neg Hx   . Mental retardation Neg Hx   . Miscarriages / Stillbirths Neg Hx   . Vision loss Neg Hx   . Stroke Neg Hx     Social History Social History   Tobacco Use  . Smoking status: Never Smoker  . Smokeless tobacco: Never Used  Substance Use Topics  . Alcohol use: No  . Drug use: No    Allergies  Allergen Reactions  . No Known Allergies     Current Outpatient Medications  Medication Sig Dispense Refill  . ferrous sulfate (FERROUSUL) 325 (65 FE) MG tablet Take 1 tablet (325 mg total) by mouth 2 (two)  times daily. (Patient not taking: Reported on 10/03/2017) 60 tablet 1  . metroNIDAZOLE (METROGEL VAGINAL) 0.75 % vaginal gel Place 1 Applicatorful vaginally 2 (two) times daily. 70 g 0  . Multiple Vitamin (MULTIVITAMIN WITH MINERALS) TABS tablet Take 1 tablet by mouth daily.     No current facility-administered medications for this visit.     Review of Systems Review of Systems Constitutional: negative for fatigue and weight loss Respiratory: negative for cough and wheezing Cardiovascular: negative for chest pain, fatigue and palpitations Gastrointestinal: negative for abdominal pain and change in bowel habits Genitourinary:negative Integument/breast: negative for nipple discharge Musculoskeletal:negative for myalgias Neurological: negative for gait problems and tremors Behavioral/Psych: negative for abusive relationship, depression Endocrine: negative for temperature intolerance      Blood pressure 102/70, pulse (!) 103, weight 134 lb 6.4 oz (61 kg), last menstrual period 09/03/2017, not currently breastfeeding.  Physical Exam Physical Exam General:   alert  Skin:   no rash or abnormalities  Lungs:   clear to auscultation bilaterally  Heart:   regular rate and rhythm, S1, S2 normal, no murmur, click, rub or  gallop  Breasts:   normal without suspicious masses, skin or nipple changes or axillary nodes  Abdomen:  normal findings: no organomegaly, soft, non-tender and no hernia  Pelvis:  External genitalia: normal general appearance Urinary system: urethral meatus normal and bladder without fullness, nontender Vaginal: normal without tenderness, induration or masses, + thin,gray vaginal discharge, odiferous.      50% of 15 min visit spent on counseling and coordination of care.   Data Reviewed Previous medical hx, meds, labs  Assessment     1. Vaginal discharge    - Cervicovaginal ancillary only  2. BV (bacterial vaginosis)    - metroNIDAZOLE (METROGEL VAGINAL) 0.75 %  vaginal gel; Place 1 Applicatorful vaginally 2 (two) times daily.  Dispense: 70 g; Refill: 0 - Secnidazole (SOLOSEC) 2 g PACK; Take 1 Package by mouth once for 1 dose.  Dispense: 1 each; Refill: 0     Plan    Needs contraception management.   Meds ordered this encounter  Medications  . metroNIDAZOLE (METROGEL VAGINAL) 0.75 % vaginal gel    Sig: Place 1 Applicatorful vaginally 2 (two) times daily.    Dispense:  70 g    Refill:  0  . Secnidazole (SOLOSEC) 2 g PACK    Sig: Take 1 Package by mouth once for 1 dose.    Dispense:  1 each    Refill:  0     Possible management options include: Chronic BV treatment Follow up as needed for contraception/annual exam.

## 2017-10-08 ENCOUNTER — Telehealth: Payer: Self-pay | Admitting: *Deleted

## 2017-10-08 NOTE — Telephone Encounter (Signed)
Pt called to office for clarification of Rx. LM with detail on how to take Rx that were given.

## 2017-11-12 ENCOUNTER — Ambulatory Visit: Payer: Medicaid Other | Admitting: Certified Nurse Midwife

## 2017-11-13 ENCOUNTER — Ambulatory Visit: Payer: Medicaid Other | Admitting: Certified Nurse Midwife

## 2017-11-13 ENCOUNTER — Encounter: Payer: Self-pay | Admitting: Certified Nurse Midwife

## 2017-11-13 ENCOUNTER — Ambulatory Visit (INDEPENDENT_AMBULATORY_CARE_PROVIDER_SITE_OTHER): Payer: Medicaid Other | Admitting: Certified Nurse Midwife

## 2017-11-13 ENCOUNTER — Other Ambulatory Visit (HOSPITAL_COMMUNITY)
Admission: RE | Admit: 2017-11-13 | Discharge: 2017-11-13 | Disposition: A | Discharge status: Home / Self Care | Payer: Medicaid Other | Source: Ambulatory Visit | Attending: Certified Nurse Midwife | Admitting: Certified Nurse Midwife

## 2017-11-13 VITALS — BP 107/65 | HR 92 | Ht 67.0 in | Wt 134.5 lb

## 2017-11-13 DIAGNOSIS — B9689 Other specified bacterial agents as the cause of diseases classified elsewhere: Secondary | ICD-10-CM | POA: Insufficient documentation

## 2017-11-13 DIAGNOSIS — N760 Acute vaginitis: Secondary | ICD-10-CM

## 2017-11-13 DIAGNOSIS — Z862 Personal history of diseases of the blood and blood-forming organs and certain disorders involving the immune mechanism: Secondary | ICD-10-CM

## 2017-11-13 DIAGNOSIS — N3000 Acute cystitis without hematuria: Secondary | ICD-10-CM

## 2017-11-13 DIAGNOSIS — Z30011 Encounter for initial prescription of contraceptive pills: Secondary | ICD-10-CM

## 2017-11-13 DIAGNOSIS — Z202 Contact with and (suspected) exposure to infections with a predominantly sexual mode of transmission: Secondary | ICD-10-CM | POA: Diagnosis not present

## 2017-11-13 LAB — POCT URINALYSIS DIPSTICK
BILIRUBIN UA: NEGATIVE
Glucose, UA: NEGATIVE
PH UA: 7 (ref 5.0–8.0)
Protein, UA: POSITIVE — AB
Spec Grav, UA: 1.01 (ref 1.010–1.025)
UROBILINOGEN UA: 0.2 U/dL

## 2017-11-13 MED ORDER — PHENAZOPYRIDINE HCL 200 MG PO TABS
200.0000 mg | ORAL_TABLET | Freq: Three times a day (TID) | ORAL | 0 refills | Status: DC | PRN
Start: 2017-11-13 — End: 2020-10-11

## 2017-11-13 MED ORDER — CEFIXIME 400 MG PO CAPS: 400.0000 mg | ORAL_CAPSULE | Freq: Every day | ORAL | 0 refills | Status: DC

## 2017-11-13 MED ORDER — NORETHIN-ETH ESTRAD-FE BIPHAS 1 MG-10 MCG / 10 MCG PO TABS: 1.0000 | ORAL_TABLET | Freq: Every day | ORAL | 4 refills | Status: DC

## 2017-11-13 NOTE — Progress Notes (Signed)
Pt is here for UTI symptoms for a couple of weeks. Pt is reporting c/o bladder being full, urine is cloudy with odor, urgency. Pt also states her partner was recently treated for trich.

## 2017-11-13 NOTE — Progress Notes (Signed)
Patient ID: Amy Mclean, female   DOB: 12-Jan-1991, 27 y.o.   MRN: [id number]  Chief Complaint  Patient presents with  . Urinary Tract Infection    HPI Amy Mclean is a 27 y.o. female.  Here for UTI symptoms for 2-3 weeks, became worse last week with odor when urinating.  Denies burning and frequency.  Is currently sexually active, occasionally using condoms.  States her partner recently was dx with Trich.  Desires STD screening today.  Desires to start on OCPs.  Denies problems with her period.  Is bottle feeding infant now. Has been on OCPs in the past.  Denies hx of DVTs personal or family. A[facility]ES reviewed.  Does state that one type of OCP made her nauseated in 2017 but does not remember which one, no records in Epic of what type of OCP that was.    HPI  Past Medical History:  Diagnosis Date  . Adnexal mass 12/30/2015  . Anemia   . Anxiety   . Chlamydia   . High blood pressure   . History of dysphagia   . History of severe postpartum preeclampsia in prior pregnancy, currently pregnant 09/01/2014  . Hx of varicella   . Left ovarian cyst   . Trichomonas vaginitis 2017  . Urinary tract infection     Past Surgical History:  Procedure Laterality Date  . LAPAROSCOPY N/A 12/30/2015   Procedure: LAPAROSCOPY DIAGNOSTIC;  Surgeon: Sherian Rein, MD;  Location: [facility];  Service: Gynecology;  Laterality: N/A;    Family History  Problem Relation Age of Onset  . Hypertension Father   . Hypertension Maternal Grandmother   . Alcohol abuse Neg Hx   . Arthritis Neg Hx   . Birth defects Neg Hx   . Asthma Neg Hx   . Cancer Neg Hx   . COPD Neg Hx   . Depression Neg Hx   . Diabetes Neg Hx   . Drug abuse Neg Hx   . Early death Neg Hx   . Hearing loss Neg Hx   . Heart disease Neg Hx   . Hyperlipidemia Neg Hx   . Kidney disease Neg Hx   . Learning disabilities Neg Hx   . Mental illness Neg Hx   . Mental retardation Neg Hx   . Miscarriages / Stillbirths Neg Hx   . Vision  loss Neg Hx   . Stroke Neg Hx     Social History Social History   Tobacco Use  . Smoking status: Never Smoker  . Smokeless tobacco: Never Used  Substance Use Topics  . Alcohol use: No  . Drug use: No    Allergies  Allergen Reactions  . No Known Allergies     Current Outpatient Medications  Medication Sig Dispense Refill  . Multiple Vitamin (MULTIVITAMIN WITH MINERALS) TABS tablet Take 1 tablet by mouth daily.    . cefixime (SUPRAX) 400 MG CAPS capsule Take 1 capsule (400 mg total) by mouth daily. 10 capsule 0  . ferrous sulfate (FERROUSUL) 325 (65 FE) MG tablet Take 1 tablet (325 mg total) by mouth 2 (two) times daily. (Patient not taking: Reported on 10/03/2017) 60 tablet 1  . metroNIDAZOLE (METROGEL VAGINAL) 0.75 % vaginal gel Place 1 Applicatorful vaginally 2 (two) times daily. (Patient not taking: Reported on 11/13/2017) 70 g 0  . Norethindrone-Ethinyl Estradiol-Fe Biphas (LO LOESTRIN FE) 1 MG-10 MCG / 10 MCG tablet Take 1 tablet by mouth daily. Take 1 tablet by mouth daily. 3 Package 4  .  phenazopyridine (PYRIDIUM) 200 MG tablet Take 1 tablet (200 mg total) by mouth 3 (three) times daily as needed for pain. 30 tablet 0  . terconazole (TERAZOL 3) 0.8 % vaginal cream Place 1 applicator vaginally at bedtime. (Patient not taking: Reported on 11/13/2017) 20 g 0  . tinidazole (TINDAMAX) 500 MG tablet Take 4 tablets (2,000 mg total) by mouth daily with breakfast. (Patient not taking: Reported on 11/13/2017) 12 tablet 0   No current facility-administered medications for this visit.     Review of Systems Review of Systems Constitutional: negative for fatigue and weight loss Respiratory: negative for cough and wheezing Cardiovascular: negative for chest pain, fatigue and palpitations Gastrointestinal: negative for abdominal pain and change in bowel habits Genitourinary:+UTI Integument/breast: negative for nipple discharge Musculoskeletal:negative for myalgias Neurological: negative  for gait problems and tremors Behavioral/Psych: negative for abusive relationship, depression Endocrine: negative for temperature intolerance      Blood pressure 107/65, pulse 92, height  (1.702 m), weight 134 lb 8 oz (61 kg), last menstrual period 11/02/2017, not currently breastfeeding.  Physical Exam Physical Exam General:   alert  Skin:   no rash or abnormalities  Lungs:   clear to auscultation bilaterally  Heart:   regular rate and rhythm, S1, S2 normal, no murmur, click, rub or gallop  Breasts:   deferred  Kidney:  Negative CVA tenderness  Abdomen:  normal findings: no organomegaly, soft, non-tender and no hernia  Pelvis:  External genitalia: normal general appearance Urinary system: urethral meatus normal and bladder without fullness, nontender Vaginal: normal without tenderness, induration or masses Cervix: no CMT Adnexa: normal bimanual exam Uterus: anteverted and non-tender, normal size    50% of 20 min visit spent on counseling and coordination of care.   Data Reviewed Previous medical hx, meds, labs  Assessment     1. Acute cystitis without hematuria    - POCT Urinalysis Dipstick - Urine Culture - cefixime (SUPRAX) 400 MG CAPS capsule; Take 1 capsule (400 mg total) by mouth daily.  Dispense: 10 capsule; Refill: 0 - phenazopyridine (PYRIDIUM) 200 MG tablet; Take 1 tablet (200 mg total) by mouth 3 (three) times daily as needed for pain.  Dispense: 30 tablet; Refill: 0  2. Acute vaginitis    - Cervicovaginal ancillary only  3. History of anemia    - CBC  4. Exposure to STD    - Hepatitis C antibody - RPR - Hepatitis B surface antigen - HIV antibody  5. Encounter for initial prescription of contraceptive pills   - Norethindrone-Ethinyl Estradiol-Fe Biphas (LO LOESTRIN FE) 1 MG-10 MCG / 10 MCG tablet; Take 1 tablet by mouth daily. Take 1 tablet by mouth daily.  Dispense: 3 Package; Refill: 4     Plan    Orders Placed This Encounter  Procedures   . Urine Culture  . Hepatitis C antibody  . RPR  . Hepatitis B surface antigen  . HIV antibody  . CBC  . POCT Urinalysis Dipstick   Meds ordered this encounter  Medications  . cefixime (SUPRAX) 400 MG CAPS capsule    Sig: Take 1 capsule (400 mg total) by mouth daily.    Dispense:  10 capsule    Refill:  0  . phenazopyridine (PYRIDIUM) 200 MG tablet    Sig: Take 1 tablet (200 mg total) by mouth 3 (three) times daily as needed for pain.    Dispense:  30 tablet    Refill:  0  . Norethindrone-Ethinyl Estradiol-Fe Biphas (LO LOESTRIN  FE) 1 MG-10 MCG / 10 MCG tablet    Sig: Take 1 tablet by mouth daily. Take 1 tablet by mouth daily.    Dispense:  3 Package    Refill:  4    BIN # [id number], PCN# [id number], [id number], ID#: [id number]    Need to obtain previous records Possible management options include:other OCPs Follow up in 3 months for contraception management.

## 2017-11-14 LAB — HIV ANTIBODY (ROUTINE TESTING W REFLEX): HIV SCREEN 4TH GENERATION: NONREACTIVE

## 2017-11-14 LAB — HEPATITIS C ANTIBODY: Hep C Virus Ab: <0.1 s/co ratio (ref 0.0–0.9)

## 2017-11-14 LAB — CBC
HEMATOCRIT: 36 % (ref 34.0–46.6)
HEMOGLOBIN: 11.6 g/dL (ref 11.1–15.9)
MCH: 27.9 pg (ref 26.6–33.0)
MCHC: 32.2 g/dL (ref 31.5–35.7)
MCV: 87 fL (ref 79–97)
Platelets: 279 10*3/uL (ref 150–450)
RBC: 4.16 x10E6/uL (ref 3.77–5.28)
RDW: 15.7 % — ABNORMAL HIGH (ref 12.3–15.4)
WBC: 6.5 10*3/uL (ref 3.4–10.8)

## 2017-11-14 LAB — CERVICOVAGINAL ANCILLARY ONLY
BACTERIAL VAGINITIS: POSITIVE — AB
Candida vaginitis: NEGATIVE
Chlamydia: NEGATIVE
Neisseria Gonorrhea: NEGATIVE
Trichomonas: NEGATIVE

## 2017-11-14 LAB — RPR: RPR: NONREACTIVE

## 2017-11-14 LAB — HEPATITIS B SURFACE ANTIGEN: HEP B S AG: NEGATIVE

## 2017-11-15 LAB — URINE CULTURE

## 2017-11-22 ENCOUNTER — Other Ambulatory Visit: Payer: Self-pay | Admitting: Certified Nurse Midwife

## 2017-11-22 DIAGNOSIS — N760 Acute vaginitis: Principal | ICD-10-CM

## 2017-11-22 DIAGNOSIS — B9689 Other specified bacterial agents as the cause of diseases classified elsewhere: Secondary | ICD-10-CM

## 2017-11-22 MED ORDER — METRONIDAZOLE 0.75 % VA GEL: 1.0000 | VAGINAL | 4 refills | Status: DC

## 2017-11-22 MED ORDER — SECNIDAZOLE 2 G PO PACK: 1.0000 | PACK | Freq: Once | ORAL | 0 refills | Status: AC

## 2018-03-16 IMAGING — US US OB TRANSVAGINAL
1 series · 15 of 28 positions shown · non-contrast
Comparison: None.

CLINICAL DATA: Vaginal bleeding and first trimester.

EXAM:
OBSTETRIC <14 WK US AND TRANSVAGINAL OB US
TECHNIQUE: Both transabdominal and transvaginal ultrasound examinations were
performed for complete evaluation of the gestation as well as the
maternal uterus, adnexal regions, and pelvic cul-de-sac.
Transvaginal technique was performed to assess early pregnancy.

[Series 1: us ob transvaginal · 15 of 78 slices shown]
[im 1/78]
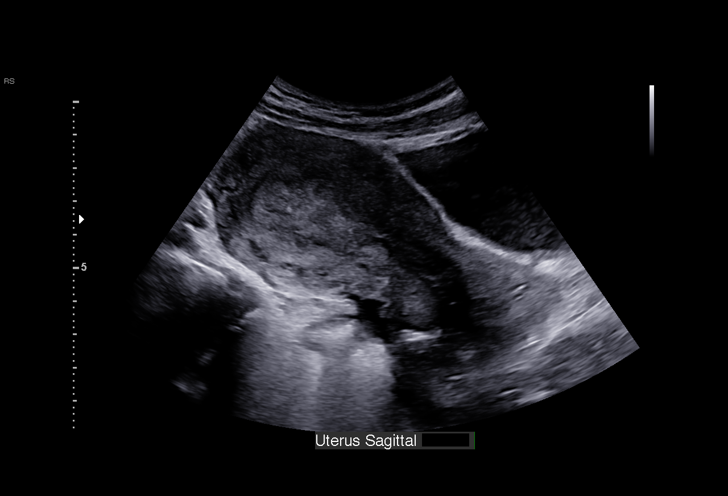
[im 6/78]
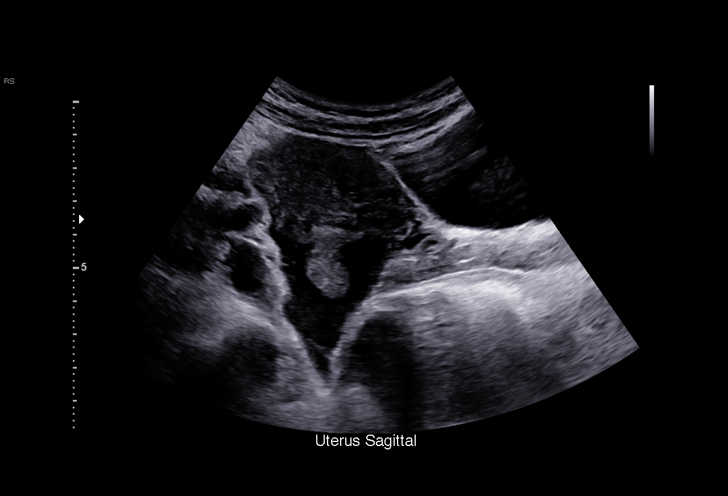
[im 12/78]
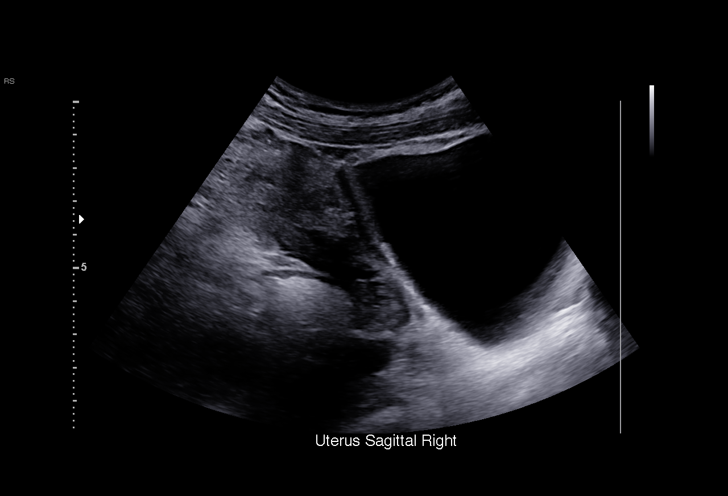
[im 18/78]
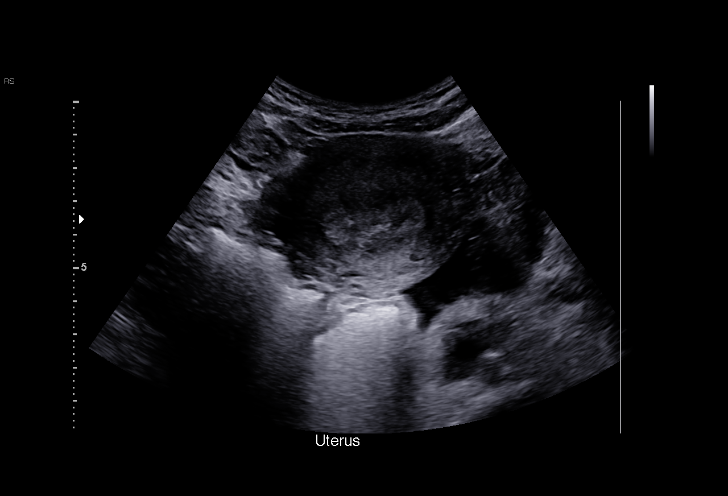
[im 23/78]
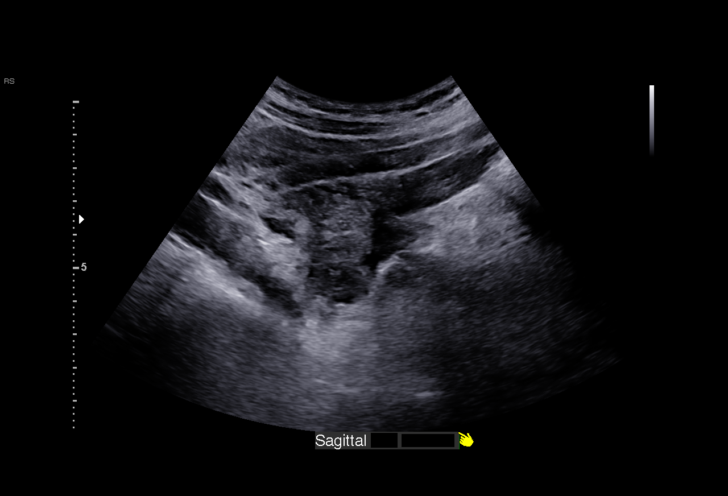
[im 29/78]
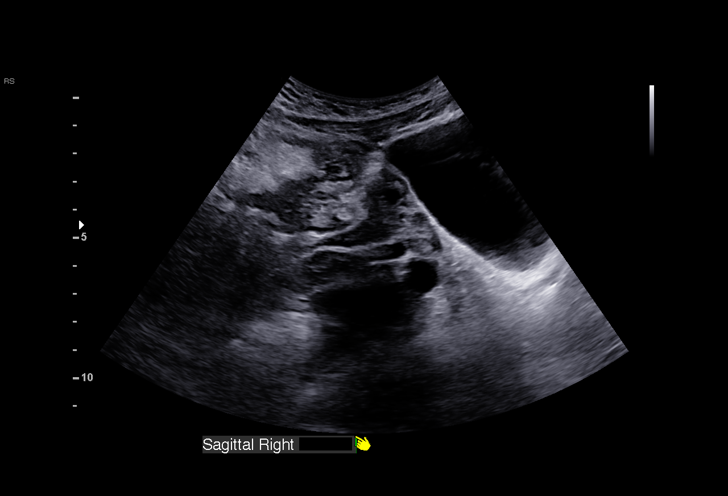
[im 35/78]
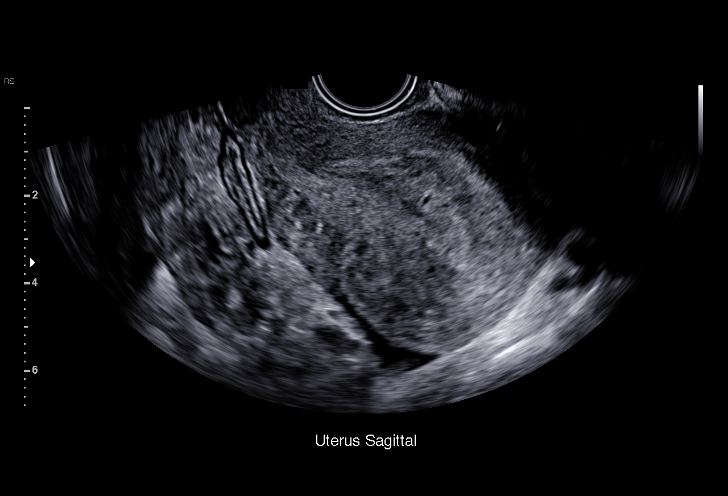
[im 40/78]
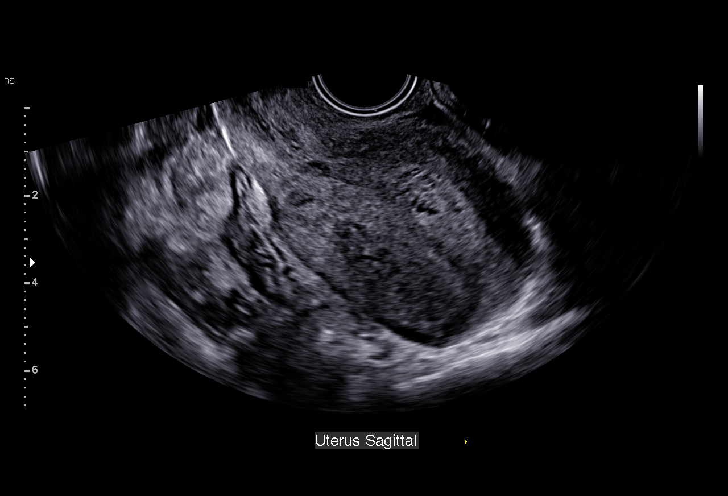
[im 43/78]
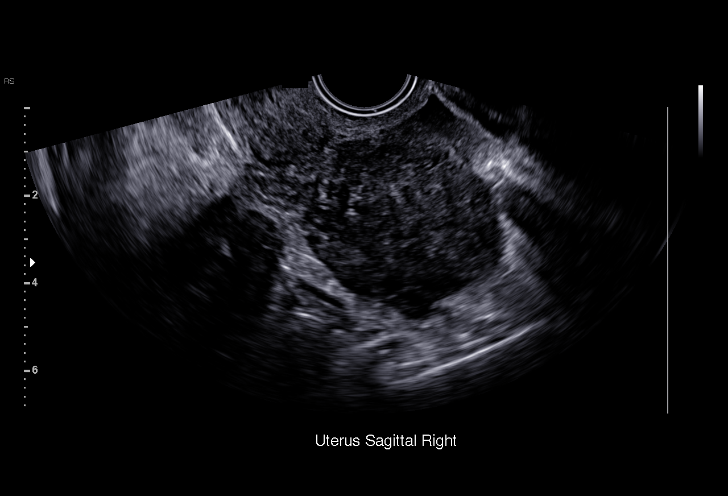
[im 49/78]
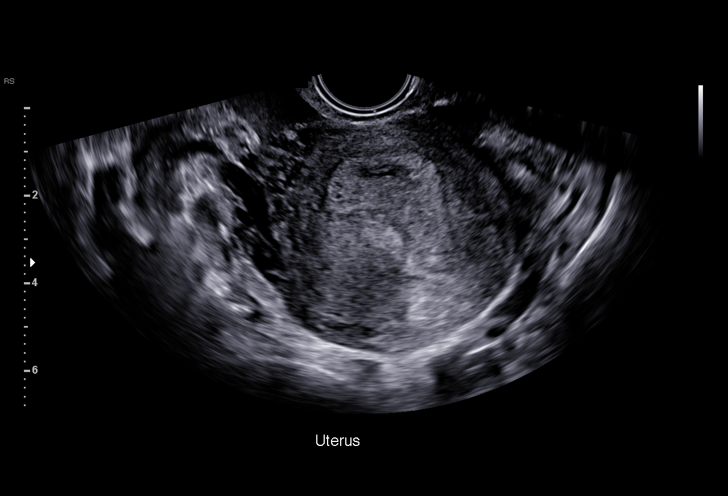
[im 55/78]
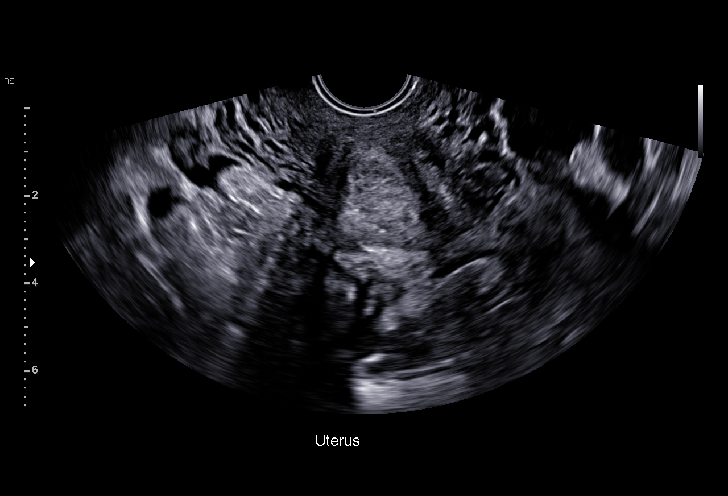
[im 60/78]
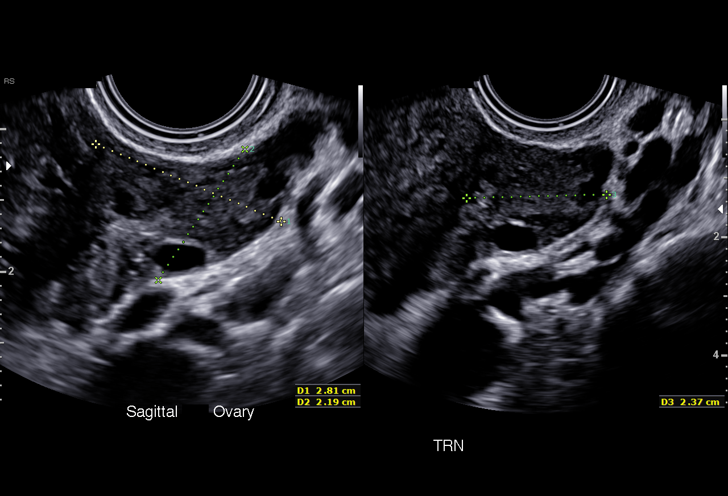
[im 66/78]
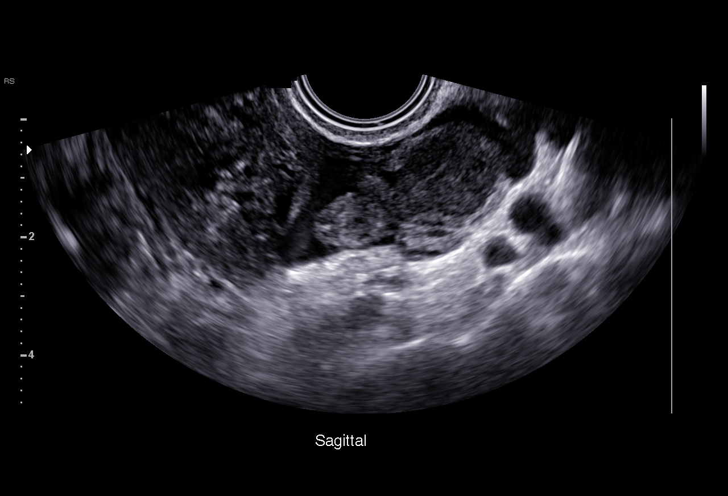
[im 72/78]
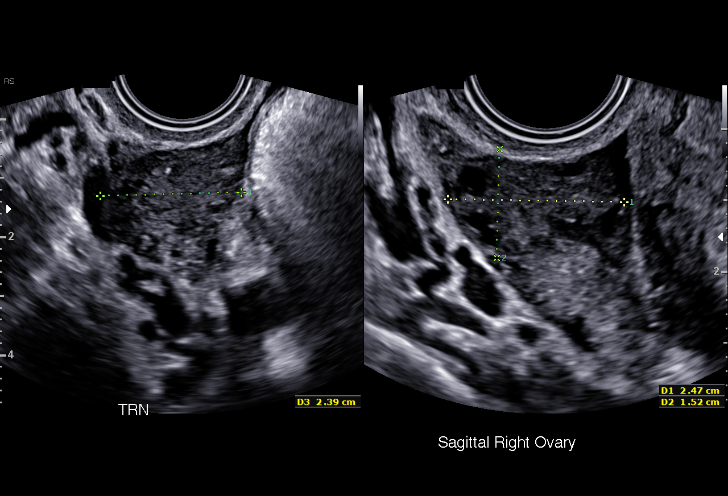
[im 78/78]
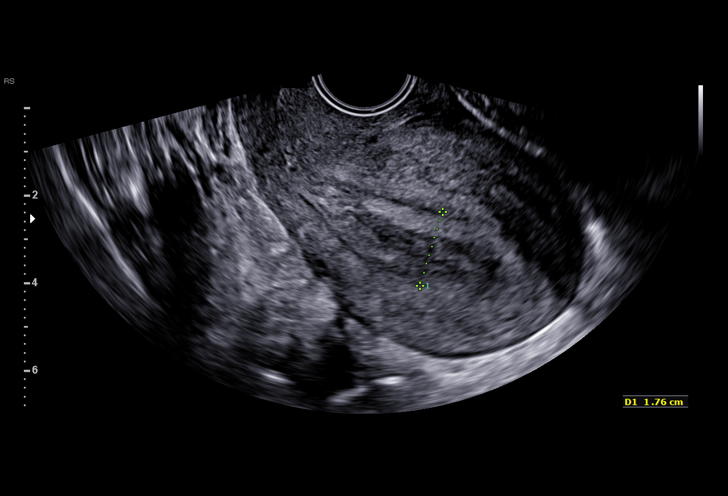

[15 of 28 positions shown; findings below may reference images not displayed]

FINDINGS: Intrauterine gestational sac: None

Yolk sac:  Not Visualized.

Embryo:  Not Visualized.

Cardiac Activity: Not Visualized.

Maternal uterus/adnexae:

Right ovary: Normal

Left ovary: Normal

Other :Bilateral hydrosalpinx identified. There is also complex
echogenic material identified within the endometrial cavity.

Free fluid: Complex fluid is identified within both adnexal regions.
IMPRESSION: 1. No intrauterine gestational sac, yolk sac, or fetal pole
identified. Differential considerations include intrauterine
pregnancy too early to be sonographically visualized, missed
abortion, or ectopic pregnancy. Followup ultrasound is recommended
in 10-14 days for further evaluation.
2. Bilateral hydrosalpinx and complex free fluid identified within
the pelvis and endometrial cavity. The appearance is nonspecific.
Cannot rule out pelvic inflammatory disease. Careful clinical
correlation is advised.

## 2018-04-09 ENCOUNTER — Ambulatory Visit: Payer: Medicaid Other | Admitting: Obstetrics & Gynecology

## 2018-04-24 ENCOUNTER — Ambulatory Visit: Payer: Medicaid Other | Admitting: Advanced Practice Midwife

## 2018-07-04 ENCOUNTER — Ambulatory Visit: Payer: Medicaid Other | Admitting: Obstetrics & Gynecology

## 2018-07-09 ENCOUNTER — Encounter: Payer: Self-pay | Admitting: Obstetrics and Gynecology

## 2018-07-09 ENCOUNTER — Ambulatory Visit: Payer: Medicaid Other | Admitting: Obstetrics and Gynecology

## 2018-07-10 NOTE — Progress Notes (Signed)
Patient did not keep her GYN appointment for 07/09/2018.  Kaelen Caughlin, Jr MD [facility]ending [facility] ([facility]ulty [facility])   

## 2018-09-02 ENCOUNTER — Ambulatory Visit: Payer: Medicaid Other | Admitting: Advanced Practice Midwife

## 2018-11-07 ENCOUNTER — Other Ambulatory Visit: Payer: Self-pay | Admitting: *Deleted

## 2018-11-07 DIAGNOSIS — B379 Candidiasis, unspecified: Secondary | ICD-10-CM

## 2018-11-07 DIAGNOSIS — B9689 Other specified bacterial agents as the cause of diseases classified elsewhere: Secondary | ICD-10-CM

## 2018-11-07 MED ORDER — METRONIDAZOLE 0.75 % VA GEL: 1.0000 | Freq: Two times a day (BID) | VAGINAL | 0 refills | Status: DC

## 2018-11-07 MED ORDER — FLUCONAZOLE 150 MG PO TABS: 150.0000 mg | ORAL_TABLET | Freq: Once | ORAL | 0 refills | Status: AC

## 2018-11-07 NOTE — Progress Notes (Signed)
Pt called to office with symptoms of BV and yeast, vagianl d/c with odor and itching. Metrogel and Diflucan sent per protocol.

## 2019-03-02 ENCOUNTER — Telehealth: Payer: Self-pay | Admitting: Obstetrics

## 2019-03-02 MED ORDER — FLUCONAZOLE 150 MG PO TABS: 150.0000 mg | ORAL_TABLET | Freq: Once | ORAL | 0 refills | Status: DC

## 2019-03-02 NOTE — Telephone Encounter (Signed)
Established patient whom is due for her annual called requesting a diflucan for her yeast infection.    Rx routed to pharmacy per protocol.  Annual scheduled.

## 2019-03-23 ENCOUNTER — Ambulatory Visit: Payer: Self-pay | Admitting: Advanced Practice Midwife

## 2019-04-03 ENCOUNTER — Ambulatory Visit: Payer: Medicaid Other | Admitting: Obstetrics

## 2019-04-03 ENCOUNTER — Encounter: Payer: Self-pay | Admitting: Obstetrics

## 2019-04-03 ENCOUNTER — Other Ambulatory Visit (HOSPITAL_COMMUNITY)
Admission: RE | Admit: 2019-04-03 | Discharge: 2019-04-03 | Disposition: A | Discharge status: Home / Self Care | Payer: Medicaid Other | Source: Ambulatory Visit | Attending: Obstetrics | Admitting: Obstetrics

## 2019-04-03 ENCOUNTER — Other Ambulatory Visit: Payer: Self-pay

## 2019-04-03 VITALS — BP 128/81 | HR 88 | Ht 67.0 in | Wt 157.0 lb

## 2019-04-03 DIAGNOSIS — Z01419 Encounter for gynecological examination (general) (routine) without abnormal findings: Secondary | ICD-10-CM

## 2019-04-03 DIAGNOSIS — N898 Other specified noninflammatory disorders of vagina: Secondary | ICD-10-CM

## 2019-04-03 DIAGNOSIS — Z0000 Encounter for general adult medical examination without abnormal findings: Secondary | ICD-10-CM

## 2019-04-03 DIAGNOSIS — N914 Secondary oligomenorrhea: Secondary | ICD-10-CM

## 2019-04-03 DIAGNOSIS — Z3202 Encounter for pregnancy test, result negative: Secondary | ICD-10-CM | POA: Diagnosis not present

## 2019-04-03 DIAGNOSIS — Z9889 Other specified postprocedural states: Secondary | ICD-10-CM

## 2019-04-03 DIAGNOSIS — Z3041 Encounter for surveillance of contraceptive pills: Secondary | ICD-10-CM

## 2019-04-03 LAB — POCT URINE PREGNANCY: Preg Test, Ur: NEGATIVE

## 2019-04-03 MED ORDER — LO LOESTRIN FE 1 MG-10 MCG / 10 MCG PO TABS: 1.0000 | ORAL_TABLET | Freq: Every day | ORAL | 4 refills | Status: DC

## 2019-04-03 NOTE — Progress Notes (Signed)
Subjective:        Amy Mclean is a 28 y.o. female here for a routine exam.  Current complaints: No period in September and October 2020.  Had a TAB in July 2020, and had a normal period in August, but no period since then.  Negative subjective signs of pregnancy.  She has had un protective intercourse since abortion.  Contraception: None.  Stopped taking Lo Loestrin several months ago.  Would like to restart OCP's.  Personal health questionnaire:  Is patient Ashkenazi Jewish, have a family history of breast and/or ovarian cancer: no Is there a family history of uterine cancer diagnosed at age < 44, gastrointestinal cancer, urinary tract cancer, family member who is a Personnel officer syndrome-associated carrier: no Is the patient overweight and hypertensive, family history of diabetes, personal history of gestational diabetes, preeclampsia or PCOS: no Is patient over 6, have PCOS,  family history of premature CHD under age 79, diabetes, smoke, have hypertension or peripheral artery disease:  no At any time, has a partner hit, kicked or otherwise hurt or frightened you?: no Over the past 2 weeks, have you felt down, depressed or hopeless?: no Over the past 2 weeks, have you felt little interest or pleasure in doing things?:no   Gynecologic History Patient's last menstrual period was 02/10/2019. Contraception: none Last Pap: unknown. Results were: normal Last mammogram: n/a. Results were: n/a  Obstetric History OB History  Gravida Para Term Preterm AB Living  8 4 4  0 4 4  SAB TAB Ectopic Multiple Live Births  2 2 0 0 4    # Outcome Date GA Lbr Len/2nd Weight Sex Delivery Anes PTL Lv  8 Term 05/17/17 [redacted]w[redacted]d 05:10 / 00:13 6 lb 13.2 oz (3.096 kg) M Vag-Spont EPI  LIV     Birth Comments: WNL  7 SAB 12/2015          6 TAB 08/2015     TAB     5 Term 08/23/14 [redacted]w[redacted]d 05:15 / 00:34 8 lb (3.629 kg) F Vag-Spont EPI  LIV     Complications: Preeclampsia in postpartum period  4 Term 04/2004 [redacted]w[redacted]d     Vag-Spont   LIV  3 TAB           2 SAB           1 Term      Vag-Spont   LIV    Past Medical History:  Diagnosis Date  . Adnexal mass 12/30/2015  . Anemia   . Anxiety   . Chlamydia   . High blood pressure   . History of dysphagia   . History of severe postpartum preeclampsia in prior pregnancy, currently pregnant 09/01/2014  . Hx of varicella   . Left ovarian cyst   . Trichomonas vaginitis 2017  . Urinary tract infection     Past Surgical History:  Procedure Laterality Date  . LAPAROSCOPY N/A 12/30/2015   Procedure: LAPAROSCOPY DIAGNOSTIC;  Surgeon: 01/01/2016, MD;  Location: [facility];  Service: Gynecology;  Laterality: N/A;     Current Outpatient Medications:  .  cefixime (SUPRAX) 400 MG CAPS capsule, Take 1 capsule (400 mg total) by mouth daily. (Patient not taking: Reported on 04/03/2019), Disp: 10 capsule, Rfl: 0 .  ferrous sulfate (FERROUSUL) 325 (65 FE) MG tablet, Take 1 tablet (325 mg total) by mouth 2 (two) times daily. (Patient not taking: Reported on 10/03/2017), Disp: 60 tablet, Rfl: 1 .  metroNIDAZOLE (METROGEL VAGINAL) 0.75 % vaginal gel, Place  1 Applicatorful vaginally 2 (two) times daily. (Patient not taking: Reported on 04/03/2019), Disp: 70 g, Rfl: 0 .  Multiple Vitamin (MULTIVITAMIN WITH MINERALS) TABS tablet, Take 1 tablet by mouth daily., Disp: , Rfl:  .  Norethindrone-Ethinyl Estradiol-Fe Biphas (LO LOESTRIN FE) 1 MG-10 MCG / 10 MCG tablet, Take 1 tablet by mouth daily. Take 1 tablet by mouth daily., Disp: 3 Package, Rfl: 4 .  phenazopyridine (PYRIDIUM) 200 MG tablet, Take 1 tablet (200 mg total) by mouth 3 (three) times daily as needed for pain. (Patient not taking: Reported on 04/03/2019), Disp: 30 tablet, Rfl: 0 .  terconazole (TERAZOL 3) 0.8 % vaginal cream, Place 1 applicator vaginally at bedtime. (Patient not taking: Reported on 11/13/2017), Disp: 20 g, Rfl: 0 .  tinidazole (TINDAMAX) 500 MG tablet, Take 4 tablets (2,000 mg total) by mouth daily with  breakfast. (Patient not taking: Reported on 11/13/2017), Disp: 12 tablet, Rfl: 0 Allergies  Allergen Reactions  . No Known Allergies     Social History   Tobacco Use  . Smoking status: Never Smoker  . Smokeless tobacco: Never Used  Substance Use Topics  . Alcohol use: No    Family History  Problem Relation Age of Onset  . Hypertension Father   . Hypertension Maternal Grandmother   . Alcohol abuse Neg Hx   . Arthritis Neg Hx   . Birth defects Neg Hx   . Asthma Neg Hx   . Cancer Neg Hx   . COPD Neg Hx   . Depression Neg Hx   . Diabetes Neg Hx   . Drug abuse Neg Hx   . Early death Neg Hx   . Hearing loss Neg Hx   . Heart disease Neg Hx   . Hyperlipidemia Neg Hx   . Kidney disease Neg Hx   . Learning disabilities Neg Hx   . Mental illness Neg Hx   . Mental retardation Neg Hx   . Miscarriages / Stillbirths Neg Hx   . Vision loss Neg Hx   . Stroke Neg Hx       Review of Systems  Constitutional: negative for fatigue and weight loss Respiratory: negative for cough and wheezing Cardiovascular: negative for chest pain, fatigue and palpitations Gastrointestinal: negative for abdominal pain and change in bowel habits Musculoskeletal:negative for myalgias Neurological: negative for gait problems and tremors Behavioral/Psych: negative for abusive relationship, depression Endocrine: negative for temperature intolerance    Genitourinary: positive for no period for 2 months, and negative for genital lesions, hot flashes, sexual problems and vaginal discharge Integument/breast: negative for breast lump, breast tenderness, nipple discharge and skin lesion(s)    Objective:       BP 128/81   Pulse 88   Ht 5\' 7"  (1.702 m)   Wt 157 lb (71.2 kg)   LMP 02/10/2019   BMI 24.59 kg/m  General:   alert  Skin:   no rash or abnormalities  Lungs:   clear to auscultation bilaterally  Heart:   regular rate and rhythm, S1, S2 normal, no murmur, click, rub or gallop  Breasts:   normal  without suspicious masses, skin or nipple changes or axillary nodes  Abdomen:  normal findings: no organomegaly, soft, non-tender and no hernia  Pelvis:  External genitalia: normal general appearance Urinary system: urethral meatus normal and bladder without fullness, nontender Vaginal: normal without tenderness, induration or masses Cervix: normal appearance Adnexa: normal bimanual exam Uterus: anteverted and non-tender, normal size   Lab Review Urine pregnancy test  Labs reviewed yes Radiologic studies reviewed no  50% of 25 min visit spent on counseling and coordination of care.   Assessment:     1. Encounter for routine gynecological examination with Papanicolaou smear of cervix Rx: - Cytology - PAP( [place])  2. Status post therapeutic abortion  3. Secondary oligomenorrhea Rx: - POCT urine pregnancy - Negative  4. Vaginal discharge Rx: - Cervicovaginal ancillary only( [place])  5. Encounter for surveillance of contraceptive pills Rx: - Norethindrone-Ethinyl Estradiol-Fe Biphas (LO LOESTRIN FE) 1 MG-10 MCG / 10 MCG tablet; Take 1 tablet by mouth daily. Take 1 tablet by mouth daily.  Dispense: 3 Package; Refill: 4    Plan:    Education reviewed: calcium supplements, depression evaluation, low fat, low cholesterol diet, safe sex/STD prevention, self breast exams and weight bearing exercise. Contraception: OCP (estrogen/progesterone). Follow up in: 1 year.   Meds ordered this encounter  Medications  . Norethindrone-Ethinyl Estradiol-Fe Biphas (LO LOESTRIN FE) 1 MG-10 MCG / 10 MCG tablet    Sig: Take 1 tablet by mouth daily. Take 1 tablet by mouth daily.    Dispense:  3 Package    Refill:  4    BIN # [id number], PCN# [id number], [id number]#[id number], ID#: [id number]: [id number]   Orders Placed This Encounter  Procedures  . POCT urine pregnancy    Brock BadHarper, Pleasant Britz A, MD 04/03/2019 11:27 AM

## 2019-04-03 NOTE — Progress Notes (Signed)
Pt is in the office for annual. Pt desires BC, but is unsure of the method. Pt requests std testing, has vaginal discharge. LMP 02-10-19, pt states she had a TAB in July, UPT negative today. GAD 7= 4

## 2019-04-08 ENCOUNTER — Other Ambulatory Visit: Payer: Self-pay | Admitting: Obstetrics

## 2019-04-08 DIAGNOSIS — B9689 Other specified bacterial agents as the cause of diseases classified elsewhere: Secondary | ICD-10-CM

## 2019-04-08 DIAGNOSIS — N760 Acute vaginitis: Secondary | ICD-10-CM

## 2019-04-08 LAB — CERVICOVAGINAL ANCILLARY ONLY
Bacterial Vaginitis (gardnerella): POSITIVE — AB
Candida Glabrata: NEGATIVE
Candida Vaginitis: NEGATIVE
Chlamydia: NEGATIVE
Comment: NEGATIVE
Comment: NEGATIVE
Comment: NEGATIVE
Comment: NEGATIVE
Comment: NEGATIVE
Comment: NORMAL
Neisseria Gonorrhea: NEGATIVE
Trichomonas: NEGATIVE

## 2019-04-08 MED ORDER — METRONIDAZOLE 0.75 % VA GEL: 1.0000 | Freq: Two times a day (BID) | VAGINAL | 2 refills | Status: DC

## 2019-04-09 LAB — CYTOLOGY - PAP
Adequacy: ABSENT
Comment: NEGATIVE
Diagnosis: NEGATIVE
High risk HPV: NEGATIVE

## 2019-04-15 ENCOUNTER — Other Ambulatory Visit: Payer: Self-pay | Admitting: Obstetrics

## 2019-04-27 ENCOUNTER — Other Ambulatory Visit: Payer: Self-pay | Admitting: Obstetrics

## 2019-04-28 ENCOUNTER — Other Ambulatory Visit: Payer: Self-pay | Admitting: Obstetrics

## 2019-06-24 ENCOUNTER — Telehealth: Payer: Self-pay

## 2019-06-24 ENCOUNTER — Other Ambulatory Visit: Payer: Self-pay

## 2019-06-24 DIAGNOSIS — N898 Other specified noninflammatory disorders of vagina: Secondary | ICD-10-CM

## 2019-06-24 MED ORDER — FLUCONAZOLE 150 MG PO TABS: 150.0000 mg | ORAL_TABLET | Freq: Once | ORAL | 0 refills | Status: AC

## 2019-06-24 NOTE — Progress Notes (Signed)
Rx sent for yeast per protocol.   Pt made aware to make appt if no relief.

## 2019-06-24 NOTE — Telephone Encounter (Signed)
Diflucan sent per protocol pt advised if no Relief after 7-14 days Make appt for vaginal swab w/ Nurse or provider Pt agreeable.

## 2019-07-01 ENCOUNTER — Other Ambulatory Visit (HOSPITAL_COMMUNITY)
Admission: RE | Admit: 2019-07-01 | Discharge: 2019-07-01 | Disposition: A | Discharge status: Home / Self Care | Payer: Medicaid Other | Source: Ambulatory Visit | Attending: Obstetrics | Admitting: Obstetrics

## 2019-07-01 ENCOUNTER — Ambulatory Visit (INDEPENDENT_AMBULATORY_CARE_PROVIDER_SITE_OTHER): Payer: Medicaid Other

## 2019-07-01 ENCOUNTER — Other Ambulatory Visit: Payer: Self-pay

## 2019-07-01 VITALS — BP 134/85 | HR 102 | Temp 97.7°F | Ht 67.0 in | Wt 155.8 lb

## 2019-07-01 DIAGNOSIS — N3001 Acute cystitis with hematuria: Secondary | ICD-10-CM | POA: Diagnosis not present

## 2019-07-01 DIAGNOSIS — N898 Other specified noninflammatory disorders of vagina: Secondary | ICD-10-CM

## 2019-07-01 DIAGNOSIS — R300 Dysuria: Secondary | ICD-10-CM

## 2019-07-01 LAB — POCT URINALYSIS DIPSTICK
Bilirubin, UA: NEGATIVE
Glucose, UA: NEGATIVE
Nitrite, UA: NEGATIVE
Protein, UA: NEGATIVE
Spec Grav, UA: 1.025 (ref 1.010–1.025)
Urobilinogen, UA: NEGATIVE E.U./dL — AB
pH, UA: 5 (ref 5.0–8.0)

## 2019-07-01 MED ORDER — NITROFURANTOIN MONOHYD MACRO 100 MG PO CAPS: 100.0000 mg | ORAL_CAPSULE | Freq: Two times a day (BID) | ORAL | 2 refills | Status: DC

## 2019-07-01 NOTE — Progress Notes (Signed)
I reviewed the nurses note and agree with the plan of care.   Lincy Belles A, MD 09/13/2017 10:46 AM  

## 2019-07-01 NOTE — Progress Notes (Signed)
SUBJECTIVE: Amy Mclean is a 29 y.o. female who complains of urinary frequency, urgency, odor, burning and dysuria x 7 days, without flank pain, fever, chills, or abnormal vaginal discharge or bleeding.   OBJECTIVE: Appears well, in no apparent distress.  Vital signs are normal. Urine dipstick shows positive for leukocytes, ketones.    ASSESSMENT: Dysuria  PLAN: Treatment per orders, urine sent for culture.  Call or return to clinic prn if these symptoms worsen or fail to improve as anticipated.   ___________________________________  SUBJECTIVE:  29 y.o. female complains of malodorous, yellowish, vaginal discharge for 2 week(s). Denies abnormal vaginal bleeding or significant pelvic pain or fever. No UTI symptoms. Denies history of known exposure to STD.  Patient's last menstrual period was 06/11/2019 (exact date).  OBJECTIVE:  She appears well, afebrile. Urine dipstick: positive for leukocytes.  ASSESSMENT:  Vaginal Discharge  Vaginal Odor   PLAN:  GC, chlamydia, trichomonas, BVAG, CVAG probe sent to lab. Treatment: To be determined once lab results are received ROV prn if symptoms persist or worsen.

## 2019-07-02 ENCOUNTER — Other Ambulatory Visit: Payer: Self-pay | Admitting: Obstetrics

## 2019-07-02 DIAGNOSIS — N760 Acute vaginitis: Secondary | ICD-10-CM

## 2019-07-02 DIAGNOSIS — B9689 Other specified bacterial agents as the cause of diseases classified elsewhere: Secondary | ICD-10-CM

## 2019-07-02 LAB — CERVICOVAGINAL ANCILLARY ONLY
Bacterial Vaginitis (gardnerella): POSITIVE — AB
Candida Glabrata: NEGATIVE
Candida Vaginitis: NEGATIVE
Chlamydia: NEGATIVE
Comment: NEGATIVE
Comment: NEGATIVE
Comment: NEGATIVE
Comment: NEGATIVE
Comment: NEGATIVE
Comment: NORMAL
Neisseria Gonorrhea: NEGATIVE
Trichomonas: NEGATIVE

## 2019-07-02 MED ORDER — SOLOSEC 2 G PO PACK: 1.0000 | PACK | Freq: Once | ORAL | 2 refills | Status: AC

## 2019-07-03 LAB — URINE CULTURE

## 2019-07-04 ENCOUNTER — Other Ambulatory Visit: Payer: Self-pay | Admitting: Obstetrics

## 2019-08-10 ENCOUNTER — Other Ambulatory Visit: Payer: Self-pay

## 2019-08-10 DIAGNOSIS — B9689 Other specified bacterial agents as the cause of diseases classified elsewhere: Secondary | ICD-10-CM

## 2019-08-10 MED ORDER — METRONIDAZOLE 500 MG PO TABS: 500.0000 mg | ORAL_TABLET | Freq: Two times a day (BID) | ORAL | 0 refills | Status: DC

## 2019-08-10 NOTE — Progress Notes (Signed)
Rx flagyl sent per protocol for BV symptoms. Pt reports she was prescribed solosec last month but vomited after she took it. Advised pt to call back in a week if symptoms have not improved and she will need to come in for evaluation.

## 2019-08-17 ENCOUNTER — Ambulatory Visit: Payer: Medicaid Other | Attending: Family Medicine | Admitting: Physical Therapy

## 2019-09-07 ENCOUNTER — Ambulatory Visit: Payer: Medicaid Other

## 2019-09-30 ENCOUNTER — Other Ambulatory Visit: Payer: Self-pay | Admitting: Obstetrics

## 2019-09-30 DIAGNOSIS — N898 Other specified noninflammatory disorders of vagina: Secondary | ICD-10-CM

## 2019-10-21 ENCOUNTER — Other Ambulatory Visit: Payer: Self-pay | Admitting: Obstetrics

## 2019-10-21 DIAGNOSIS — O99013 Anemia complicating pregnancy, third trimester: Secondary | ICD-10-CM

## 2019-10-21 MED ORDER — FERROUS SULFATE 325 (65 FE) MG PO TABS: 325.0000 mg | ORAL_TABLET | Freq: Two times a day (BID) | ORAL | 5 refills | Status: DC

## 2019-10-29 ENCOUNTER — Telehealth: Payer: Self-pay

## 2019-10-29 MED ORDER — FLUCONAZOLE 150 MG PO TABS: 150.0000 mg | ORAL_TABLET | Freq: Once | ORAL | 0 refills | Status: AC

## 2019-10-29 NOTE — Telephone Encounter (Signed)
Pt requests iron refill. Pt has 5 refills left at [facility].  Pt to call [facility] to request refill.  During call, pt requested yeast infection rx for vaginal irritation.  Diflucan sent per protocol  Discussed with pt about avoiding recurrent yeast infections

## 2020-01-23 ENCOUNTER — Other Ambulatory Visit: Payer: Self-pay | Admitting: Obstetrics

## 2020-01-23 DIAGNOSIS — N760 Acute vaginitis: Secondary | ICD-10-CM

## 2020-02-12 ENCOUNTER — Other Ambulatory Visit: Payer: Self-pay | Admitting: Obstetrics and Gynecology

## 2020-02-12 DIAGNOSIS — N898 Other specified noninflammatory disorders of vagina: Secondary | ICD-10-CM

## 2020-06-24 ENCOUNTER — Other Ambulatory Visit: Payer: Self-pay

## 2020-06-24 ENCOUNTER — Ambulatory Visit: Payer: Medicaid Other | Admitting: Nurse Practitioner

## 2020-10-07 ENCOUNTER — Emergency Department (HOSPITAL_COMMUNITY): Payer: Medicaid Other

## 2020-10-07 ENCOUNTER — Other Ambulatory Visit: Payer: Self-pay

## 2020-10-07 ENCOUNTER — Encounter (HOSPITAL_COMMUNITY): Payer: Self-pay | Admitting: *Deleted

## 2020-10-07 ENCOUNTER — Emergency Department (HOSPITAL_COMMUNITY)
Admission: EM | Admit: 2020-10-07 | Discharge: 2020-10-07 | Disposition: A | Discharge status: Home / Self Care | Payer: Medicaid Other | Attending: Emergency Medicine | Admitting: Emergency Medicine

## 2020-10-07 DIAGNOSIS — T1490XA Injury, unspecified, initial encounter: Secondary | ICD-10-CM

## 2020-10-07 DIAGNOSIS — W3400XA Accidental discharge from unspecified firearms or gun, initial encounter: Secondary | ICD-10-CM | POA: Insufficient documentation

## 2020-10-07 DIAGNOSIS — S71101A Unspecified open wound, right thigh, initial encounter: Secondary | ICD-10-CM | POA: Diagnosis present

## 2020-10-07 DIAGNOSIS — Z23 Encounter for immunization: Secondary | ICD-10-CM | POA: Insufficient documentation

## 2020-10-07 MED ORDER — TETANUS-DIPHTH-ACELL PERTUSSIS 5-2.5-18.5 LF-MCG/0.5 IM SUSY
0.5000 mL | PREFILLED_SYRINGE | Freq: Once | INTRAMUSCULAR | Status: AC
  Administered 2020-10-07: 0.5 mL via INTRAMUSCULAR

## 2020-10-07 MED ORDER — LORAZEPAM 2 MG/ML IJ SOLN
1.0000 mg | Freq: Once | INTRAMUSCULAR | Status: AC
  Administered 2020-10-07: 1 mg via INTRAVENOUS

## 2020-10-07 MED ORDER — HYDROCODONE-ACETAMINOPHEN 5-325 MG PO TABS
1.0000 | ORAL_TABLET | Freq: Once | ORAL | Status: AC
  Administered 2020-10-07: 1 via ORAL
  Filled 2020-10-07: qty 1
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
## 2020-10-07 NOTE — ED Notes (Signed)
bp 143/52  p 127
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
## 2020-10-07 NOTE — ED Notes (Signed)
Portable xrays 
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
## 2020-10-07 NOTE — ED Notes (Signed)
Wound care by the ed resident

## 2020-10-11 ENCOUNTER — Ambulatory Visit
Admission: EM | Admit: 2020-10-11 | Discharge: 2020-10-11 | Disposition: A | Discharge status: Home / Self Care | Payer: Medicaid Other | Attending: Emergency Medicine | Admitting: Emergency Medicine

## 2020-10-11 ENCOUNTER — Other Ambulatory Visit: Payer: Self-pay

## 2020-10-11 DIAGNOSIS — Z5189 Encounter for other specified aftercare: Secondary | ICD-10-CM

## 2020-10-11 DIAGNOSIS — W3400XA Accidental discharge from unspecified firearms or gun, initial encounter: Secondary | ICD-10-CM | POA: Diagnosis not present

## 2020-10-11 DIAGNOSIS — S79921A Unspecified injury of right thigh, initial encounter: Secondary | ICD-10-CM

## 2020-10-11 MED ORDER — CEPHALEXIN 500 MG PO CAPS: 500.0000 mg | ORAL_CAPSULE | Freq: Four times a day (QID) | ORAL | 0 refills | Status: AC

## 2020-10-11 MED ORDER — HYDROXYZINE HCL 25 MG PO TABS: 25.0000 mg | ORAL_TABLET | Freq: Four times a day (QID) | ORAL | 0 refills | Status: DC

## 2020-10-11 MED ORDER — IBUPROFEN 800 MG PO TABS: 800.0000 mg | ORAL_TABLET | Freq: Three times a day (TID) | ORAL | 0 refills | Status: DC
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
## 2020-10-11 NOTE — Discharge Instructions (Signed)
Begin Keflex 4 times daily for 5 days to help prevent infection Continue to keep wounds clean and dry Tylenol and ibuprofen for pain Continue to wear Ace wrap Ice and elevate leg Hydroxyzine as needed for any anxiety-may cause drowsiness, do not drive or work after taking Follow-up for any concerns

## 2020-11-18 ENCOUNTER — Other Ambulatory Visit: Payer: Self-pay | Admitting: Obstetrics

## 2020-11-18 DIAGNOSIS — N760 Acute vaginitis: Secondary | ICD-10-CM

## 2020-11-18 DIAGNOSIS — B9689 Other specified bacterial agents as the cause of diseases classified elsewhere: Secondary | ICD-10-CM

## 2021-02-03 ENCOUNTER — Other Ambulatory Visit: Payer: Self-pay

## 2021-02-03 ENCOUNTER — Other Ambulatory Visit (HOSPITAL_COMMUNITY)
Admission: RE | Admit: 2021-02-03 | Discharge: 2021-02-03 | Disposition: A | Discharge status: Home / Self Care | Payer: Medicaid Other | Source: Ambulatory Visit | Attending: Obstetrics and Gynecology | Admitting: Obstetrics and Gynecology

## 2021-02-03 ENCOUNTER — Ambulatory Visit: Payer: Medicaid Other

## 2021-02-03 VITALS — BP 126/87 | HR 101

## 2021-02-03 DIAGNOSIS — N898 Other specified noninflammatory disorders of vagina: Secondary | ICD-10-CM

## 2021-02-03 NOTE — Progress Notes (Signed)
SUBJECTIVE:  30 y.o. female complains of foul vaginal discharge for a couple of days. . Denies abnormal vaginal bleeding or significant pelvic pain or fever. No UTI symptoms. Denies history of known exposure to STD.  No LMP recorded. (Menstrual status: Other).  OBJECTIVE:  She appears well, afebrile. Urine dipstick: not done.  ASSESSMENT:  Vaginal Discharge: yes  Vaginal Odor: yes    PLAN:  GC, chlamydia, trichomonas, BVAG, CVAG probe sent to lab. Treatment: To be determined once lab results are received ROV prn if symptoms persist or worsen.

## 2021-02-06 LAB — CERVICOVAGINAL ANCILLARY ONLY
Bacterial Vaginitis (gardnerella): POSITIVE — AB
Candida Glabrata: NEGATIVE
Candida Vaginitis: POSITIVE — AB
Comment: NEGATIVE
Comment: NEGATIVE
Comment: NEGATIVE
Comment: NEGATIVE
Trichomonas: NEGATIVE

## 2021-02-07 ENCOUNTER — Other Ambulatory Visit: Payer: Self-pay

## 2021-02-07 DIAGNOSIS — N898 Other specified noninflammatory disorders of vagina: Secondary | ICD-10-CM

## 2021-02-07 MED ORDER — METRONIDAZOLE 500 MG PO TABS: 500.0000 mg | ORAL_TABLET | Freq: Two times a day (BID) | ORAL | 0 refills | Status: DC

## 2021-02-07 MED ORDER — FLUCONAZOLE 150 MG PO TABS: 150.0000 mg | ORAL_TABLET | Freq: Once | ORAL | 0 refills | Status: DC

## 2021-02-07 NOTE — Progress Notes (Signed)
Rx sent as advised by Dr.Ervin TC to pt to notify pt not ava busy signal

## 2021-02-09 ENCOUNTER — Other Ambulatory Visit: Payer: Self-pay

## 2021-02-09 DIAGNOSIS — B9689 Other specified bacterial agents as the cause of diseases classified elsewhere: Secondary | ICD-10-CM

## 2021-02-09 MED ORDER — METRONIDAZOLE 0.75 % VA GEL: 1.0000 | Freq: Two times a day (BID) | VAGINAL | 0 refills | Status: DC

## 2021-02-09 NOTE — Progress Notes (Signed)
S/w pt and she stated that she could not swallow Flagyl tablets due to the taste, sent metrogel to the pharmacy.

## 2021-03-11 ENCOUNTER — Other Ambulatory Visit: Payer: Self-pay | Admitting: Obstetrics and Gynecology

## 2021-03-11 DIAGNOSIS — N898 Other specified noninflammatory disorders of vagina: Secondary | ICD-10-CM

## 2021-03-15 ENCOUNTER — Telehealth: Payer: Self-pay | Admitting: *Deleted

## 2021-03-15 NOTE — Telephone Encounter (Signed)
Returned TC regarding RX for Diflucan. Patient reports signs and symptoms of vaginal yeast infection. RX already sent by Dr. Clearance Coots 03/13/21. Patient informed. Patient overdue for annual 06/2019, transferred to front office to schedule.

## 2021-04-03 ENCOUNTER — Other Ambulatory Visit: Payer: Self-pay

## 2021-04-03 DIAGNOSIS — B9689 Other specified bacterial agents as the cause of diseases classified elsewhere: Secondary | ICD-10-CM

## 2021-04-03 DIAGNOSIS — N760 Acute vaginitis: Secondary | ICD-10-CM

## 2021-04-03 MED ORDER — METRONIDAZOLE 0.75 % VA GEL: VAGINAL | 0 refills | Status: DC

## 2021-04-03 NOTE — Telephone Encounter (Signed)
Patient requested a refill to have metrogel called into her pharmacy but the pills were sent.Will send in the correct medications.

## 2021-04-18 ENCOUNTER — Ambulatory Visit (INDEPENDENT_AMBULATORY_CARE_PROVIDER_SITE_OTHER): Payer: Medicaid Other | Admitting: Obstetrics

## 2021-04-18 ENCOUNTER — Encounter: Payer: Self-pay | Admitting: Obstetrics

## 2021-04-18 ENCOUNTER — Other Ambulatory Visit: Payer: Self-pay

## 2021-04-18 VITALS — BP 126/85 | HR 114 | Wt 123.9 lb

## 2021-04-18 DIAGNOSIS — Z7251 High risk heterosexual behavior: Secondary | ICD-10-CM

## 2021-04-18 MED ORDER — LEVONORGESTREL 1.5 MG PO TABS: 1.5000 mg | ORAL_TABLET | Freq: Once | ORAL | 0 refills | Status: AC

## 2021-04-18 MED ORDER — LO LOESTRIN FE 1 MG-10 MCG / 10 MCG PO TABS: 1.0000 | ORAL_TABLET | Freq: Every day | ORAL | 4 refills | Status: DC

## 2021-04-18 NOTE — Progress Notes (Signed)
Subjective:    Amy Mclean is a 30 y.o. female who presents for contraception counseling. The patient has no complaints today. The patient is sexually active.  She admits to having unprotected intercourse 2 days ago, and is requesting Plan B.  She is also interested in starting OCP's.  Pertinent past medical history: hypertension, sexually transmitted diseases, and urinary tract infections.  The information documented in the HPI was reviewed and verified.  Menstrual History: OB History     Gravida  8   Para  4   Term  4   Preterm  0   AB  4   Living  4      SAB  2   IAB  2   Ectopic  0   Multiple      Live Births  4            No LMP recorded. (Menstrual status: Other).   Patient Active Problem List   Diagnosis Date Noted   Anemia 01/03/2017   Past Medical History:  Diagnosis Date   Adnexal mass 12/30/2015   Anemia    Anxiety    Chlamydia    High blood pressure    History of dysphagia    History of severe postpartum preeclampsia in prior pregnancy, currently pregnant 09/01/2014   Hx of varicella    Left ovarian cyst    Trichomonas vaginitis 2017   Urinary tract infection     Past Surgical History:  Procedure Laterality Date   LAPAROSCOPY N/A 12/30/2015   Procedure: LAPAROSCOPY DIAGNOSTIC;  Surgeon: Sherian Rein, MD;  Location: [facility];  Service: Gynecology;  Laterality: N/A;     Current Outpatient Medications:    levonorgestrel (PLAN B 1-STEP) 1.5 MG tablet, Take 1 tablet (1.5 mg total) by mouth once for 1 dose., Disp: 1 tablet, Rfl: 0   LO LOESTRIN FE 1 MG-10 MCG / 10 MCG tablet, Take 1 tablet by mouth daily., Disp: 84 tablet, Rfl: 4   fluconazole (DIFLUCAN) 150 MG tablet, PLEASE SEE ATTACHED FOR DETAILED DIRECTIONS, Disp: 2 tablet, Rfl: 0   hydrOXYzine (ATARAX/VISTARIL) 25 MG tablet, Take 1 tablet (25 mg total) by mouth every 6 (six) hours. (Patient not taking: Reported on 02/03/2021), Disp: 16 tablet, Rfl: 0   ibuprofen (ADVIL) 800  MG tablet, Take 1 tablet (800 mg total) by mouth 3 (three) times daily. (Patient not taking: Reported on 02/03/2021), Disp: 21 tablet, Rfl: 0   metroNIDAZOLE (FLAGYL) 500 MG tablet, TAKE 1 TABLET BY MOUTH TWICE A DAY, Disp: 14 tablet, Rfl: 0   metroNIDAZOLE (METROGEL VAGINAL) 0.75 % vaginal gel, 1 applicator vaginally every night for 5 days., Disp: 70 g, Rfl: 0   Multiple Vitamin (MULTIVITAMIN WITH MINERALS) TABS tablet, Take 1 tablet by mouth daily., Disp: , Rfl:  Allergies  Allergen Reactions   No Known Allergies     Social History   Tobacco Use   Smoking status: Never   Smokeless tobacco: Never  Substance Use Topics   Alcohol use: Never    Family History  Problem Relation Age of Onset   Hypertension Father    Hypertension Maternal Grandmother    Alcohol abuse Neg Hx    Arthritis Neg Hx    Birth defects Neg Hx    Asthma Neg Hx    Cancer Neg Hx    COPD Neg Hx    Depression Neg Hx    Diabetes Neg Hx    Drug abuse Neg Hx    Early death  Neg Hx    Hearing loss Neg Hx    Heart disease Neg Hx    Hyperlipidemia Neg Hx    Kidney disease Neg Hx    Learning disabilities Neg Hx    Mental illness Neg Hx    Mental retardation Neg Hx    Miscarriages / Stillbirths Neg Hx    Vision loss Neg Hx    Stroke Neg Hx        Review of Systems Constitutional: negative for weight loss Genitourinary:negative for abnormal menstrual periods and vaginal discharge   Objective:   BP 126/85   Pulse (!) 114   Wt 123 lb 14.4 oz (56.2 kg)   BMI 21.95 kg/m    General:   Alert and no distress  Skin:   no rash or abnormalities  Lungs:   clear to auscultation bilaterally  Heart:   regular rate and rhythm, S1, S2 normal, no murmur, click, rub or gallop  Breasts:   normal without suspicious masses, skin or nipple changes or axillary nodes  Abdomen:  normal findings: no organomegaly, soft, non-tender and no hernia  Pelvis:  External genitalia: normal general appearance Urinary system: urethral  meatus normal and bladder without fullness, nontender Vaginal: normal without tenderness, induration or masses Cervix: normal appearance Adnexa: normal bimanual exam Uterus: anteverted and non-tender, normal size   Lab Review Urine pregnancy test Labs reviewed yes Radiologic studies reviewed no  I have spent a total of 15 minutes of face-to-face time, excluding clinical staff time, reviewing notes and preparing to see patient, ordering tests and/or medications, and counseling the patient.   Assessment:    30 y.o., starting OCP (estrogen/progesterone), no contraindications.   Plan:    All questions answered. Contraception: OCP (estrogen/progesterone). Discussed healthy lifestyle modifications. Follow up in 3 months.   Meds ordered this encounter  Medications   LO LOESTRIN FE 1 MG-10 MCG / 10 MCG tablet    Sig: Take 1 tablet by mouth daily.    Dispense:  84 tablet    Refill:  4    Submit other coverage code 3  BIN:  [id number]  PCN:  [id number]   GRP:  [id number]   ID:  [id number] Start  taking on the 1st day of menstrual period.   levonorgestrel (PLAN B 1-STEP) 1.5 MG tablet    Sig: Take 1 tablet (1.5 mg total) by mouth once for 1 dose.    Dispense:  1 tablet    Refill:  0    Brock Bad, MD 04/18/2021 2:34 PM

## 2021-07-06 ENCOUNTER — Other Ambulatory Visit: Payer: Self-pay | Admitting: Obstetrics

## 2021-07-06 ENCOUNTER — Other Ambulatory Visit: Payer: Self-pay

## 2021-07-06 DIAGNOSIS — N898 Other specified noninflammatory disorders of vagina: Secondary | ICD-10-CM

## 2021-07-06 DIAGNOSIS — B3731 Acute candidiasis of vulva and vagina: Secondary | ICD-10-CM

## 2021-07-06 MED ORDER — FLUCONAZOLE 150 MG PO TABS
150.0000 mg | ORAL_TABLET | Freq: Once | ORAL | 0 refills | Status: AC
Start: 2021-07-06 — End: 2021-07-06

## 2021-07-06 NOTE — Progress Notes (Signed)
Pt c/o yeast infection and is requesting another Diflucan. Diflucan sent per protocol.

## 2021-08-01 ENCOUNTER — Other Ambulatory Visit: Payer: Self-pay | Admitting: Obstetrics

## 2021-08-01 DIAGNOSIS — B9689 Other specified bacterial agents as the cause of diseases classified elsewhere: Secondary | ICD-10-CM

## 2021-08-26 ENCOUNTER — Other Ambulatory Visit: Payer: Self-pay | Admitting: Obstetrics and Gynecology

## 2021-08-26 DIAGNOSIS — N898 Other specified noninflammatory disorders of vagina: Secondary | ICD-10-CM

## 2021-08-31 ENCOUNTER — Ambulatory Visit (INDEPENDENT_AMBULATORY_CARE_PROVIDER_SITE_OTHER): Payer: Medicaid Other | Admitting: Family Medicine

## 2021-08-31 ENCOUNTER — Other Ambulatory Visit (HOSPITAL_COMMUNITY)
Admission: RE | Admit: 2021-08-31 | Discharge: 2021-08-31 | Disposition: A | Discharge status: Home / Self Care | Payer: Medicaid Other | Source: Ambulatory Visit | Attending: Family Medicine | Admitting: Family Medicine

## 2021-08-31 ENCOUNTER — Other Ambulatory Visit: Payer: Self-pay

## 2021-08-31 ENCOUNTER — Encounter: Payer: Self-pay | Admitting: Family Medicine

## 2021-08-31 ENCOUNTER — Encounter: Payer: Self-pay | Admitting: Obstetrics

## 2021-08-31 VITALS — BP 130/70 | HR 80 | Ht 67.0 in | Wt 121.0 lb

## 2021-08-31 DIAGNOSIS — Z113 Encounter for screening for infections with a predominantly sexual mode of transmission: Secondary | ICD-10-CM | POA: Diagnosis present

## 2021-08-31 DIAGNOSIS — N898 Other specified noninflammatory disorders of vagina: Secondary | ICD-10-CM

## 2021-08-31 DIAGNOSIS — N930 Postcoital and contact bleeding: Secondary | ICD-10-CM

## 2021-08-31 DIAGNOSIS — Z124 Encounter for screening for malignant neoplasm of cervix: Secondary | ICD-10-CM | POA: Diagnosis not present

## 2021-08-31 DIAGNOSIS — F419 Anxiety disorder, unspecified: Secondary | ICD-10-CM

## 2021-08-31 NOTE — Progress Notes (Signed)
Pt presents for annual reports yeast infections prior to each period and spotting 1-2 weeks before cycles begins and also spotting 1 week after cycle ends.  ?Pt is also experiencing postcoital bleeding.  ?Pt requests all STD testing. ?She is not interested in [employer]; not trying to conceive.  ?PHQ9= 0 ?GAD7= 10 - pt is interested in counseling services.  ?Normal pap 06/2020 with PCP at [facility] per pt  ?

## 2021-08-31 NOTE — Progress Notes (Signed)
? ?GYNECOLOGY OFFICE VISIT NOTE ? ?History:  ? Amy Mclean is a 31 y.o. [phone] here today for an annual exam.  ? ?Spotting after intercourse: ?- Going on for months now ?- Notices it most just before and just after her period ?- Denies painful intercourse ?- Spotting resolves within a few days, periods otherwise normal and occurring regularly ?- No form of contraception currently; does not wish to pursue birth control of any kind  ?- Reports having vaginal discharge ?- Hx of BV and yeast quite frequently in the past  ?- Using Dove soap in the genital region ?- Wondering if an infection could be causing this problem ?- Requesting STI screening  ?  ?Past Medical History:  ?Diagnosis Date  ? Adnexal mass 12/30/2015  ? Anemia   ? Anxiety   ? Chlamydia   ? High blood pressure   ? History of dysphagia   ? History of severe postpartum preeclampsia in prior pregnancy, currently pregnant 09/01/2014  ? Hx of varicella   ? Left ovarian cyst   ? Trichomonas vaginitis 2017  ? Urinary tract infection   ? ? ?Past Surgical History:  ?Procedure Laterality Date  ? LAPAROSCOPY N/A 12/30/2015  ? Procedure: LAPAROSCOPY DIAGNOSTIC;  Surgeon: Janyth Contes, MD;  Location: [facility];  Service: Gynecology;  Laterality: N/A;  ? ? ?The following portions of the patient's history were reviewed and updated as appropriate: allergies, current medications, past family history, past medical history, past social history, past surgical history and problem list.  ? ?Health Maintenance:  Patient reports normal pap smear 2022 with FM doctor during a physical. Unavailable for review per records.  ? ?Review of Systems:  ?Pertinent items noted in HPI and remainder of comprehensive ROS otherwise negative. ? ?Physical Exam:  ?BP 130/70   Pulse 80   Ht 5\' 7"  (1.702 m)   Wt 121 lb (54.9 kg)   LMP 08/17/2021   BMI 18.95 kg/m?  ? ?CONSTITUTIONAL: Well-developed, well-nourished female in no acute distress.  ?HEENT:  Normocephalic, atraumatic.  EOMI, conjunctivae clear. ?CARDIOVASCULAR: Normal heart rate noted. ?RESPIRATORY: Normal work of breathing on room air.  ?ABDOMEN: Soft, nontender, nondistended, no masses palpated.  ?PELVIC: Normal appearing external genitalia; normal urethral meatus; normal appearing vaginal mucosa and cervix.  No CMT.  White, clumpy vaginal discharge present.  Performed in the presence of a chaperone. ?SKIN: No rashes or lesions noted. ?MUSCULOSKELETAL: Normal range of motion. No LE edema noted. ?NEUROLOGIC: Alert and oriented to person, place, and time. No focal deficit noted.  ?PSYCHIATRIC: Normal mood and affect. Normal behavior. Normal judgment and thought content. ? ?PHQ9 - 0  ?GAD7 - 10 ? ?Assessment and Plan:  ? ?1. Cervical cancer screening ?Last pap smear in medical record from 03/2019 and normal. Patient reports repeat pap smear normal in 2022 with her FM physician. Records not available. Patient is sure that this was a pap smear and does not wish to repeat today. Advised patient to call her FM office to confirm completion of pap smear and normal results. Advised to obtain copy so this can be faxed to our office. If not completed, will plan to complete at follow up visit.  ? ?2. Routine screening for STI (sexually transmitted infection) ?3. Vaginal discharge ?White, clumpy vaginal discharge present on exam. Otherwise healthy appearing vagina and cervix. Will follow up results of vaginal swab and STI screening. Plan to treat as necessary.  ?- Cervicovaginal ancillary only( ) ?- HepB+HepC+HIV Panel ?- RPR ? ?4.  Postcoital bleeding ?Discussed possible etiologies with patient. No sign of cervical mass/lesion, no cervical ectropion. Will rule out infectious cause with testing above. Will plan to treat as indicated. Discussed that if symptoms persist, will plan to proceed with TVUS to rule out other structural cause. Will also make sure pap smear is up to date as noted above. Plan to reassess in 3 months pending  results.  ? ?5. Anxiety ?Interested in counseling, referred as below.  ?- Ambulatory referral to [facility] ? ?Routine preventative health maintenance measures emphasized. ? ?Please refer to After Visit Summary for other counseling recommendations.  ? ?Return in about 3 months (around 12/01/2021) for for follow up pending results.   ? ?Vilma Meckel, MD ?OB Fellow, Faculty Practice ?[place] [facility] ?08/31/2021 1:17 PM ? ?

## 2021-09-01 LAB — HEPB+HEPC+HIV PANEL
HIV Screen 4th Generation wRfx: NONREACTIVE
Hep B C IgM: NEGATIVE
Hep B Core Total Ab: NEGATIVE
Hep B E Ab: NEGATIVE
Hep B E Ag: NEGATIVE
Hep B Surface Ab, Qual: REACTIVE
Hep C Virus Ab: NONREACTIVE
Hepatitis B Surface Ag: NEGATIVE

## 2021-09-01 LAB — RPR: RPR Ser Ql: NONREACTIVE

## 2021-09-04 ENCOUNTER — Other Ambulatory Visit: Payer: Self-pay | Admitting: Family Medicine

## 2021-09-04 DIAGNOSIS — B3731 Acute candidiasis of vulva and vagina: Secondary | ICD-10-CM

## 2021-09-04 DIAGNOSIS — B9689 Other specified bacterial agents as the cause of diseases classified elsewhere: Secondary | ICD-10-CM

## 2021-09-04 LAB — CERVICOVAGINAL ANCILLARY ONLY
Bacterial Vaginitis (gardnerella): POSITIVE — AB
Candida Glabrata: NEGATIVE
Candida Vaginitis: POSITIVE — AB
Chlamydia: NEGATIVE
Comment: NEGATIVE
Comment: NEGATIVE
Comment: NEGATIVE
Comment: NEGATIVE
Comment: NEGATIVE
Comment: NORMAL
Neisseria Gonorrhea: NEGATIVE
Trichomonas: NEGATIVE

## 2021-09-04 MED ORDER — METRONIDAZOLE 0.75 % VA GEL: 1.0000 | Freq: Every day | VAGINAL | 0 refills | Status: DC

## 2021-09-04 MED ORDER — METRONIDAZOLE 500 MG PO TABS: 500.0000 mg | ORAL_TABLET | Freq: Two times a day (BID) | ORAL | 0 refills | Status: DC

## 2021-09-04 MED ORDER — FLUCONAZOLE 150 MG PO TABS: 150.0000 mg | ORAL_TABLET | Freq: Once | ORAL | 1 refills | Status: DC

## 2021-09-04 NOTE — Progress Notes (Signed)
Swithc to metrogel ?

## 2021-09-06 ENCOUNTER — Telehealth: Payer: Self-pay | Admitting: Licensed Clinical Social Worker

## 2021-09-06 ENCOUNTER — Encounter: Payer: Medicaid Other | Admitting: Licensed Clinical Social Worker

## 2021-09-06 NOTE — Telephone Encounter (Signed)
Called pt regarding scheduled mychart visit. Left message for callback  

## 2021-09-20 ENCOUNTER — Encounter: Payer: Medicaid Other | Admitting: Licensed Clinical Social Worker

## 2021-09-21 ENCOUNTER — Ambulatory Visit (INDEPENDENT_AMBULATORY_CARE_PROVIDER_SITE_OTHER): Payer: Medicaid Other | Admitting: Licensed Clinical Social Worker

## 2021-09-21 DIAGNOSIS — F419 Anxiety disorder, unspecified: Secondary | ICD-10-CM | POA: Diagnosis not present

## 2021-09-21 NOTE — BH Specialist Note (Signed)
Integrated Behavioral Health via Telemedicine Visit ? ?09/21/2021 ?[name] ?[id number] ? ?Number of Integrated Behavioral Health Clinician visits: No data recorded ?Session Start time: 0100 ?  ?Session End time: 0125 ? ?Total time in minutes: 25 ?Via [employer] video  ? ?Referring Provider: Dr Mathis Fare  ?Patient/Family location: Home  ?[facility] Provider location: [facility]  ?All persons participating in visit: Pt A Macmaster and LCSW A.Felton Clinton  ?Types of Service: Individual psychotherapy and Video visit ? ?I connected with [name] and/or [name]'s n/a via  Telephone or Video Enabled Telemedicine Application  (Video is Caregility application) and verified that I am speaking with the correct person using two identifiers. Discussed confidentiality: Yes  ? ?I discussed the limitations of telemedicine and the availability of in person appointments.  Discussed there is a possibility of technology failure and discussed alternative modes of communication if that failure occurs. ? ?I discussed that engaging in this telemedicine visit, they consent to the provision of behavioral healthcare and the services will be billed under their insurance. ? ?Patient and/or legal guardian expressed understanding and consented to Telemedicine visit: Yes  ? ?Presenting Concerns: ?Patient and/or family reports the following symptoms/concerns: depressed mood ?Duration of problem: approx ; Severity of problem: mild ? ?Patient and/or Family's Strengths/Protective Factors: ?Concrete supports in place (healthy food, safe environments, etc.) ? ?Goals Addressed: ?Patient will: ? Reduce symptoms of: anxiety  ? Increase knowledge and/or ability of: coping skills  ? Demonstrate ability to: Increase healthy adjustment to current life circumstances ? ?Progress towards Goals: ?Ongoing ? ?Interventions: ?Interventions utilized:  Supportive Counseling ?Standardized Assessments completed: PHQ 9 ?Patient and/or Family  Response: Amy Mclean responsed well to visit.  ? ?Assessment: ?Patient currently experiencing anxiety.  ? ?Patient may benefit from integrated behavioral health. ? ?Plan: ?Follow up with behavioral health clinician on : 10/12/2021 ?Behavioral recommendations: Communicate needs, prioritize rest and mindfulness techniques to reduce stress  ?Referral(s): Integrated [employer] (In Clinic) ? ?I discussed the assessment and treatment plan with the patient and/or parent/guardian. They were provided an opportunity to ask questions and all were answered. They agreed with the plan and demonstrated an understanding of the instructions. ?  ?They were advised to call back or seek an in-person evaluation if the symptoms worsen or if the condition fails to improve as anticipated. ? ?Gwyndolyn Saxon, LCSW ?

## 2021-09-22 ENCOUNTER — Other Ambulatory Visit: Payer: Self-pay | Admitting: Family Medicine

## 2021-10-12 ENCOUNTER — Encounter: Payer: Medicaid Other | Admitting: Licensed Clinical Social Worker

## 2021-11-21 ENCOUNTER — Ambulatory Visit: Payer: Medicaid Other

## 2021-11-21 ENCOUNTER — Telehealth: Payer: Self-pay | Admitting: Emergency Medicine

## 2021-11-21 DIAGNOSIS — N760 Acute vaginitis: Secondary | ICD-10-CM

## 2021-11-21 MED ORDER — METRONIDAZOLE 0.75 % VA GEL: 1.0000 | Freq: Every day | VAGINAL | 1 refills | Status: DC

## 2021-11-21 NOTE — Telephone Encounter (Signed)
Rx for BV sent to pharmacy. Pt reports vaginal odor, discharge. States it is like BV that she has had before.

## 2022-05-08 ENCOUNTER — Encounter: Payer: Self-pay | Admitting: Obstetrics and Gynecology

## 2022-05-08 ENCOUNTER — Ambulatory Visit (INDEPENDENT_AMBULATORY_CARE_PROVIDER_SITE_OTHER): Payer: Medicaid Other

## 2022-05-08 ENCOUNTER — Other Ambulatory Visit (HOSPITAL_COMMUNITY)
Admission: RE | Admit: 2022-05-08 | Discharge: 2022-05-08 | Disposition: A | Discharge status: Home / Self Care | Payer: Medicaid Other | Source: Ambulatory Visit | Attending: Obstetrics and Gynecology | Admitting: Obstetrics and Gynecology

## 2022-05-08 DIAGNOSIS — N898 Other specified noninflammatory disorders of vagina: Secondary | ICD-10-CM

## 2022-05-08 NOTE — Progress Notes (Signed)
SUBJECTIVE:  31 y.o. female complains of creamy, malodorous, and thick vaginal discharge for 10 day(s). Denies abnormal vaginal bleeding or significant pelvic pain or fever. No UTI symptoms. Denies history of known exposure to STD.  No LMP recorded.  OBJECTIVE:  She appears well, afebrile. Urine dipstick: not done.  ASSESSMENT:  Vaginal Discharge  Vaginal Odor   PLAN:  GC, chlamydia, trichomonas, BVAG, CVAG probe sent to lab. Treatment: To be determined once lab results are received ROV prn if symptoms persist or worsen.

## 2022-05-09 LAB — CERVICOVAGINAL ANCILLARY ONLY
Bacterial Vaginitis (gardnerella): POSITIVE — AB
Candida Glabrata: NEGATIVE
Candida Vaginitis: POSITIVE — AB
Chlamydia: NEGATIVE
Comment: NEGATIVE
Comment: NEGATIVE
Comment: NEGATIVE
Comment: NEGATIVE
Comment: NEGATIVE
Comment: NORMAL
Neisseria Gonorrhea: NEGATIVE
Trichomonas: NEGATIVE

## 2022-05-14 ENCOUNTER — Other Ambulatory Visit: Payer: Self-pay

## 2022-05-14 DIAGNOSIS — N760 Acute vaginitis: Secondary | ICD-10-CM

## 2022-05-14 DIAGNOSIS — N898 Other specified noninflammatory disorders of vagina: Secondary | ICD-10-CM

## 2022-05-14 MED ORDER — FLUCONAZOLE 150 MG PO TABS: 150.0000 mg | ORAL_TABLET | Freq: Once | ORAL | 0 refills | Status: AC

## 2022-05-14 MED ORDER — METRONIDAZOLE 500 MG PO TABS: 500.0000 mg | ORAL_TABLET | Freq: Two times a day (BID) | ORAL | 0 refills | Status: AC

## 2022-05-14 MED ORDER — METRONIDAZOLE 0.75 % VA GEL: 1.0000 | Freq: Every day | VAGINAL | 1 refills | Status: DC

## 2022-05-16 ENCOUNTER — Other Ambulatory Visit: Payer: Self-pay | Admitting: Emergency Medicine

## 2022-05-16 MED ORDER — TERCONAZOLE 0.8 % VA CREA: 1.0000 | TOPICAL_CREAM | Freq: Every day | VAGINAL | 0 refills | Status: DC
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
## 2022-10-30 ENCOUNTER — Ambulatory Visit: Payer: Medicaid Other | Admitting: Obstetrics

## 2022-10-31 ENCOUNTER — Ambulatory Visit: Payer: Medicaid Other | Admitting: Obstetrics

## 2022-10-31 ENCOUNTER — Encounter: Payer: Self-pay | Admitting: Obstetrics

## 2022-10-31 VITALS — BP 114/84 | HR 107 | Ht 67.0 in | Wt 122.0 lb

## 2022-10-31 DIAGNOSIS — R300 Dysuria: Secondary | ICD-10-CM | POA: Diagnosis not present

## 2022-10-31 LAB — POCT URINALYSIS DIPSTICK
Bilirubin, UA: NEGATIVE
Blood, UA: NEGATIVE
Glucose, UA: NEGATIVE
Leukocytes, UA: NEGATIVE
Nitrite, UA: NEGATIVE
Protein, UA: NEGATIVE
Spec Grav, UA: 1.015 (ref 1.010–1.025)
Urobilinogen, UA: 0.2 E.U./dL
pH, UA: 5 (ref 5.0–8.0)

## 2022-10-31 MED ORDER — NITROFURANTOIN MONOHYD MACRO 100 MG PO CAPS: 100.0000 mg | ORAL_CAPSULE | Freq: Two times a day (BID) | ORAL | 2 refills | Status: DC

## 2022-10-31 NOTE — Progress Notes (Signed)
Patient ID: Amy Mclean, female   DOB: 26-Nov-1990, 32 y.o.   MRN: [id number]  Chief Complaint  Patient presents with   Urinary Tract Infection    HPI Amy Mclean is a 32 y.o. female.  Complains of burning with urination and urinary frequency. HPI  Past Medical History:  Diagnosis Date   Adnexal mass 12/30/2015   Anemia    Anxiety    Chlamydia    High blood pressure    History of dysphagia    History of severe postpartum preeclampsia in prior pregnancy, currently pregnant 09/01/2014   Hx of varicella    Left ovarian cyst    Trichomonas vaginitis 2017   Urinary tract infection     Past Surgical History:  Procedure Laterality Date   LAPAROSCOPY N/A 12/30/2015   Procedure: LAPAROSCOPY DIAGNOSTIC;  Surgeon: Sherian Rein, MD;  Location: [facility];  Service: Gynecology;  Laterality: N/A;    Family History  Problem Relation Age of Onset   Hypertension Father    Hypertension Maternal Grandmother    Alcohol abuse Neg Hx    Arthritis Neg Hx    Birth defects Neg Hx    Asthma Neg Hx    Cancer Neg Hx    COPD Neg Hx    Depression Neg Hx    Diabetes Neg Hx    Drug abuse Neg Hx    Early death Neg Hx    Hearing loss Neg Hx    Heart disease Neg Hx    Hyperlipidemia Neg Hx    Kidney disease Neg Hx    Learning disabilities Neg Hx    Mental illness Neg Hx    Mental retardation Neg Hx    Miscarriages / Stillbirths Neg Hx    Vision loss Neg Hx    Stroke Neg Hx     Social History Social History   Tobacco Use   Smoking status: Never   Smokeless tobacco: Never  Vaping Use   Vaping Use: Never used  Substance Use Topics   Alcohol use: Never   Drug use: No    Allergies  Allergen Reactions   No Known Allergies     Current Outpatient Medications  Medication Sig Dispense Refill   nitrofurantoin, macrocrystal-monohydrate, (MACROBID) 100 MG capsule Take 1 capsule (100 mg total) by mouth 2 (two) times daily. 1 po BID x 7days 14 capsule 2   terconazole  (TERAZOL 3) 0.8 % vaginal cream Place 1 applicator vaginally at bedtime. Apply nightly for three nights. 20 g 0   metroNIDAZOLE (METROGEL) 0.75 % vaginal gel Place 1 Applicatorful vaginally at bedtime. Apply one applicatorful to vagina at bedtime for 5 days 70 g 1   No current facility-administered medications for this visit.    Review of Systems Review of Systems Constitutional: negative for fatigue and weight loss Respiratory: negative for cough and wheezing Cardiovascular: negative for chest pain, fatigue and palpitations Gastrointestinal: negative for abdominal pain and change in bowel habits Genitourinary: positive for UTI symptoms Integument/breast: negative for nipple discharge Musculoskeletal:negative for myalgias Neurological: negative for gait problems and tremors Behavioral/Psych: negative for abusive relationship, depression Endocrine: negative for temperature intolerance      Blood pressure 114/84, pulse (!) 107, height 5\' 7"  (1.702 m), weight 122 lb (55.3 kg), last menstrual period 10/19/2022.  Physical Exam Physical Exam General:   Alert and no distress  Skin:   no rash or abnormalities  Lungs:   clear to auscultation bilaterally  Heart:   regular rate and rhythm,  S1, S2 normal, no murmur, click, rub or gallop  Pelvic Exam:  Deferred  I have spent a total of 20 minutes of face-to-face time, excluding clinical staff time, reviewing notes and preparing to see patient, ordering tests and/or medications, and counseling the patient.   Data Reviewed Urinalysis  Assessment     1. Dysuria Rx: - POCT Urinalysis Dipstick - Urine Culture - nitrofurantoin, macrocrystal-monohydrate, (MACROBID) 100 MG capsule; Take 1 capsule (100 mg total) by mouth 2 (two) times daily. 1 po BID x 7days  Dispense: 14 capsule; Refill: 2     Plan   Follow up prn  Orders Placed This Encounter  Procedures   Urine Culture   POCT Urinalysis Dipstick   Meds ordered this encounter   Medications   nitrofurantoin, macrocrystal-monohydrate, (MACROBID) 100 MG capsule    Sig: Take 1 capsule (100 mg total) by mouth 2 (two) times daily. 1 po BID x 7days    Dispense:  14 capsule    Refill:  2     Brock Bad, MD 10/31/2022 11:40 AM

## 2022-10-31 NOTE — Progress Notes (Signed)
UTI symptoms: dysuria, foul odor to urine, cloudy per pt. No fever or flank pain.

## 2022-11-03 LAB — URINE CULTURE

## 2022-11-21 ENCOUNTER — Other Ambulatory Visit: Payer: Self-pay | Admitting: *Deleted

## 2022-11-21 DIAGNOSIS — N898 Other specified noninflammatory disorders of vagina: Secondary | ICD-10-CM

## 2022-11-21 MED ORDER — FLUCONAZOLE 150 MG PO TABS: 150.0000 mg | ORAL_TABLET | Freq: Once | ORAL | 0 refills | Status: AC

## 2022-11-21 NOTE — Progress Notes (Signed)
TC from pt reporting symptoms of yeast infection. RX per protocol.

## 2022-12-10 ENCOUNTER — Ambulatory Visit: Payer: Medicaid Other

## 2022-12-14 ENCOUNTER — Ambulatory Visit: Payer: Medicaid Other

## 2022-12-24 ENCOUNTER — Other Ambulatory Visit (HOSPITAL_COMMUNITY)
Admission: RE | Admit: 2022-12-24 | Discharge: 2022-12-24 | Disposition: A | Discharge status: Home / Self Care | Payer: Medicaid Other | Source: Ambulatory Visit | Attending: Obstetrics and Gynecology | Admitting: Obstetrics and Gynecology

## 2022-12-24 ENCOUNTER — Ambulatory Visit (INDEPENDENT_AMBULATORY_CARE_PROVIDER_SITE_OTHER): Payer: Medicaid Other | Admitting: *Deleted

## 2022-12-24 DIAGNOSIS — N898 Other specified noninflammatory disorders of vagina: Secondary | ICD-10-CM

## 2022-12-24 NOTE — Progress Notes (Signed)
SUBJECTIVE:  32 y.o. female who desires a STI screen.    Pt stating that she thinks she may have BV, did use Vaginal Boric acid which helped some.  Whiteish/Grey abnormal vaginal discharge Pt also notes Vaginal odor   Denies bleeding or significant pelvic pain. No UTI symptoms.   Denies history of known exposure to STD.  No LMP recorded.  OBJECTIVE:  She appears well.   ASSESSMENT:  STI Screen   PLAN:  Pt offered STI blood screening-not indicated GC, chlamydia, and trichomonas probe sent to lab.  Treatment: To be determined once lab results are received.  Pt follow up as needed.

## 2022-12-25 LAB — CERVICOVAGINAL ANCILLARY ONLY
Bacterial Vaginitis (gardnerella): NEGATIVE
Candida Glabrata: NEGATIVE
Candida Vaginitis: NEGATIVE
Chlamydia: POSITIVE — AB
Comment: NEGATIVE
Comment: NEGATIVE
Comment: NEGATIVE
Comment: NEGATIVE
Comment: NEGATIVE
Comment: NORMAL
Neisseria Gonorrhea: NEGATIVE
Trichomonas: NEGATIVE

## 2022-12-25 NOTE — Progress Notes (Signed)
Patient was assessed and managed by nursing staff during this encounter. I have reviewed the chart and agree with the documentation and plan. I have also made any necessary editorial changes.  Yossef Gilkison A Xitlaly Ault, MD 12/25/2022 1:09 PM   

## 2022-12-27 ENCOUNTER — Other Ambulatory Visit: Payer: Self-pay | Admitting: Emergency Medicine

## 2022-12-27 ENCOUNTER — Encounter: Payer: Self-pay | Admitting: Emergency Medicine

## 2022-12-27 MED ORDER — AZITHROMYCIN 500 MG PO TABS: 1000.0000 mg | ORAL_TABLET | Freq: Once | ORAL | 0 refills | Status: AC

## 2022-12-27 MED ORDER — DOXYCYCLINE HYCLATE 100 MG PO CAPS: 100.0000 mg | ORAL_CAPSULE | Freq: Two times a day (BID) | ORAL | 0 refills | Status: DC

## 2022-12-27 NOTE — Progress Notes (Signed)
Rx for chlamydia 

## 2022-12-27 NOTE — Progress Notes (Signed)
Azithromycin sent for chlamydia tx. Pt states she can't swallow Doxycycline tablets

## 2023-01-24 ENCOUNTER — Telehealth: Payer: Self-pay

## 2023-01-24 ENCOUNTER — Other Ambulatory Visit: Payer: Self-pay

## 2023-01-24 DIAGNOSIS — A749 Chlamydial infection, unspecified: Secondary | ICD-10-CM

## 2023-01-24 MED ORDER — DOXYCYCLINE HYCLATE 100 MG PO CAPS: 100.0000 mg | ORAL_CAPSULE | Freq: Two times a day (BID) | ORAL | 0 refills | Status: DC

## 2023-01-24 NOTE — Telephone Encounter (Signed)
Patient called requesting a new rx for doxycycline. She states that she was letting the tablets disintegrate on her tongue which was causing her to vomit. Patient advised to swallow tablet with a glass of water as soon as she puts the tablet in her mouth.

## 2023-02-25 ENCOUNTER — Ambulatory Visit: Payer: Medicaid Other

## 2023-02-28 ENCOUNTER — Ambulatory Visit: Payer: Medicaid Other

## 2023-03-12 ENCOUNTER — Ambulatory Visit: Payer: Medicaid Other

## 2023-04-12 ENCOUNTER — Ambulatory Visit: Payer: Medicaid Other | Admitting: Emergency Medicine

## 2023-04-12 ENCOUNTER — Other Ambulatory Visit (HOSPITAL_COMMUNITY)
Admission: RE | Admit: 2023-04-12 | Discharge: 2023-04-12 | Disposition: A | Discharge status: Home / Self Care | Payer: Medicaid Other | Source: Ambulatory Visit | Attending: Obstetrics and Gynecology | Admitting: Obstetrics and Gynecology

## 2023-04-12 VITALS — BP 121/87 | HR 103 | Wt 122.0 lb

## 2023-04-12 DIAGNOSIS — N898 Other specified noninflammatory disorders of vagina: Secondary | ICD-10-CM | POA: Diagnosis present

## 2023-04-12 NOTE — Progress Notes (Signed)
SUBJECTIVE:  32 y.o. female complains of vaginal odor for 2 days. Denies abnormal vaginal bleeding or significant pelvic pain or fever. No UTI symptoms. Denies history of known exposure to STD.   No LMP recorded.  OBJECTIVE:  She appears well, afebrile. Urine dipstick: not done.  ASSESSMENT:  Vaginal Discharge  Vaginal Odor   PLAN:  GC, chlamydia, trichomonas, BVAG, CVAG probe sent to lab. Treatment: To be determined once lab results are received ROV prn if symptoms persist or worsen.

## 2023-04-15 ENCOUNTER — Other Ambulatory Visit: Payer: Self-pay | Admitting: *Deleted

## 2023-04-15 DIAGNOSIS — B9689 Other specified bacterial agents as the cause of diseases classified elsewhere: Secondary | ICD-10-CM

## 2023-04-15 LAB — CERVICOVAGINAL ANCILLARY ONLY
Bacterial Vaginitis (gardnerella): POSITIVE — AB
Candida Glabrata: NEGATIVE
Candida Vaginitis: NEGATIVE
Chlamydia: NEGATIVE
Comment: NEGATIVE
Comment: NEGATIVE
Comment: NEGATIVE
Comment: NEGATIVE
Comment: NEGATIVE
Comment: NORMAL
Neisseria Gonorrhea: NEGATIVE
Trichomonas: NEGATIVE

## 2023-04-15 MED ORDER — METRONIDAZOLE 500 MG PO TABS: 500.0000 mg | ORAL_TABLET | Freq: Two times a day (BID) | ORAL | 0 refills | Status: DC

## 2023-04-15 MED ORDER — METRONIDAZOLE 0.75 % VA GEL: 1.0000 | Freq: Every day | VAGINAL | 1 refills | Status: DC

## 2023-04-15 NOTE — Progress Notes (Signed)
Pt request RX metrogel instead of po flagyl. RX sent per protocol.

## 2023-04-15 NOTE — Addendum Note (Signed)
Addended by: Catalina Antigua on: 04/15/2023 12:54 PM   Modules accepted: Orders

## 2023-06-24 ENCOUNTER — Encounter: Payer: Self-pay | Admitting: *Deleted

## 2023-06-24 ENCOUNTER — Other Ambulatory Visit: Payer: Self-pay

## 2023-06-24 ENCOUNTER — Ambulatory Visit
Admission: EM | Admit: 2023-06-24 | Discharge: 2023-06-24 | Disposition: A | Discharge status: Home / Self Care | Payer: Medicaid Other | Attending: Family Medicine | Admitting: Family Medicine

## 2023-06-24 DIAGNOSIS — Z113 Encounter for screening for infections with a predominantly sexual mode of transmission: Secondary | ICD-10-CM

## 2023-06-24 DIAGNOSIS — R300 Dysuria: Secondary | ICD-10-CM | POA: Diagnosis present

## 2023-06-24 LAB — POCT URINALYSIS DIP (MANUAL ENTRY)
Bilirubin, UA: NEGATIVE
Glucose, UA: NEGATIVE mg/dL
Ketones, POC UA: NEGATIVE mg/dL
Leukocytes, UA: NEGATIVE
Nitrite, UA: POSITIVE — AB
Protein Ur, POC: NEGATIVE mg/dL
Spec Grav, UA: >=1.03 — AB (ref 1.010–1.025)
Urobilinogen, UA: 0.2 U/dL
pH, UA: 6 (ref 5.0–8.0)

## 2023-06-24 LAB — POCT URINE PREGNANCY: Preg Test, Ur: NEGATIVE

## 2023-06-24 MED ORDER — DOXYCYCLINE HYCLATE 100 MG PO CAPS: 100.0000 mg | ORAL_CAPSULE | Freq: Two times a day (BID) | ORAL | 0 refills | Status: DC

## 2023-06-24 NOTE — ED Provider Notes (Signed)
 [facility]    CSN: [id number] Arrival date & time: 06/24/23  1803      History   Chief Complaint Chief Complaint  Patient presents with   Urinary Frequency    HPI Amy Mclean is a 33 y.o. female.   Amy Mclean is a 33 y.o. female presents for evaluation of urinary frequency, urgency and dysuria x 1 days, without flank pain, fever, chills, or abnormal vaginal discharge or bleeding. Reports tested positive for chlamydia back in October however complete entire course of treatment.  She endorses itching but no changes to vaginal discharge.  She has not had any new sexual partners.  Patient did have a UTI back in June 2024 urine culture was positive for E. coli reports she did not complete all of the antibiotics she was prescribed Macrobid  at that time.  She reports the pills were too large for her to swallow. . Patient's last menstrual period was 06/24/2023.    Past Medical History:  Diagnosis Date   Adnexal mass 12/30/2015   Anemia    Anxiety    Chlamydia    High blood pressure    History of dysphagia    History of severe postpartum preeclampsia in prior pregnancy, currently pregnant 09/01/2014   Hx of varicella    Left ovarian cyst    Trichomonas vaginitis 2017   Urinary tract infection     Patient Active Problem List   Diagnosis Date Noted   Anemia 01/03/2017    Past Surgical History:  Procedure Laterality Date   LAPAROSCOPY N/A 12/30/2015   Procedure: LAPAROSCOPY DIAGNOSTIC;  Surgeon: Ezzie Buba, MD;  Location: [facility];  Service: Gynecology;  Laterality: N/A;    OB History     Gravida  9   Para  4   Term  4   Preterm  0   AB  5   Living  4      SAB  2   IAB  3   Ectopic  0   Multiple      Live Births  4            Home Medications    Prior to Admission medications   Medication Sig Start Date End Date Taking? Authorizing Provider  doxycycline  (VIBRAMYCIN ) 100 MG capsule Take 1 capsule (100 mg  total) by mouth 2 (two) times daily for 5 days. 06/24/23 06/29/23 Yes Arloa Suzen RAMAN, NP  terconazole  (TERAZOL 3 ) 0.8 % vaginal cream Place 1 applicator vaginally at bedtime. Apply nightly for three nights. Patient not taking: Reported on 12/24/2022 05/16/22   Ervin, Michael L, MD  metroNIDAZOLE  (FLAGYL ) 500 MG tablet Take 1 tablet (500 mg total) by mouth 2 (two) times daily. Patient not taking: Reported on 06/24/2023 04/15/23   Constant, Peggy, MD  metroNIDAZOLE  (METROGEL ) 0.75 % vaginal gel Place 1 Applicatorful vaginally at bedtime. Apply one applicatorful to vagina at bedtime for 5 days Patient not taking: Reported on 12/24/2022 05/14/22   Zina Jerilynn LABOR, MD  metroNIDAZOLE  (METROGEL ) 0.75 % vaginal gel Place 1 Applicatorful vaginally at bedtime. Apply one applicatorful to vagina at bedtime for 5 days Patient not taking: Reported on 06/24/2023 04/15/23   Constant, Peggy, MD  nitrofurantoin , macrocrystal-monohydrate, (MACROBID ) 100 MG capsule Take 1 capsule (100 mg total) by mouth 2 (two) times daily. 1 po BID x 7days Patient not taking: Reported on 12/24/2022 10/31/22   Rudy Carlin LABOR, MD    Family History Family History  Problem Relation Age of Onset  Hypertension Father    Hypertension Maternal Grandmother    Alcohol abuse Neg Hx    Arthritis Neg Hx    Birth defects Neg Hx    Asthma Neg Hx    Cancer Neg Hx    COPD Neg Hx    Depression Neg Hx    Diabetes Neg Hx    Drug abuse Neg Hx    Early death Neg Hx    Hearing loss Neg Hx    Heart disease Neg Hx    Hyperlipidemia Neg Hx    Kidney disease Neg Hx    Learning disabilities Neg Hx    Mental illness Neg Hx    Mental retardation Neg Hx    Miscarriages / Stillbirths Neg Hx    Vision loss Neg Hx    Stroke Neg Hx     Social History Social History   Tobacco Use   Smoking status: Never   Smokeless tobacco: Never  Vaping Use   Vaping status: Never Used  Substance Use Topics   Alcohol use: Never   Drug use: No      Allergies   No known allergies   Review of Systems Review of Systems  Genitourinary:  Positive for frequency.     Physical Exam Triage Vital Signs ED Triage Vitals [06/24/23 1910]  Encounter Vitals Group     BP 119/83     Systolic BP Percentile      Diastolic BP Percentile      Pulse Rate 82     Resp 18     Temp 99 F (37.2 C)     Temp Source Oral     SpO2 98 %     Weight      Height      Head Circumference      Peak Flow      Pain Score 2     Pain Loc      Pain Education      Exclude from Growth Chart    No data found.  Updated Vital Signs BP 119/83 (BP Location: Right Arm)   Pulse 82   Temp 99 F (37.2 C) (Oral)   Resp 18   LMP 06/24/2023   SpO2 98%   Visual Acuity Right Eye Distance:   Left Eye Distance:   Bilateral Distance:    Right Eye Near:   Left Eye Near:    Bilateral Near:     Physical Exam General Appearance: Alert, well developed, well nourished, cooperative Head: Normocephalic, without obvious abnormality, atraumatic Respiratory: Respirations even and unlabored, normal respiratory rate Heart: rate and rhythm normal.   CVA:  No flank pain Extremities: No gross deformities Skin: Skin color, texture, turgor normal. No rashes seen  Psych: Appropriate mood and affect.   UC Treatments / Results  Labs (all labs ordered are listed, but only abnormal results are displayed) Labs Reviewed  POCT URINALYSIS DIP (MANUAL ENTRY) - Abnormal; Notable for the following components:      Result Value   Spec Grav, UA >=1.030 (*)    Blood, UA small (*)    Nitrite, UA Positive (*)    All other components within normal limits  POCT URINE PREGNANCY - Normal  URINE CULTURE  CERVICOVAGINAL ANCILLARY ONLY    EKG   Radiology No results found.  Procedures Procedures (including critical care time)  Medications Ordered in UC Medications - No data to display  Initial Impression / Assessment and Plan / UC Course  I have reviewed the  triage  vital signs and the nursing notes.  Pertinent labs & imaging results that were available during my care of the patient were reviewed by me and considered in my medical decision making (see chart for details).      UA abnormal, positive for nitrates and small blood but no leukocytes present on point-of-care UA.  Urine culture pending. Has symptoms consistent with a UTI however has a history of prior STDs therefore obtain a cervical vaginal cytology swab to rule out STDs as a source of symptoms.  Patient has had a recent UTI approximately 6 to 7 months ago did not complete antibiotics however has been treated with doxycycline  since that time.  Given difficulty in swallowing capsules and pills prescribed doxycycline  twice daily for 5 days only while we await urine culture and vaginal cytology swab.  If patient is positive for chlamydia she will require an additional 2 days of doxycycline  and if UTI is positive review urine sensitivity to ensure that tetracycline susceptible to infection.  Encouraged to continue to hydrate well with fluids. Final Clinical Impressions(s) / UC Diagnoses   Final diagnoses:  Dysuria  Screen for STD (sexually transmitted disease)     Discharge Instructions      Urine culture pending and will result within 2-3 days. Other labs will result within 24-48 hours      ED Prescriptions     Medication Sig Dispense Auth. Provider   doxycycline  (VIBRAMYCIN ) 100 MG capsule Take 1 capsule (100 mg total) by mouth 2 (two) times daily for 5 days. 10 capsule Arloa Suzen RAMAN, NP      PDMP not reviewed this encounter.   Arloa Suzen RAMAN, NP 06/25/23 [phone]

## 2023-06-24 NOTE — ED Triage Notes (Signed)
 Pt reports urinary odor, cloudy urine, frequency- some dysuria x 1 week

## 2023-06-24 NOTE — Discharge Instructions (Signed)
 Urine culture pending and will result within 2-3 days. Other labs will result within 24-48 hours

## 2023-06-25 ENCOUNTER — Telehealth: Payer: Self-pay | Admitting: Family Medicine

## 2023-06-25 DIAGNOSIS — B9689 Other specified bacterial agents as the cause of diseases classified elsewhere: Secondary | ICD-10-CM

## 2023-06-25 LAB — CERVICOVAGINAL ANCILLARY ONLY
Bacterial Vaginitis (gardnerella): POSITIVE — AB
Candida Glabrata: NEGATIVE
Candida Vaginitis: NEGATIVE
Chlamydia: NEGATIVE
Comment: NEGATIVE
Comment: NEGATIVE
Comment: NEGATIVE
Comment: NEGATIVE
Comment: NEGATIVE
Comment: NORMAL
Neisseria Gonorrhea: NEGATIVE
Trichomonas: NEGATIVE

## 2023-06-25 MED ORDER — METRONIDAZOLE 0.75 % VA GEL: 1.0000 | Freq: Every day | VAGINAL | 0 refills | Status: DC

## 2023-06-25 NOTE — Telephone Encounter (Signed)
 Patient is positive for BV.  Patient called today requesting treatment.  Prescribed MetroGel.

## 2023-06-26 ENCOUNTER — Telehealth: Payer: Self-pay | Admitting: Emergency Medicine

## 2023-06-26 LAB — URINE CULTURE
Culture: >=100000 — AB
Special Requests: NORMAL

## 2023-06-26 NOTE — Telephone Encounter (Signed)
 Pt had questions about medication for UTI and BV. Reviewed the doxycycline and metrogel that had been sent out for pt. Pt reports had not picked up the medications yet but will do so and start them today. Also encouraged regular fluids and hydration.

## 2023-06-27 ENCOUNTER — Telehealth (HOSPITAL_COMMUNITY): Payer: Self-pay | Admitting: Emergency Medicine

## 2023-06-27 MED ORDER — CEFDINIR 300 MG PO CAPS: 300.0000 mg | ORAL_CAPSULE | Freq: Two times a day (BID) | ORAL | 0 refills | Status: DC

## 2023-06-27 NOTE — Telephone Encounter (Signed)
 Cefdinir for urine culture results, pe rAshlee. APP

## 2023-06-28 ENCOUNTER — Telehealth: Payer: Self-pay | Admitting: Physician Assistant

## 2023-06-28 MED ORDER — CEFDINIR 250 MG/5ML PO SUSR: 300.0000 mg | Freq: Two times a day (BID) | ORAL | 0 refills | Status: AC

## 2023-06-28 NOTE — Telephone Encounter (Signed)
 Patient contacted [place] indicating that she is unable to swallow pills.  Liquid formulation of cefdinir sent based on culture results.

## 2023-06-28 NOTE — Telephone Encounter (Signed)
 Patient notified that liquid form of antibiotic sent to [facility] on [address]. Verbalized understanding

## 2023-07-09 ENCOUNTER — Other Ambulatory Visit: Payer: Self-pay

## 2023-07-09 ENCOUNTER — Ambulatory Visit
Admission: RE | Admit: 2023-07-09 | Discharge: 2023-07-09 | Disposition: A | Discharge status: Home / Self Care | Payer: Medicaid Other | Source: Ambulatory Visit | Attending: Family Medicine | Admitting: Family Medicine

## 2023-07-09 VITALS — BP 118/73 | HR 84 | Temp 97.8°F | Resp 18

## 2023-07-09 DIAGNOSIS — N898 Other specified noninflammatory disorders of vagina: Secondary | ICD-10-CM | POA: Diagnosis present

## 2023-07-09 MED ORDER — FLUCONAZOLE 150 MG PO TABS: ORAL_TABLET | ORAL | 0 refills | Status: DC

## 2023-07-09 NOTE — ED Triage Notes (Signed)
Pt here for vaginal discharge that she thinks may be a yeast infection; pt recently treated for BV

## 2023-07-10 NOTE — ED Provider Notes (Signed)
[facility]   [id number] 07/09/23 Arrival Time: 1847  ASSESSMENT & PLAN:  1. Vaginal discharge    Meds ordered this encounter  Medications   fluconazole (DIFLUCAN) 150 MG tablet    Sig: Take one tablet by mouth as a single dose. May repeat in 3 days if symptoms persist.    Dispense:  2 tablet    Refill:  0   Vaginal cytology pending.  Without s/s of PID.  Labs Reviewed  CERVICOVAGINAL ANCILLARY ONLY   Will notify of any positive results. Instructed to refrain from sexual activity for at least seven days.  Reviewed expectations re: course of current medical issues. Questions answered. Outlined signs and symptoms indicating need for more acute intervention. Patient verbalized understanding. After Visit Summary given.   SUBJECTIVE:  Amy Mclean is a 33 y.o. female who presents with complaint of vaginal discharge; thinks may be a yeast infection; pt recently treated for BV. Would like STI screening.   Patient's last menstrual period was 06/24/2023.   OBJECTIVE:  Vitals:   07/09/23 1919  BP: 118/73  Pulse: 84  Resp: 18  Temp: 97.8 F (36.6 C)  TempSrc: Oral  SpO2: 97%     General appearance: alert, cooperative, appears stated age and no distress Lungs: unlabored respirations; speaks full sentences without difficulty Back: no CVA tenderness; FROM at waist Abdomen: soft, non-tender GU: deferred Skin: warm and dry Psychological: alert and cooperative; normal mood and affect.    Labs Reviewed  CERVICOVAGINAL ANCILLARY ONLY    Allergies  Allergen Reactions   No Known Allergies     Past Medical History:  Diagnosis Date   Adnexal mass 12/30/2015   Anemia    Anxiety    Chlamydia    High blood pressure    History of dysphagia    History of severe postpartum preeclampsia in prior pregnancy, currently pregnant 09/01/2014   Hx of varicella    Left ovarian cyst    Trichomonas vaginitis 2017   Urinary tract infection    Family  History  Problem Relation Age of Onset   Hypertension Father    Hypertension Maternal Grandmother    Alcohol abuse Neg Hx    Arthritis Neg Hx    Birth defects Neg Hx    Asthma Neg Hx    Cancer Neg Hx    COPD Neg Hx    Depression Neg Hx    Diabetes Neg Hx    Drug abuse Neg Hx    Early death Neg Hx    Hearing loss Neg Hx    Heart disease Neg Hx    Hyperlipidemia Neg Hx    Kidney disease Neg Hx    Learning disabilities Neg Hx    Mental illness Neg Hx    Mental retardation Neg Hx    Miscarriages / Stillbirths Neg Hx    Vision loss Neg Hx    Stroke Neg Hx    Social History   Socioeconomic History   Marital status: Single    Spouse name: Not on file   Number of children: Not on file   Years of education: Not on file   Highest education level: Not on file  Occupational History   Not on file  Tobacco Use   Smoking status: Never   Smokeless tobacco: Never  Vaping Use   Vaping status: Never Used  Substance and Sexual Activity   Alcohol use: Never   Drug use: No   Sexual activity: Yes    Birth  control/protection: None  Other Topics Concern   Not on file  Social History Narrative   ** Merged History Encounter **       Social Drivers of Corporate investment banker Strain: Not on file  Food Insecurity: Not on file  Transportation Needs: Not on file  Physical Activity: Not on file  Stress: Not on file  Social Connections: Not on file  Intimate Partner Violence: Not on file           Mardella Layman, MD 07/10/23 1339

## 2023-07-11 LAB — CERVICOVAGINAL ANCILLARY ONLY
Bacterial Vaginitis (gardnerella): POSITIVE — AB
Candida Glabrata: POSITIVE — AB
Candida Vaginitis: NEGATIVE
Chlamydia: NEGATIVE
Comment: NEGATIVE
Comment: NEGATIVE
Comment: NEGATIVE
Comment: NEGATIVE
Comment: NEGATIVE
Comment: NORMAL
Neisseria Gonorrhea: NEGATIVE
Trichomonas: NEGATIVE

## 2023-07-12 ENCOUNTER — Telehealth (HOSPITAL_BASED_OUTPATIENT_CLINIC_OR_DEPARTMENT_OTHER): Payer: Self-pay

## 2023-07-12 MED ORDER — METRONIDAZOLE 0.75 % VA GEL: 1.0000 | Freq: Every day | VAGINAL | 0 refills | Status: DC

## 2023-07-12 NOTE — Telephone Encounter (Signed)
Per protocol, pt requires tx with metronidazole. Reviewed with patient, verified pharmacy, prescription sent.

## 2023-08-09 ENCOUNTER — Ambulatory Visit
Admission: RE | Admit: 2023-08-09 | Discharge: 2023-08-09 | Disposition: A | Discharge status: Home / Self Care | Payer: Medicaid Other | Source: Ambulatory Visit | Attending: Physician Assistant | Admitting: Physician Assistant

## 2023-08-09 ENCOUNTER — Other Ambulatory Visit: Payer: Self-pay

## 2023-08-09 VITALS — BP 121/84 | HR 90 | Temp 97.8°F | Resp 16

## 2023-08-09 DIAGNOSIS — N390 Urinary tract infection, site not specified: Secondary | ICD-10-CM | POA: Diagnosis present

## 2023-08-09 DIAGNOSIS — R300 Dysuria: Secondary | ICD-10-CM | POA: Diagnosis present

## 2023-08-09 DIAGNOSIS — N898 Other specified noninflammatory disorders of vagina: Secondary | ICD-10-CM | POA: Insufficient documentation

## 2023-08-09 LAB — POCT URINALYSIS DIP (MANUAL ENTRY)
Bilirubin, UA: NEGATIVE
Blood, UA: NEGATIVE
Glucose, UA: NEGATIVE mg/dL
Ketones, POC UA: NEGATIVE mg/dL
Nitrite, UA: POSITIVE — AB
Spec Grav, UA: >=1.03 — AB (ref 1.010–1.025)
Urobilinogen, UA: 0.2 U/dL
pH, UA: 5.5 (ref 5.0–8.0)

## 2023-08-09 MED ORDER — CEPHALEXIN 250 MG/5ML PO SUSR: 500.0000 mg | Freq: Three times a day (TID) | ORAL | 0 refills | Status: AC

## 2023-08-09 NOTE — ED Provider Notes (Signed)
[facility]    CSN: [id number] Arrival date & time: 08/09/23  1506      History   Chief Complaint Chief Complaint  Patient presents with   Urinary Frequency    UTI symptoms - Entered by patient    HPI Amy Mclean is a 33 y.o. female.   Patient presents today with a 4-day history of UTI symptoms.  She reports frequency, urgency, dysuria.  She frequently gets these symptoms whenever she has intercourse and reports that she had a sexual encounter last week.  She has had some mild discharge but treated this with boric acid and reports that it is no longer experiencing a significant odor.  She continues to have some mild discharge and is interested in STI testing as well as for BV and yeast.  She denies any pelvic pain.  Denies any recent urogenital procedure, denies any recent antibiotics outside of metronidazole that was prescribed for BV in January 2025.  She is confident that she is not pregnant.  She denies any recent catheterization or history of nephrolithiasis.  She has never seen a urologist.  Denies any fever, nausea, vomiting.    Past Medical History:  Diagnosis Date   Adnexal mass 12/30/2015   Anemia    Anxiety    Chlamydia    High blood pressure    History of dysphagia    History of severe postpartum preeclampsia in prior pregnancy, currently pregnant 09/01/2014   Hx of varicella    Left ovarian cyst    Trichomonas vaginitis 2017   Urinary tract infection     Patient Active Problem List   Diagnosis Date Noted   Anemia 01/03/2017    Past Surgical History:  Procedure Laterality Date   LAPAROSCOPY N/A 12/30/2015   Procedure: LAPAROSCOPY DIAGNOSTIC;  Surgeon: Sherian Rein, MD;  Location: [facility];  Service: Gynecology;  Laterality: N/A;    OB History     Gravida  9   Para  4   Term  4   Preterm  0   AB  5   Living  4      SAB  2   IAB  3   Ectopic  0   Multiple      Live Births  4            Home  Medications    Prior to Admission medications   Medication Sig Start Date End Date Taking? Authorizing Provider  cephALEXin (KEFLEX) 250 MG/5ML suspension Take 10 mLs (500 mg total) by mouth 3 (three) times daily for 5 days. 08/09/23 08/14/23 Yes Demonta Wombles, Noberto Retort, PA-C    Family History Family History  Problem Relation Age of Onset   Hypertension Father    Hypertension Maternal Grandmother    Alcohol abuse Neg Hx    Arthritis Neg Hx    Birth defects Neg Hx    Asthma Neg Hx    Cancer Neg Hx    COPD Neg Hx    Depression Neg Hx    Diabetes Neg Hx    Drug abuse Neg Hx    Early death Neg Hx    Hearing loss Neg Hx    Heart disease Neg Hx    Hyperlipidemia Neg Hx    Kidney disease Neg Hx    Learning disabilities Neg Hx    Mental illness Neg Hx    Mental retardation Neg Hx    Miscarriages / Stillbirths Neg Hx    Vision loss Neg Hx  Stroke Neg Hx     Social History Social History   Tobacco Use   Smoking status: Never   Smokeless tobacco: Never  Vaping Use   Vaping status: Never Used  Substance Use Topics   Alcohol use: Never   Drug use: No     Allergies   No known allergies   Review of Systems Review of Systems  Constitutional:  Negative for activity change, appetite change, fatigue and fever.  Gastrointestinal:  Negative for abdominal pain, diarrhea, nausea and vomiting.  Genitourinary:  Positive for dysuria, frequency, urgency and vaginal discharge. Negative for flank pain, hematuria, vaginal bleeding and vaginal pain.  Musculoskeletal:  Negative for arthralgias, back pain and myalgias.     Physical Exam Triage Vital Signs ED Triage Vitals  Encounter Vitals Group     BP 08/09/23 1524 121/84     Systolic BP Percentile --      Diastolic BP Percentile --      Pulse Rate 08/09/23 1524 90     Resp 08/09/23 1524 16     Temp 08/09/23 1524 97.8 F (36.6 C)     Temp src --      SpO2 08/09/23 1524 99 %     Weight --      Height --      Head Circumference --       Peak Flow --      Pain Score 08/09/23 1523 0     Pain Loc --      Pain Education --      Exclude from Growth Chart --    No data found.  Updated Vital Signs BP 121/84 (BP Location: Left Arm)   Pulse 90   Temp 97.8 F (36.6 C)   Resp 16   LMP 07/27/2023   SpO2 99%   Visual Acuity Right Eye Distance:   Left Eye Distance:   Bilateral Distance:    Right Eye Near:   Left Eye Near:    Bilateral Near:     Physical Exam Vitals reviewed.  Constitutional:      General: She is awake. She is not in acute distress.    Appearance: Normal appearance. She is well-developed. She is not ill-appearing.     Comments: Very pleasant female appears stated age in no acute distress sitting comfortably in exam room  HENT:     Head: Normocephalic and atraumatic.  Cardiovascular:     Rate and Rhythm: Normal rate and regular rhythm.     Heart sounds: Normal heart sounds, S1 normal and S2 normal. No murmur heard. Pulmonary:     Effort: Pulmonary effort is normal.     Breath sounds: Normal breath sounds. No wheezing, rhonchi or rales.     Comments: Clear to auscultation bilaterally Abdominal:     General: Bowel sounds are normal.     Palpations: Abdomen is soft.     Tenderness: There is no abdominal tenderness. There is no right CVA tenderness, left CVA tenderness, guarding or rebound.     Comments: Benign abdominal exam  Genitourinary:    Comments: Exam deferred Psychiatric:        Behavior: Behavior is cooperative.      UC Treatments / Results  Labs (all labs ordered are listed, but only abnormal results are displayed) Labs Reviewed  POCT URINALYSIS DIP (MANUAL ENTRY) - Abnormal; Notable for the following components:      Result Value   Clarity, UA hazy (*)    Spec Grav, UA >=1.030 (*)  Protein Ur, POC trace (*)    Nitrite, UA Positive (*)    Leukocytes, UA Trace (*)    All other components within normal limits  URINE CULTURE  CERVICOVAGINAL ANCILLARY ONLY     EKG   Radiology No results found.  Procedures Procedures (including critical care time)  Medications Ordered in UC Medications - No data to display  Initial Impression / Assessment and Plan / UC Course  I have reviewed the triage vital signs and the nursing notes.  Pertinent labs & imaging results that were available during my care of the patient were reviewed by me and considered in my medical decision making (see chart for details).     Patient is well-appearing, afebrile, nontoxic, nontachycardic.  Vital signs and physical exam are reassuring with no indication for emergent evaluation or imaging.  UA was consistent with UTI.  Will start cephalexin 500 mg 3 times daily.  Patient requested liquid formulation.  Will send this for urine culture and contact her if we need to change or discontinue her antibiotics based on her culture results.  STI swab was collected but will defer treatment until swab results are available.  She was encouraged to push fluids.  We discussed UTI prevention strategies including cranberry juice, pushing fluids, urination after intercourse to which she expressed understanding.  We discussed that if she continues to have recurrent symptoms she may benefit from seeing a urologist I recommend she follow-up closely with her primary care.  If anything worsens and she has pelvic pain, abdominal pain, fever, nausea, vomiting she is to be seen immediately.  Strict return precautions given.  Excuse note provided.  Final Clinical Impressions(s) / UC Diagnoses   Final diagnoses:  Acute UTI  Dysuria  Vaginal discharge     Discharge Instructions      We are treating you for urinary tract infection.  Start cephalexin 3 times daily for 5 days.  We will contact you if we need to stop or change her antibiotics based on your culture results.  We will also contact you if your swab indicates we need to arrange treatment.  Make sure that you are resting and drinking plenty  of fluid.  Urinate after intercourse to prevent UTI.  If anything worsens and you have pelvic pain, abdominal pain, fever, nausea, vomiting you need to be seen immediately.     ED Prescriptions     Medication Sig Dispense Auth. Provider   cephALEXin (KEFLEX) 250 MG/5ML suspension Take 10 mLs (500 mg total) by mouth 3 (three) times daily for 5 days. 150 mL Sharronda Schweers K, PA-C      PDMP not reviewed this encounter.   Jeani Hawking, PA-C 08/09/23 1550

## 2023-08-09 NOTE — ED Triage Notes (Signed)
Pt states "every time I have sexual intercourse I get symptoms of a UTI" Reports intermittent vaginal discharge. States she is using boric acid supp for her symptoms

## 2023-08-09 NOTE — Discharge Instructions (Signed)
We are treating you for urinary tract infection.  Start cephalexin 3 times daily for 5 days.  We will contact you if we need to stop or change her antibiotics based on your culture results.  We will also contact you if your swab indicates we need to arrange treatment.  Make sure that you are resting and drinking plenty of fluid.  Urinate after intercourse to prevent UTI.  If anything worsens and you have pelvic pain, abdominal pain, fever, nausea, vomiting you need to be seen immediately.

## 2023-08-11 LAB — URINE CULTURE: Culture: >=100000 — AB

## 2023-08-12 LAB — CERVICOVAGINAL ANCILLARY ONLY
Bacterial Vaginitis (gardnerella): POSITIVE — AB
Candida Glabrata: NEGATIVE
Candida Vaginitis: POSITIVE — AB
Chlamydia: NEGATIVE
Comment: NEGATIVE
Comment: NEGATIVE
Comment: NEGATIVE
Comment: NEGATIVE
Comment: NEGATIVE
Comment: NORMAL
Neisseria Gonorrhea: NEGATIVE
Trichomonas: NEGATIVE

## 2023-08-13 ENCOUNTER — Telehealth (HOSPITAL_COMMUNITY): Payer: Self-pay

## 2023-08-13 MED ORDER — FLUCONAZOLE 150 MG PO TABS: 150.0000 mg | ORAL_TABLET | Freq: Once | ORAL | 0 refills | Status: AC

## 2023-08-13 MED ORDER — METRONIDAZOLE 500 MG PO TABS: 500.0000 mg | ORAL_TABLET | Freq: Two times a day (BID) | ORAL | 0 refills | Status: AC

## 2023-08-13 NOTE — Telephone Encounter (Signed)
 Per protocol, pt requires tx with metronidazole and Diflucan.  Rx sent to pharmacy on file.

## 2023-09-25 ENCOUNTER — Other Ambulatory Visit: Payer: Self-pay

## 2023-09-25 DIAGNOSIS — B3731 Acute candidiasis of vulva and vagina: Secondary | ICD-10-CM

## 2023-09-25 MED ORDER — FLUCONAZOLE 150 MG PO TABS: 150.0000 mg | ORAL_TABLET | Freq: Once | ORAL | 1 refills | Status: AC

## 2023-09-25 NOTE — Progress Notes (Signed)
 TC from pt, experiencing yeast infection symptoms, rx sent per protocol

## 2023-10-01 ENCOUNTER — Encounter: Payer: Self-pay | Admitting: Emergency Medicine

## 2023-10-01 ENCOUNTER — Ambulatory Visit
Admission: RE | Admit: 2023-10-01 | Discharge: 2023-10-01 | Disposition: A | Discharge status: Home / Self Care | Source: Ambulatory Visit | Attending: Family Medicine | Admitting: Family Medicine

## 2023-10-01 VITALS — BP 119/81 | HR 83 | Temp 98.8°F | Resp 16

## 2023-10-01 DIAGNOSIS — N760 Acute vaginitis: Secondary | ICD-10-CM | POA: Insufficient documentation

## 2023-10-01 DIAGNOSIS — Z113 Encounter for screening for infections with a predominantly sexual mode of transmission: Secondary | ICD-10-CM | POA: Insufficient documentation

## 2023-10-01 DIAGNOSIS — B9689 Other specified bacterial agents as the cause of diseases classified elsewhere: Secondary | ICD-10-CM | POA: Insufficient documentation

## 2023-10-01 LAB — POCT URINALYSIS DIP (MANUAL ENTRY)
Bilirubin, UA: NEGATIVE
Blood, UA: NEGATIVE
Glucose, UA: NEGATIVE mg/dL
Ketones, POC UA: NEGATIVE mg/dL
Leukocytes, UA: NEGATIVE
Nitrite, UA: NEGATIVE
Protein Ur, POC: NEGATIVE mg/dL
Spec Grav, UA: 1.015 (ref 1.010–1.025)
Urobilinogen, UA: 0.2 U/dL
pH, UA: 7.5 (ref 5.0–8.0)

## 2023-10-01 LAB — POCT URINE PREGNANCY: Preg Test, Ur: NEGATIVE

## 2023-10-01 MED ORDER — FLUCONAZOLE 150 MG PO TABS: 150.0000 mg | ORAL_TABLET | ORAL | 0 refills | Status: AC | PRN

## 2023-10-01 MED ORDER — METRONIDAZOLE 0.75 % VA GEL: Freq: Every day | VAGINAL | 0 refills | Status: DC

## 2023-10-01 NOTE — ED Provider Notes (Signed)
 Amy Mclean    CSN: [id number] Arrival date & time: 10/01/23  1840      History   Chief Complaint Chief Complaint  Patient presents with   Vaginal Discharge    Entered by patient    HPI Amy Mclean is a 33 y.o. female.    Patient here for evaluation of STD Testing. Reports that she engaged in sexual intercourse reports condom broke and the following day experiencing vaginal irritation and discomfort.  She is concerned that she may have an STD.  Denies any dysuria.  Last menstrual period was 09/23/2023.  Denies any other concerns.  Past Medical History:  Diagnosis Date   Adnexal mass 12/30/2015   Anemia    Anxiety    Chlamydia    High blood pressure    History of dysphagia    History of severe postpartum preeclampsia in prior pregnancy, currently pregnant 09/01/2014   Hx of varicella    Left ovarian cyst    Trichomonas vaginitis 2017   Urinary tract infection     Patient Active Problem List   Diagnosis Date Noted   Anemia 01/03/2017    Past Surgical History:  Procedure Laterality Date   LAPAROSCOPY N/A 12/30/2015   Procedure: LAPAROSCOPY DIAGNOSTIC;  Surgeon: Sherian Rein, MD;  Location: [facility];  Service: Gynecology;  Laterality: N/A;    OB History     Gravida  9   Para  4   Term  4   Preterm  0   AB  5   Living  4      SAB  2   IAB  3   Ectopic  0   Multiple      Live Births  4            Home Medications    Prior to Admission medications   Medication Sig Start Date End Date Taking? Authorizing Provider  fluconazole (DIFLUCAN) 150 MG tablet Take 1 tablet (150 mg total) by mouth every three (3) days as needed (vaginitis). 10/01/23   Bing Neighbors, NP  metroNIDAZOLE (METROGEL) 0.75 % vaginal gel Place vaginally at bedtime for 5 days. 10/01/23 10/06/23  Bing Neighbors, NP    Family History Family History  Problem Relation Age of Onset   Hypertension Father    Hypertension Maternal Grandmother     Alcohol abuse Neg Hx    Arthritis Neg Hx    Birth defects Neg Hx    Asthma Neg Hx    Cancer Neg Hx    COPD Neg Hx    Depression Neg Hx    Diabetes Neg Hx    Drug abuse Neg Hx    Early death Neg Hx    Hearing loss Neg Hx    Heart disease Neg Hx    Hyperlipidemia Neg Hx    Kidney disease Neg Hx    Learning disabilities Neg Hx    Mental illness Neg Hx    Mental retardation Neg Hx    Miscarriages / Stillbirths Neg Hx    Vision loss Neg Hx    Stroke Neg Hx     Social History Social History   Tobacco Use   Smoking status: Never   Smokeless tobacco: Never  Vaping Use   Vaping status: Never Used  Substance Use Topics   Alcohol use: Never   Drug use: No     Allergies   Patient has no known allergies.   Review of Systems Review of Systems  Genitourinary:  Positive for vaginal discharge.     Physical Exam Triage Vital Signs ED Triage Vitals  Encounter Vitals Group     BP 10/01/23 1920 119/81     Systolic BP Percentile --      Diastolic BP Percentile --      Pulse Rate 10/01/23 1920 83     Resp 10/01/23 1920 16     Temp 10/01/23 1920 98.8 F (37.1 C)     Temp Source 10/01/23 1920 Oral     SpO2 10/01/23 1920 100 %     Weight --      Height --      Head Circumference --      Peak Flow --      Pain Score 10/01/23 1918 0     Pain Loc --      Pain Education --      Exclude from Growth Chart --    No data found.  Updated Vital Signs BP 119/81 (BP Location: Left Arm)   Pulse 83   Temp 98.8 F (37.1 C) (Oral)   Resp 16   LMP 09/23/2023 (Approximate)   SpO2 100%   Visual Acuity Right Eye Distance:   Left Eye Distance:   Bilateral Distance:    Right Eye Near:   Left Eye Near:    Bilateral Near:     Physical Exam Vitals reviewed.  Constitutional:      Appearance: Normal appearance.  HENT:     Head: Normocephalic and atraumatic.  Cardiovascular:     Rate and Rhythm: Normal rate and regular rhythm.  Pulmonary:     Effort: Pulmonary effort is  normal.     Breath sounds: Normal breath sounds.  Skin:    General: Skin is warm and dry.  Neurological:     Mental Status: She is alert.   Vaginal Cytology Self collected    UC Treatments / Results  Labs (all labs ordered are listed, but only abnormal results are displayed) Labs Reviewed  CERVICOVAGINAL ANCILLARY ONLY - Abnormal; Notable for the following components:      Result Value   Bacterial Vaginitis (gardnerella) Positive (*)    All other components within normal limits  POCT URINE PREGNANCY - Normal  POCT URINALYSIS DIP (MANUAL ENTRY)    EKG   Radiology No results found.  Procedures Procedures (including critical care time)  Medications Ordered in UC Medications - No data to display  Initial Impression / Assessment and Plan / UC Course  I have reviewed the triage vital signs and the nursing notes.  Pertinent labs & imaging results that were available during my care of the patient were reviewed by me and considered in my medical decision making (see chart for details).    General cytology pending.  Patient has a history of recurrent baby and would like to start treatment with MetroGel for probable BV.  Flucaine prescribed for prophylaxis against use due to metronidazole treatment.  Advised that it may be up to 4 to 5 days before cytology results will due to delays in the lab..  Protection until results are known.  Patient verbalized understanding and agreement with plan.  Urine HCG negative. UA unremarkable. Final Clinical Impressions(s) / UC Diagnoses   Final diagnoses:  Screen for STD (sexually transmitted disease)  Vaginitis and vulvovaginitis     Discharge Instructions      Results will be available within 5 days.      ED Prescriptions     Medication Sig  Dispense Auth. Provider   fluconazole (DIFLUCAN) 150 MG tablet Take 1 tablet (150 mg total) by mouth every three (3) days as needed (vaginitis). 2 tablet Buena Carmine, NP   metroNIDAZOLE  (METROGEL) 0.75 % vaginal gel Place vaginally at bedtime for 5 days. 70 g Buena Carmine, NP      PDMP not reviewed this encounter.   Buena Carmine, NP 10/03/23 1028

## 2023-10-01 NOTE — ED Triage Notes (Signed)
 Pt presents wanting STD testing. She says she had intercourse on Saturday and the condom came out of her today. She has smelled a foul odor since Sunday morning.

## 2023-10-01 NOTE — Discharge Instructions (Signed)
 Results will be available within 5 days.

## 2023-10-02 LAB — CERVICOVAGINAL ANCILLARY ONLY
Bacterial Vaginitis (gardnerella): POSITIVE — AB
Candida Glabrata: NEGATIVE
Candida Vaginitis: NEGATIVE
Chlamydia: NEGATIVE
Comment: NEGATIVE
Comment: NEGATIVE
Comment: NEGATIVE
Comment: NEGATIVE
Comment: NEGATIVE
Comment: NORMAL
Neisseria Gonorrhea: NEGATIVE
Trichomonas: NEGATIVE

## 2024-01-01 ENCOUNTER — Ambulatory Visit: Payer: Self-pay

## 2024-02-23 ENCOUNTER — Ambulatory Visit
Admission: EM | Admit: 2024-02-23 | Discharge: 2024-02-23 | Disposition: A | Discharge status: Home / Self Care | Attending: Family Medicine | Admitting: Family Medicine

## 2024-02-23 ENCOUNTER — Other Ambulatory Visit: Payer: Self-pay

## 2024-02-23 DIAGNOSIS — N898 Other specified noninflammatory disorders of vagina: Secondary | ICD-10-CM

## 2024-02-23 DIAGNOSIS — Z113 Encounter for screening for infections with a predominantly sexual mode of transmission: Secondary | ICD-10-CM | POA: Diagnosis present

## 2024-02-23 LAB — POCT URINE DIPSTICK
Bilirubin, UA: NEGATIVE
Blood, UA: NEGATIVE
Glucose, UA: NEGATIVE mg/dL
Ketones, POC UA: NEGATIVE mg/dL
Leukocytes, UA: NEGATIVE
Nitrite, UA: NEGATIVE
POC PROTEIN,UA: NEGATIVE
Spec Grav, UA: >=1.03 — AB (ref 1.010–1.025)
Urobilinogen, UA: 0.2 U/dL
pH, UA: 5.5 (ref 5.0–8.0)

## 2024-02-23 LAB — POCT URINE PREGNANCY: Preg Test, Ur: NEGATIVE

## 2024-02-23 MED ORDER — METRONIDAZOLE 0.75 % VA GEL: 1.0000 | Freq: Every day | VAGINAL | 0 refills | Status: DC

## 2024-02-23 NOTE — ED Provider Notes (Signed)
 [facility]    CSN: [id number] Arrival date & time: 02/23/24  1142      History   Chief Complaint No chief complaint on file.   HPI Amy Mclean is a 33 y.o. female presents for vaginal discharge.  Patient reports some yellow malodorous vaginal discharge for about 2 weeks.  Denies any itching or vaginal rashes.  States last night she had postcoital bleeding that is since resolved.  Does state that the encounter was a rough.  She denies any known STD exposure butDoes have a history of BV and yeast and states the symptoms are consistent with BV.  she would like screening.  No dysuria.  No other concerns at this time  HPI  Past Medical History:  Diagnosis Date   Adnexal mass 12/30/2015   Anemia    Anxiety    Chlamydia    High blood pressure    History of dysphagia    History of severe postpartum preeclampsia in prior pregnancy, currently pregnant 09/01/2014   Hx of varicella    Left ovarian cyst    Trichomonas vaginitis 2017   Urinary tract infection     Patient Active Problem List   Diagnosis Date Noted   Anemia 01/03/2017    Past Surgical History:  Procedure Laterality Date   LAPAROSCOPY N/A 12/30/2015   Procedure: LAPAROSCOPY DIAGNOSTIC;  Surgeon: Ezzie Buba, MD;  Location: [facility];  Service: Gynecology;  Laterality: N/A;    OB History     Gravida  9   Para  4   Term  4   Preterm  0   AB  5   Living  4      SAB  2   IAB  3   Ectopic  0   Multiple      Live Births  4            Home Medications    Prior to Admission medications   Medication Sig Start Date End Date Taking? Authorizing Provider  metroNIDAZOLE  (METROGEL ) 0.75 % vaginal gel Place 1 Applicatorful vaginally at bedtime for 7 days. 02/23/24 03/01/24 Yes Jiali Linney, Jodi R, NP  fluconazole  (DIFLUCAN ) 150 MG tablet Take 1 tablet (150 mg total) by mouth every three (3) days as needed (vaginitis). 10/01/23   Arloa Suzen RAMAN, NP    Family History Family  History  Problem Relation Age of Onset   Hypertension Father    Hypertension Maternal Grandmother    Alcohol abuse Neg Hx    Arthritis Neg Hx    Birth defects Neg Hx    Asthma Neg Hx    Cancer Neg Hx    COPD Neg Hx    Depression Neg Hx    Diabetes Neg Hx    Drug abuse Neg Hx    Early death Neg Hx    Hearing loss Neg Hx    Heart disease Neg Hx    Hyperlipidemia Neg Hx    Kidney disease Neg Hx    Learning disabilities Neg Hx    Mental illness Neg Hx    Mental retardation Neg Hx    Miscarriages / Stillbirths Neg Hx    Vision loss Neg Hx    Stroke Neg Hx     Social History Social History   Tobacco Use   Smoking status: Never   Smokeless tobacco: Never  Vaping Use   Vaping status: Never Used  Substance Use Topics   Alcohol use: Never   Drug use: No  Allergies   Patient has no known allergies.   Review of Systems Review of Systems  Genitourinary:  Positive for vaginal discharge.     Physical Exam Triage Vital Signs ED Triage Vitals  Encounter Vitals Group     BP 02/23/24 1150 115/81     Girls Systolic BP Percentile --      Girls Diastolic BP Percentile --      Boys Systolic BP Percentile --      Boys Diastolic BP Percentile --      Pulse Rate 02/23/24 1150 (!) 101     Resp 02/23/24 1150 16     Temp 02/23/24 1150 98.7 F (37.1 C)     Temp Source 02/23/24 1150 Oral     SpO2 02/23/24 1150 98 %     Weight --      Height --      Head Circumference --      Peak Flow --      Pain Score 02/23/24 1149 0     Pain Loc --      Pain Education --      Exclude from Growth Chart --    No data found.  Updated Vital Signs BP 115/81   Pulse (!) 101   Temp 98.7 F (37.1 C) (Oral)   Resp 16   LMP 02/13/2024   SpO2 98%   Visual Acuity Right Eye Distance:   Left Eye Distance:   Bilateral Distance:    Right Eye Near:   Left Eye Near:    Bilateral Near:     Physical Exam Vitals and nursing note reviewed.  Constitutional:      Appearance: Normal  appearance.  HENT:     Head: Normocephalic and atraumatic.  Eyes:     Pupils: Pupils are equal, round, and reactive to light.  Cardiovascular:     Rate and Rhythm: Normal rate.  Pulmonary:     Effort: Pulmonary effort is normal.  Skin:    General: Skin is warm and dry.  Neurological:     General: No focal deficit present.     Mental Status: She is alert and oriented to person, place, and time.  Psychiatric:        Mood and Affect: Mood normal.        Behavior: Behavior normal.      UC Treatments / Results  Labs (all labs ordered are listed, but only abnormal results are displayed) Labs Reviewed  POCT URINE DIPSTICK - Abnormal; Notable for the following components:      Result Value   Spec Grav, UA >=1.030 (*)    All other components within normal limits  POCT URINE PREGNANCY  CERVICOVAGINAL ANCILLARY ONLY    EKG   Radiology No results found.  Procedures Procedures (including critical care time)  Medications Ordered in UC Medications - No data to display  Initial Impression / Assessment and Plan / UC Course  I have reviewed the triage vital signs and the nursing notes.  Pertinent labs & imaging results that were available during my care of the patient were reviewed by me and considered in my medical decision making (see chart for details).     Reviewed exam and symptoms with patient.  No red flags.  UA and hCG negative.  Vaginal swab/STD testing is ordered we will contact for any positive results.  Patient requested treatment for BV while awaiting results and prefers vaginal inserts, metronidazole  vaginal inserts sent to pharmacy.  Advise rest fluids and  PCP or GYN follow-up if symptoms do not improve.  ER precautions reviewed. Final Clinical Impressions(s) / UC Diagnoses   Final diagnoses:  Vaginal discharge  Screening examination for STD (sexually transmitted disease)     Discharge Instructions      The clinic will contact you with results of the  testing done today are positive.  Start the metronidazole  vaginal inserts as prescribed.  Please follow-up with your PCP or gynecologist if symptoms do not improve.  Please go to the ER for any worsening symptoms.  Hope you feel better soon!     ED Prescriptions     Medication Sig Dispense Auth. Provider   metroNIDAZOLE  (METROGEL ) 0.75 % vaginal gel Place 1 Applicatorful vaginally at bedtime for 7 days. 70 g Clide Remmers, Jodi R, NP      PDMP not reviewed this encounter.   Loreda Myla SAUNDERS, NP 02/23/24 1213

## 2024-02-23 NOTE — Discharge Instructions (Addendum)
 The clinic will contact you with results of the testing done today are positive.  Start the metronidazole  vaginal inserts as prescribed.  Please follow-up with your PCP or gynecologist if symptoms do not improve.  Please go to the ER for any worsening symptoms.  Hope you feel better soon!

## 2024-02-23 NOTE — ED Triage Notes (Signed)
 Pt states she started bleeding from intercourse last night, but already had menstrual last week. PT states prior to starting menstrual she was having yellow vaginal discharge and foul odorx2wks ago. PT wants STI testing.

## 2024-02-24 LAB — CERVICOVAGINAL ANCILLARY ONLY
Bacterial Vaginitis (gardnerella): NEGATIVE
Candida Glabrata: NEGATIVE
Candida Vaginitis: NEGATIVE
Chlamydia: NEGATIVE
Comment: NEGATIVE
Comment: NEGATIVE
Comment: NEGATIVE
Comment: NEGATIVE
Comment: NEGATIVE
Comment: NORMAL
Neisseria Gonorrhea: NEGATIVE
Trichomonas: NEGATIVE

## 2024-03-03 ENCOUNTER — Telehealth: Payer: Self-pay

## 2024-03-04 ENCOUNTER — Ambulatory Visit
Admission: RE | Admit: 2024-03-04 | Discharge: 2024-03-04 | Disposition: A | Discharge status: Home / Self Care | Source: Ambulatory Visit | Attending: Family Medicine | Admitting: Family Medicine

## 2024-03-04 VITALS — BP 149/67 | HR 91 | Temp 98.3°F | Resp 16

## 2024-03-04 DIAGNOSIS — N3001 Acute cystitis with hematuria: Secondary | ICD-10-CM | POA: Diagnosis not present

## 2024-03-04 LAB — POCT URINE DIPSTICK
Bilirubin, UA: NEGATIVE
Glucose, UA: NEGATIVE mg/dL
Nitrite, UA: NEGATIVE
POC PROTEIN,UA: NEGATIVE
Spec Grav, UA: 1.02 (ref 1.010–1.025)
Urobilinogen, UA: 0.2 U/dL
pH, UA: 6 (ref 5.0–8.0)

## 2024-03-04 MED ORDER — CEPHALEXIN 250 MG/5ML PO SUSR: 500.0000 mg | Freq: Two times a day (BID) | ORAL | 0 refills | Status: DC

## 2024-03-04 MED ORDER — CLOTRIMAZOLE 1 % VA CREA: 1.0000 | TOPICAL_CREAM | Freq: Every day | VAGINAL | 0 refills | Status: AC

## 2024-03-04 NOTE — ED Triage Notes (Signed)
 Pt reports strong odor in urine, pain after urinating, cloudy urine x 1 week. Pt has not taken any meds.

## 2024-03-04 NOTE — Discharge Instructions (Addendum)
 Please start cephalexin  to address an urinary tract infection. Use clotrimazole  to treat a yeast infection from using antibiotics. Make sure you hydrate very well with plain water and a quantity of 80 ounces of water a day.  Please limit drinks that are considered urinary irritants such as fruit juices, soda, sweet tea, coffee, artifical sweetened drinks, energy drinks, alcohol.  These can worsen your urinary and genital symptoms but also be the source of them.  I will let you know about your urine culture results through MyChart to see if we need to prescribe or change your antibiotics based off of those results.

## 2024-03-04 NOTE — ED Provider Notes (Signed)
 [facility]  Note:  This document was prepared using Conservation officer, historic buildings and may include unintentional dictation errors.  MRN: [id number] DOB: [birth year]  Subjective:   Amy Mclean is a 33 y.o. female presenting for 1 week history of dysuria, malodorous urine, cloudy urine.  No vaginal discharge.  No concern for pregnancy or STI at this visit.  Declines testing.  Hydrate well with water.  Drinks a lot of urinary irritants.  No current facility-administered medications for this encounter.  Current Outpatient Medications:    fluconazole  (DIFLUCAN ) 150 MG tablet, Take 1 tablet (150 mg total) by mouth every three (3) days as needed (vaginitis)., Disp: 2 tablet, Rfl: 0   metroNIDAZOLE  (METROGEL ) 0.75 % vaginal gel, Place 1 Applicatorful vaginally at bedtime for 7 days., Disp: 70 g, Rfl: 0   No Known Allergies  Past Medical History:  Diagnosis Date   Adnexal mass 12/30/2015   Anemia    Anxiety    Chlamydia    High blood pressure    History of dysphagia    History of severe postpartum preeclampsia in prior pregnancy, currently pregnant 09/01/2014   Hx of varicella    Left ovarian cyst    Trichomonas vaginitis 2017   Urinary tract infection      Past Surgical History:  Procedure Laterality Date   LAPAROSCOPY N/A 12/30/2015   Procedure: LAPAROSCOPY DIAGNOSTIC;  Surgeon: Ezzie Buba, MD;  Location: [facility];  Service: Gynecology;  Laterality: N/A;    Family History  Problem Relation Age of Onset   Hypertension Father    Hypertension Maternal Grandmother    Alcohol abuse Neg Hx    Arthritis Neg Hx    Birth defects Neg Hx    Asthma Neg Hx    Cancer Neg Hx    COPD Neg Hx    Depression Neg Hx    Diabetes Neg Hx    Drug abuse Neg Hx    Early death Neg Hx    Hearing loss Neg Hx    Heart disease Neg Hx    Hyperlipidemia Neg Hx    Kidney disease Neg Hx    Learning disabilities Neg Hx    Mental illness Neg Hx    Mental  retardation Neg Hx    Miscarriages / Stillbirths Neg Hx    Vision loss Neg Hx    Stroke Neg Hx     Social History   Tobacco Use   Smoking status: Never   Smokeless tobacco: Never  Vaping Use   Vaping status: Never Used  Substance Use Topics   Alcohol use: Not Currently   Drug use: Never    ROS   Objective:   Vitals: BP (!) 149/67 (BP Location: Right Arm)   Pulse 91   Temp 98.3 F (36.8 C) (Oral)   Resp 16   LMP 02/16/2024 (Approximate)   SpO2 98%   Physical Exam Constitutional:      General: She is not in acute distress.    Appearance: Normal appearance. She is well-developed. She is not ill-appearing, toxic-appearing or diaphoretic.  HENT:     Head: Normocephalic and atraumatic.     Nose: Nose normal.     Mouth/Throat:     Mouth: Mucous membranes are moist.  Eyes:     General: No scleral icterus.       Right eye: No discharge.        Left eye: No discharge.     Extraocular Movements: Extraocular movements  intact.     Conjunctiva/sclera: Conjunctivae normal.  Cardiovascular:     Rate and Rhythm: Normal rate.  Pulmonary:     Effort: Pulmonary effort is normal.  Abdominal:     General: Bowel sounds are normal. There is no distension.     Palpations: Abdomen is soft. There is no mass.     Tenderness: There is no abdominal tenderness. There is no right CVA tenderness, left CVA tenderness, guarding or rebound.  Skin:    General: Skin is warm and dry.  Neurological:     General: No focal deficit present.     Mental Status: She is alert and oriented to person, place, and time.  Psychiatric:        Mood and Affect: Mood normal.        Behavior: Behavior normal.        Thought Content: Thought content normal.        Judgment: Judgment normal.     Results for orders placed or performed during the hospital encounter of 03/04/24 (from the past 24 hours)  POCT URINE DIPSTICK     Status: Abnormal   Collection Time: 03/04/24  7:19 PM  Result Value Ref Range    Color, UA yellow yellow   Clarity, UA hazy (A) clear   Glucose, UA negative negative mg/dL   Bilirubin, UA negative negative   Ketones, POC UA small (15) (A) negative mg/dL   Spec Grav, UA 8.979 8.989 - 1.025   Blood, UA trace-intact (A) negative   pH, UA 6.0 5.0 - 8.0   POC PROTEIN,UA negative negative, trace   Urobilinogen, UA 0.2 0.2 or 1.0 E.U./dL   Nitrite, UA Negative Negative   Leukocytes, UA Small (1+) (A) Negative    Assessment and Plan :   PDMP not reviewed this encounter.  1. Acute cystitis with hematuria    Patient requested liquid medication.  Start cephalexin  to cover for acute cystitis, urine culture pending.  Recommended aggressive hydration, limiting urinary irritants.  Use clotrimazole  topically for antibiotic associated yeast infection.  Counseled patient on potential for adverse effects with medications prescribed/recommended today, ER and return-to-clinic precautions discussed, patient verbalized understanding.    Christopher Savannah, [place] 03/04/24 1929

## 2024-03-06 LAB — URINE CULTURE: Culture: 70000 — AB

## 2024-05-30 ENCOUNTER — Ambulatory Visit
Admission: EM | Admit: 2024-05-30 | Discharge: 2024-05-30 | Disposition: A | Discharge status: Home / Self Care | Attending: Family Medicine | Admitting: Family Medicine

## 2024-05-30 ENCOUNTER — Other Ambulatory Visit: Payer: Self-pay

## 2024-05-30 DIAGNOSIS — R350 Frequency of micturition: Secondary | ICD-10-CM

## 2024-05-30 DIAGNOSIS — N898 Other specified noninflammatory disorders of vagina: Secondary | ICD-10-CM | POA: Diagnosis not present

## 2024-05-30 DIAGNOSIS — N3000 Acute cystitis without hematuria: Secondary | ICD-10-CM

## 2024-05-30 LAB — POCT URINE DIPSTICK
Bilirubin, UA: NEGATIVE
Blood, UA: NEGATIVE
Glucose, UA: NEGATIVE mg/dL
Nitrite, UA: NEGATIVE
POC PROTEIN,UA: NEGATIVE
Spec Grav, UA: 1.025 (ref 1.010–1.025)
Urobilinogen, UA: 1 U/dL
pH, UA: 6.5 (ref 5.0–8.0)

## 2024-05-30 LAB — POCT URINE PREGNANCY: Preg Test, Ur: NEGATIVE

## 2024-05-30 MED ORDER — CEPHALEXIN 250 MG/5ML PO SUSR: 500.0000 mg | Freq: Two times a day (BID) | ORAL | 0 refills | Status: AC

## 2024-05-30 NOTE — Discharge Instructions (Signed)
 The clinic will contact you with results of the urine culture and vaginal swab done today if positive.  Start Keflex  twice daily for 5 days.  Lots of rest and fluids.  Please follow-up with your PCP if your symptoms do not improve.  Please go to the ER for any worsening symptoms.  Hope you feel better soon!

## 2024-05-30 NOTE — ED Provider Notes (Signed)
 [facility]    CSN: [id number] Arrival date & time: 05/30/24  1508      History   Chief Complaint No chief complaint on file.   HPI Amy Mclean is a 33 y.o. female presents for dysuria.  Patient reports 1 week of urinary frequency with suprapubic pressure.  Denies burning with urination, urgency, hematuria, fevers, vomiting or flank pain.  Has some scant vaginal discharge and would like to get checked for BV and yeast.  No STD concern or exposure.  Has a history of postcoital UTIs.  No OTC medications have been used.  No other concerns at this time  HPI  Past Medical History:  Diagnosis Date   Adnexal mass 12/30/2015   Anemia    Anxiety    Chlamydia    High blood pressure    History of dysphagia    History of severe postpartum preeclampsia in prior pregnancy, currently pregnant 09/01/2014   Hx of varicella    Left ovarian cyst    Trichomonas vaginitis 2017   Urinary tract infection     Patient Active Problem List   Diagnosis Date Noted   Anemia 01/03/2017    Past Surgical History:  Procedure Laterality Date   LAPAROSCOPY N/A 12/30/2015   Procedure: LAPAROSCOPY DIAGNOSTIC;  Surgeon: Ezzie Buba, MD;  Location: [facility];  Service: Gynecology;  Laterality: N/A;    OB History     Gravida  9   Para  4   Term  4   Preterm  0   AB  5   Living  4      SAB  2   IAB  3   Ectopic  0   Multiple      Live Births  4            Home Medications    Prior to Admission medications  Medication Sig Start Date End Date Taking? Authorizing Provider  cephALEXin  (KEFLEX ) 250 MG/5ML suspension Take 10 mLs (500 mg total) by mouth 2 (two) times daily for 5 days. 05/30/24 06/04/24 Yes Mande Auvil, Jodi R, NP  clotrimazole  (GYNE-LOTRIMIN ) 1 % vaginal cream Place 1 Applicatorful vaginally at bedtime. 03/04/24   Christopher Savannah, PA-C  fluconazole  (DIFLUCAN ) 150 MG tablet Take 1 tablet (150 mg total) by mouth every three (3) days as needed  (vaginitis). 10/01/23   Arloa Suzen RAMAN, NP  metroNIDAZOLE  (METROGEL ) 0.75 % vaginal gel Place 1 Applicatorful vaginally at bedtime for 7 days. 02/23/24 03/01/24  Loreda Myla SAUNDERS, NP    Family History Family History  Problem Relation Age of Onset   Hypertension Father    Hypertension Maternal Grandmother    Alcohol abuse Neg Hx    Arthritis Neg Hx    Birth defects Neg Hx    Asthma Neg Hx    Cancer Neg Hx    COPD Neg Hx    Depression Neg Hx    Diabetes Neg Hx    Drug abuse Neg Hx    Early death Neg Hx    Hearing loss Neg Hx    Heart disease Neg Hx    Hyperlipidemia Neg Hx    Kidney disease Neg Hx    Learning disabilities Neg Hx    Mental illness Neg Hx    Mental retardation Neg Hx    Miscarriages / Stillbirths Neg Hx    Vision loss Neg Hx    Stroke Neg Hx     Social History Social History[1]   Allergies  Patient has no known allergies.   Review of Systems Review of Systems  Genitourinary:  Positive for frequency.     Physical Exam Triage Vital Signs ED Triage Vitals  Encounter Vitals Group     BP 05/30/24 1519 120/75     Girls Systolic BP Percentile --      Girls Diastolic BP Percentile --      Boys Systolic BP Percentile --      Boys Diastolic BP Percentile --      Pulse Rate 05/30/24 1519 98     Resp 05/30/24 1519 16     Temp 05/30/24 1519 97.9 F (36.6 C)     Temp Source 05/30/24 1519 Oral     SpO2 05/30/24 1519 98 %     Weight --      Height --      Head Circumference --      Peak Flow --      Pain Score 05/30/24 1518 0     Pain Loc --      Pain Education --      Exclude from Growth Chart --    No data found.  Updated Vital Signs BP 120/75   Pulse 98   Temp 97.9 F (36.6 C) (Oral)   Resp 16   LMP 05/12/2024   SpO2 98%   Visual Acuity Right Eye Distance:   Left Eye Distance:   Bilateral Distance:    Right Eye Near:   Left Eye Near:    Bilateral Near:     Physical Exam Vitals and nursing note reviewed.  Constitutional:       Appearance: Normal appearance.  HENT:     Head: Normocephalic and atraumatic.  Eyes:     Pupils: Pupils are equal, round, and reactive to light.  Cardiovascular:     Rate and Rhythm: Normal rate.  Pulmonary:     Effort: Pulmonary effort is normal.  Skin:    General: Skin is warm and dry.  Neurological:     General: No focal deficit present.     Mental Status: She is alert and oriented to person, place, and time.  Psychiatric:        Mood and Affect: Mood normal.        Behavior: Behavior normal.      UC Treatments / Results  Labs (all labs ordered are listed, but only abnormal results are displayed) Labs Reviewed  POCT URINE DIPSTICK - Abnormal; Notable for the following components:      Result Value   Ketones, POC UA trace (5) (*)    Leukocytes, UA Trace (*)    All other components within normal limits  URINE CULTURE  POCT URINE PREGNANCY  CERVICOVAGINAL ANCILLARY ONLY    EKG   Radiology No results found.  Procedures Procedures (including critical care time)  Medications Ordered in UC Medications - No data to display  Initial Impression / Assessment and Plan / UC Course  I have reviewed the triage vital signs and the nursing notes.  Pertinent labs & imaging results that were available during my care of the patient were reviewed by me and considered in my medical decision making (see chart for details).  UA with trace leuks, will send urine culture and start Keflex  twice daily for 5 days.  Patient requested liquid.  Vaginal swab was ordered will contact for any positive results.  Advise rest fluids and PCP follow-up if symptoms do not improve.  ER precautions reviewed Final Clinical Impressions(s) /  UC Diagnoses   Final diagnoses:  Urinary frequency  Acute cystitis without hematuria  Vaginal discharge     Discharge Instructions      The clinic will contact you with results of the urine culture and vaginal swab done today if positive.  Start Keflex   twice daily for 5 days.  Lots of rest and fluids.  Please follow-up with your PCP if your symptoms do not improve.  Please go to the ER for any worsening symptoms.  Hope you feel better soon!    ED Prescriptions     Medication Sig Dispense Auth. Provider   cephALEXin  (KEFLEX ) 250 MG/5ML suspension Take 10 mLs (500 mg total) by mouth 2 (two) times daily for 5 days. 100 mL Mariam Helbert, Jodi R, NP      PDMP not reviewed this encounter.    [1]  Social History Tobacco Use   Smoking status: Never   Smokeless tobacco: Never  Vaping Use   Vaping status: Never Used  Substance Use Topics   Alcohol use: Not Currently   Drug use: Never     Loreda Myla SAUNDERS, NP 05/30/24 1544

## 2024-05-30 NOTE — ED Triage Notes (Signed)
 Pt c/o cloudy urinex1wk

## 2024-06-01 ENCOUNTER — Ambulatory Visit (HOSPITAL_COMMUNITY): Payer: Self-pay

## 2024-06-01 DIAGNOSIS — N898 Other specified noninflammatory disorders of vagina: Secondary | ICD-10-CM

## 2024-06-01 LAB — CERVICOVAGINAL ANCILLARY ONLY
Bacterial Vaginitis (gardnerella): POSITIVE — AB
Candida Glabrata: NEGATIVE
Candida Vaginitis: NEGATIVE
Comment: NEGATIVE
Comment: NEGATIVE
Comment: NEGATIVE

## 2024-06-01 LAB — URINE CULTURE: Culture: 20000 — AB

## 2024-06-01 MED ORDER — METRONIDAZOLE 500 MG PO TABS: 500.0000 mg | ORAL_TABLET | Freq: Two times a day (BID) | ORAL | 0 refills | Status: DC

## 2024-06-02 ENCOUNTER — Telehealth: Payer: Self-pay

## 2024-06-02 MED ORDER — METRONIDAZOLE 0.75 % VA GEL: 1.0000 | Freq: Every day | VAGINAL | 0 refills | Status: DC

## 2024-06-02 NOTE — Telephone Encounter (Signed)
 Gel sent to [facility]. Trie dto help with Mychart, I was not able to change phone associated with, pt will contact IT.

## 2024-06-02 NOTE — Telephone Encounter (Addendum)
 Patient tested positive for BV infection. Will need treatment with Flagyl . Patient is specifically requesting gel. This will be sent in. Please notify patient.

## 2024-06-30 ENCOUNTER — Ambulatory Visit
Admission: EM | Admit: 2024-06-30 | Discharge: 2024-06-30 | Disposition: A | Discharge status: Home / Self Care | Attending: Family Medicine | Admitting: Family Medicine

## 2024-06-30 DIAGNOSIS — N920 Excessive and frequent menstruation with regular cycle: Secondary | ICD-10-CM | POA: Insufficient documentation

## 2024-06-30 DIAGNOSIS — N898 Other specified noninflammatory disorders of vagina: Secondary | ICD-10-CM | POA: Diagnosis not present

## 2024-06-30 LAB — POCT URINE DIPSTICK
Bilirubin, UA: NEGATIVE
Glucose, UA: NEGATIVE mg/dL
Ketones, POC UA: NEGATIVE mg/dL
Leukocytes, UA: NEGATIVE
Nitrite, UA: NEGATIVE
POC PROTEIN,UA: NEGATIVE
Spec Grav, UA: 1.015
Urobilinogen, UA: 0.2 U/dL
pH, UA: 7

## 2024-06-30 LAB — POCT URINE PREGNANCY: Preg Test, Ur: NEGATIVE

## 2024-06-30 NOTE — ED Provider Notes (Signed)
 " Pro[facility], Television/film[employer] - [facility]  Note:  This document was prepared using Conservation officer, historic buildings and may include unintentional dictation errors.  MRN: [id number] DOB: Aug 19, 1990  Subjective:   Amy Mclean is a 34 y.o. female presenting for 1 week history of persistent vaginal spotting, vaginal irritation and itching.  She cannot appreciate if she has deferred vaginal discharge.  Would like complete vaginal cytology.  She did use Plan B  1 week ago.  Had her cycle about 2-1/2 weeks ago.  Is fairly regular.  Denies fever, n/v, abdominal pain, pelvic pain, dysuria, urinary frequency, hematuria, vaginal discharge.  Is requesting and urine pregnancy test and urinalysis.  Current Outpatient Medications  Medication Instructions   clotrimazole  (GYNE-LOTRIMIN ) 1 % vaginal cream 1 Applicatorful, Vaginal, Daily at bedtime   fluconazole  (DIFLUCAN ) 150 mg, Oral, Every 3 DAYS PRN   metroNIDAZOLE  (METROGEL ) 0.75 % vaginal gel 1 Applicatorful, Vaginal, Daily at bedtime    Allergies[1]  Past Medical History:  Diagnosis Date   Adnexal mass 12/30/2015   Anemia    Anxiety    Chlamydia    High blood pressure    History of dysphagia    History of severe postpartum preeclampsia in prior pregnancy, currently pregnant 09/01/2014   Hx of varicella    Left ovarian cyst    Trichomonas vaginitis 2017   Urinary tract infection      Past Surgical History:  Procedure Laterality Date   LAPAROSCOPY N/A 12/30/2015   Procedure: LAPAROSCOPY DIAGNOSTIC;  Surgeon: Ezzie Buba, MD;  Location: [facility];  Service: Gynecology;  Laterality: N/A;    Family History  Problem Relation Age of Onset   Hypertension Father    Hypertension Maternal Grandmother    Alcohol abuse Neg Hx    Arthritis Neg Hx    Birth defects Neg Hx    Asthma Neg Hx    Cancer Neg Hx    COPD Neg Hx    Depression Neg Hx    Diabetes Neg Hx    Drug abuse Neg Hx    Early death Neg Hx    Hearing loss Neg Hx     Heart disease Neg Hx    Hyperlipidemia Neg Hx    Kidney disease Neg Hx    Learning disabilities Neg Hx    Mental illness Neg Hx    Mental retardation Neg Hx    Miscarriages / Stillbirths Neg Hx    Vision loss Neg Hx    Stroke Neg Hx     Social History   Occupational History   Not on file  Tobacco Use   Smoking status: Never   Smokeless tobacco: Never  Vaping Use   Vaping status: Never Used  Substance and Sexual Activity   Alcohol use: Not Currently   Drug use: Never   Sexual activity: Yes    Birth control/protection: None     ROS   Objective:   Vitals: BP 123/84 (BP Location: Left Arm)   Pulse 91   Temp 99 F (37.2 C) (Oral)   Resp 18   Ht 5' 7 (1.702 m)   Wt 127 lb (57.6 kg)   LMP 06/09/2024   SpO2 100%   BMI 19.89 kg/m   Physical Exam Constitutional:      General: She is not in acute distress.    Appearance: Normal appearance. She is well-developed. She is not ill-appearing, toxic-appearing or diaphoretic.  HENT:     Head: Normocephalic and atraumatic.     Nose:  Nose normal.     Mouth/Throat:     Mouth: Mucous membranes are moist.  Eyes:     General: No scleral icterus.       Right eye: No discharge.        Left eye: No discharge.     Extraocular Movements: Extraocular movements intact.     Conjunctiva/sclera: Conjunctivae normal.  Cardiovascular:     Rate and Rhythm: Normal rate.  Pulmonary:     Effort: Pulmonary effort is normal.  Abdominal:     General: Bowel sounds are normal. There is no distension.     Palpations: Abdomen is soft. There is no mass.     Tenderness: There is no abdominal tenderness. There is no right CVA tenderness, left CVA tenderness, guarding or rebound.  Skin:    General: Skin is warm and dry.  Neurological:     General: No focal deficit present.     Mental Status: She is alert and oriented to person, place, and time.  Psychiatric:        Mood and Affect: Mood normal.        Behavior: Behavior normal.         Thought Content: Thought content normal.        Judgment: Judgment normal.     Results for orders placed or performed during the hospital encounter of 06/30/24 (from the past 24 hours)  POCT urine pregnancy     Status: None   Collection Time: 06/30/24  7:22 PM  Result Value Ref Range   Preg Test, Ur Negative Negative  POCT URINE DIPSTICK     Status: Abnormal   Collection Time: 06/30/24  7:22 PM  Result Value Ref Range   Color, UA yellow yellow   Clarity, UA clear clear   Glucose, UA negative negative mg/dL   Bilirubin, UA negative negative   Ketones, POC UA negative negative mg/dL   Spec Grav, UA 8.984 8.989 - 1.025   Blood, UA trace-intact (A) negative   pH, UA 7.0 5.0 - 8.0   POC PROTEIN,UA negative negative, trace   Urobilinogen, UA 0.2 0.2 or 1.0 E.U./dL   Nitrite, UA Negative Negative   Leukocytes, UA Negative Negative    Assessment and Plan :   PDMP not reviewed this encounter.  1. Vaginal irritation   2. Vaginal discharge   3. Spotting between menses      Will defer urine culture as patient has no true urinary symptoms. Will treat based off of results otherwise. Discussed possible effects of Plan B  as a source of her vaginal spotting, labs pending.      [1] No Known Allergies    Christopher Savannah, PA-C 07/03/24 0818  "

## 2024-06-30 NOTE — ED Triage Notes (Signed)
 Pt states that she has some vaginal bleeding and vaginal itching. X1 week  Pt states that she took a plan B  pill x1 week ago.  Pt is now having abnormal vaginal spotting. Pt states that she also has some vaginal itching after using scented tissue.

## 2024-06-30 NOTE — Discharge Instructions (Addendum)
 Will update you with your test results tomorrow and treat any infection you test positive for. Make sure you hydrate very well with plain water and a quantity of 80 ounces of water a day.  Please limit drinks that are considered urinary irritants such as fruit juices, soda, sweet tea, coffee, artifical sweetened drinks, energy drinks, alcohol.  These can worsen your urinary and genital symptoms but also be the source of them.

## 2024-07-01 ENCOUNTER — Ambulatory Visit (HOSPITAL_COMMUNITY): Payer: Self-pay

## 2024-07-01 LAB — CERVICOVAGINAL ANCILLARY ONLY
Bacterial Vaginitis (gardnerella): NEGATIVE
Candida Glabrata: NEGATIVE
Candida Vaginitis: POSITIVE — AB
Chlamydia: NEGATIVE
Comment: NEGATIVE
Comment: NEGATIVE
Comment: NEGATIVE
Comment: NEGATIVE
Comment: NEGATIVE
Comment: NORMAL
Neisseria Gonorrhea: NEGATIVE
Trichomonas: NEGATIVE

## 2024-07-01 MED ORDER — FLUCONAZOLE 150 MG PO TABS: 150.0000 mg | ORAL_TABLET | Freq: Once | ORAL | 0 refills | Status: AC

## 2024-07-15 ENCOUNTER — Ambulatory Visit (INDEPENDENT_AMBULATORY_CARE_PROVIDER_SITE_OTHER): Payer: Self-pay | Admitting: Obstetrics & Gynecology

## 2024-07-15 ENCOUNTER — Other Ambulatory Visit (HOSPITAL_COMMUNITY)
Admission: RE | Admit: 2024-07-15 | Discharge: 2024-07-15 | Disposition: A | Source: Ambulatory Visit | Attending: Obstetrics & Gynecology | Admitting: Obstetrics & Gynecology

## 2024-07-15 ENCOUNTER — Encounter: Payer: Self-pay | Admitting: Obstetrics & Gynecology

## 2024-07-15 VITALS — BP 118/81 | HR 93 | Ht 67.0 in | Wt 127.0 lb

## 2024-07-15 DIAGNOSIS — Z01419 Encounter for gynecological examination (general) (routine) without abnormal findings: Secondary | ICD-10-CM | POA: Insufficient documentation

## 2024-07-15 NOTE — Progress Notes (Signed)
 GYNECOLOGY CLINIC ANNUAL PREVENTATIVE CARE ENCOUNTER NOTE  Subjective:   Amy Mclean is a 34 y.o. [phone] female here for a routine annual gynecologic exam.  Current complaints: vaginal discharge and itching, odor. She was treated with fluconazole  and her sx returned.   Denies abnormal vaginal bleeding, pelvic pain, problems with intercourse Review of Systems  Constitutional:  Positive for malaise/fatigue.  Respiratory: Negative.    Cardiovascular: Negative.   Gastrointestinal: Negative.   Genitourinary:  Negative for dysuria.     Gynecologic History Patient's last menstrual period was 06/07/2024 (exact date). Contraception: none Last Pap: 03/2024. Results were: normal Last mammogram: n/a  Obstetric History OB History  Gravida Para Term Preterm AB Living  9 4 4  0 5 4  SAB IAB Ectopic Multiple Live Births  2 3 0  4    # Outcome Date GA Lbr Len/2nd Weight Sex Type Anes PTL Lv  9 IAB 2024          8 Term 05/17/17 [redacted]w[redacted]d 05:10 / 00:13 6 lb 13.2 oz (3.096 kg) M Vag-Spont EPI  LIV     Birth Comments: WNL  7 SAB 12/2015          6 IAB 08/2015     TAB     5 Term 08/23/14 [redacted]w[redacted]d 05:15 / 00:34 8 lb (3.629 kg) F Vag-Spont EPI  LIV     Complications: Preeclampsia in postpartum period  4 Term 04/2004 [redacted]w[redacted]d    Vag-Spont   LIV  3 IAB           2 SAB           1 Term      Vag-Spont   LIV    Past Medical History:  Diagnosis Date   Adnexal mass 12/30/2015   Anemia    Anxiety    Chlamydia    High blood pressure    History of dysphagia    History of severe postpartum preeclampsia in prior pregnancy, currently pregnant 09/01/2014   Hx of varicella    Left ovarian cyst    Trichomonas vaginitis 2017   Urinary tract infection     Past Surgical History:  Procedure Laterality Date   LAPAROSCOPY N/A 12/30/2015   Procedure: LAPAROSCOPY DIAGNOSTIC;  Surgeon: Ezzie Buba, MD;  Location: [facility];  Service: Gynecology;  Laterality: N/A;    Medications Ordered Prior to  Encounter[1]  Allergies[2]  Social History   Socioeconomic History   Marital status: Single    Spouse name: Not on file   Number of children: Not on file   Years of education: Not on file   Highest education level: Not on file  Occupational History   Not on file  Tobacco Use   Smoking status: Never   Smokeless tobacco: Never  Vaping Use   Vaping status: Never Used  Substance and Sexual Activity   Alcohol use: Not Currently   Drug use: Never   Sexual activity: Yes    Birth control/protection: None, Condom  Other Topics Concern   Not on file  Social History Narrative   ** Merged History Encounter **       Social Drivers of Health   Tobacco Use: Low Risk (07/15/2024)   Patient History    Smoking Tobacco Use: Never    Smokeless Tobacco Use: Never    Passive Exposure: Not on file  Financial Resource Strain: Not on file  Food Insecurity: Not on file  Transportation Needs: Not on file  Physical Activity: Not on  file  Stress: Not on file  Social Connections: Not on file  Intimate Partner Violence: Not on file  Depression [phone]): Low Risk (07/15/2024)   Depression (PHQ2-9)    PHQ-2 Score: 4  Alcohol Screen: Not on file  Housing: Not on file  Utilities: Not on file  Health Literacy: Not on file    Family History  Problem Relation Age of Onset   Hypertension Father    Hypertension Maternal Grandmother    Alcohol abuse Neg Hx    Arthritis Neg Hx    Birth defects Neg Hx    Asthma Neg Hx    Cancer Neg Hx    COPD Neg Hx    Depression Neg Hx    Diabetes Neg Hx    Drug abuse Neg Hx    Early death Neg Hx    Hearing loss Neg Hx    Heart disease Neg Hx    Hyperlipidemia Neg Hx    Kidney disease Neg Hx    Learning disabilities Neg Hx    Mental illness Neg Hx    Mental retardation Neg Hx    Miscarriages / Stillbirths Neg Hx    Vision loss Neg Hx    Stroke Neg Hx     The following portions of the patient's history were reviewed and updated as appropriate:  allergies, current medications, past family history, past medical history, past social history, past surgical history and problem list.     Objective:  BP 118/81   Pulse 93   Ht 5' 7 (1.702 m)   Wt 127 lb (57.6 kg)   LMP 06/07/2024 (Exact Date)   BMI 19.89 kg/m  CONSTITUTIONAL: Well-developed, well-nourished female in no acute distress.  HENT:  Normocephalic, atraumatic, External right and left ear normal. Oropharynx is clear and moist EYES: Conjunctivae and EOM are normal. Pupils are equal, round, and reactive to light. No scleral icterus.  NECK: Normal range of motion, supple, no masses.  Normal thyroid.  SKIN: Skin is warm and dry. No rash noted. Not diaphoretic. No erythema. No pallor. NEUROLGIC: Alert and oriented to person, place, and time. Normal reflexes, muscle tone coordination. No cranial nerve deficit noted. PSYCHIATRIC: Normal mood and affect. Normal behavior. Normal judgment and thought content. CARDIOVASCULAR: Normal heart rate noted, regular rhythm RESPIRATORY:  Effort and breath sounds normal, no problems with respiration noted. BREASTS: Symmetric in size. No masses, skin changes, nipple drainage, or lymphadenopathy. ABDOMEN: Soft, normal bowel sounds, no distention noted.  No tenderness, rebound or guarding.  PELVIC: Normal appearing external genitalia; normal appearing Grey-yellow discharge.  No abnormal discharge noted.  Pap smear obtained.  Normal uterine size, no other palpable masses, no uterine or adnexal tenderness. MUSCULOSKELETAL: Normal range of motion. No tenderness.  No cyanosis, clubbing, or edema.     Assessment:  Annual gynecologic examination with pap smear   Plan:  Will follow up results of pap smear and manage accordingly. Orders Placed This Encounter  Procedures   CBC   Ambulatory referral to Integrated Behavioral Health    Referral Priority:   Routine    Referral Type:   Consultation    Referral Reason:   Specialty Services Required     Number of Visits Requested:   1  Ancillary cytology swab Due to fatigue will check CBC Routine preventative health maintenance measures emphasized. Please refer to After Visit Summary for other counseling recommendations.    LYNWOOD SOLOMONS, MD Attending Obstetrician & Gynecologist Center for [facility], [facility]     [  1]  Current Outpatient Medications on File Prior to Visit  Medication Sig Dispense Refill   clotrimazole  (GYNE-LOTRIMIN ) 1 % vaginal cream Place 1 Applicatorful vaginally at bedtime. 45 g 0   fluconazole  (DIFLUCAN ) 150 MG tablet Take 1 tablet (150 mg total) by mouth every three (3) days as needed (vaginitis). 2 tablet 0   metroNIDAZOLE  (METROGEL ) 0.75 % vaginal gel Place 1 Applicatorful vaginally at bedtime. 70 g 0   No current facility-administered medications on file prior to visit.  [2] No Known Allergies

## 2024-07-15 NOTE — Progress Notes (Addendum)
 34 y.o. GYN presents for AEX/PAP/STD screening.  C/o vaginal discharge, itching, burning, slight odor. Pt wants Iron levels check, c/o fatigue, feeling cold, eating Ice.  GAD-7=14

## 2024-07-16 ENCOUNTER — Other Ambulatory Visit: Payer: Self-pay | Admitting: Obstetrics & Gynecology

## 2024-07-16 ENCOUNTER — Ambulatory Visit: Payer: Self-pay | Admitting: Obstetrics & Gynecology

## 2024-07-16 DIAGNOSIS — N898 Other specified noninflammatory disorders of vagina: Secondary | ICD-10-CM

## 2024-07-16 DIAGNOSIS — D649 Anemia, unspecified: Secondary | ICD-10-CM

## 2024-07-16 DIAGNOSIS — B3731 Acute candidiasis of vulva and vagina: Secondary | ICD-10-CM

## 2024-07-16 DIAGNOSIS — B9689 Other specified bacterial agents as the cause of diseases classified elsewhere: Secondary | ICD-10-CM

## 2024-07-16 LAB — CERVICOVAGINAL ANCILLARY ONLY
Bacterial Vaginitis (gardnerella): POSITIVE — AB
Candida Glabrata: NEGATIVE
Candida Vaginitis: POSITIVE — AB
Chlamydia: NEGATIVE
Comment: NEGATIVE
Comment: NEGATIVE
Comment: NEGATIVE
Comment: NEGATIVE
Comment: NEGATIVE
Comment: NORMAL
Neisseria Gonorrhea: NEGATIVE
Trichomonas: NEGATIVE

## 2024-07-16 LAB — CBC
Hematocrit: 31.3 % — ABNORMAL LOW (ref 34.0–46.6)
Hemoglobin: 9.4 g/dL — ABNORMAL LOW (ref 11.1–15.9)
MCH: 25.5 pg — ABNORMAL LOW (ref 26.6–33.0)
MCHC: 30 g/dL — ABNORMAL LOW (ref 31.5–35.7)
MCV: 85 fL (ref 79–97)
Platelets: 318 10*3/uL (ref 150–450)
RBC: 3.68 x10E6/uL — ABNORMAL LOW (ref 3.77–5.28)
RDW: 15.4 % (ref 11.7–15.4)
WBC: 8.9 10*3/uL (ref 3.4–10.8)

## 2024-07-16 MED ORDER — FERROUS SULFATE 325 (65 FE) MG PO TABS: 325.0000 mg | ORAL_TABLET | ORAL | 3 refills | Status: AC

## 2024-07-16 NOTE — Progress Notes (Signed)
Meds ordered this encounter  Medications   ferrous sulfate 325 (65 FE) MG tablet    Sig: Take 1 tablet (325 mg total) by mouth every other day.    Dispense:  30 tablet    Refill:  3

## 2024-07-17 LAB — CYTOLOGY - PAP
Adequacy: ABSENT
Comment: NEGATIVE
Diagnosis: UNDETERMINED — AB
High risk HPV: NEGATIVE

## 2024-07-17 MED ORDER — FLUCONAZOLE 150 MG PO TABS: 150.0000 mg | ORAL_TABLET | Freq: Once | ORAL | 0 refills | Status: AC

## 2024-07-17 MED ORDER — METRONIDAZOLE 500 MG PO TABS: 500.0000 mg | ORAL_TABLET | Freq: Two times a day (BID) | ORAL | 0 refills | Status: DC

## 2024-07-17 MED ORDER — METRONIDAZOLE 0.75 % VA GEL: 1.0000 | Freq: Every day | VAGINAL | 0 refills | Status: AC
